# Patient Record
Sex: Male | Born: 1960 | Race: White | Hispanic: No | Marital: Married
Health system: Southern US, Academic
[De-identification: ages and names within clinical notes are randomized; demographics above are authoritative.]

## PROBLEM LIST (undated history)

## (undated) ENCOUNTER — Encounter

## (undated) ENCOUNTER — Ambulatory Visit

## (undated) ENCOUNTER — Ambulatory Visit: Attending: Pharmacist | Primary: Pharmacist

## (undated) ENCOUNTER — Ambulatory Visit: Payer: PRIVATE HEALTH INSURANCE

## (undated) DIAGNOSIS — D649 Anemia, unspecified: Secondary | ICD-10-CM

## (undated) DIAGNOSIS — I82409 Acute embolism and thrombosis of unspecified deep veins of unspecified lower extremity: Secondary | ICD-10-CM

## (undated) DIAGNOSIS — I10 Essential (primary) hypertension: Secondary | ICD-10-CM

---

## 2019-09-11 ENCOUNTER — Ambulatory Visit: Admit: 2019-09-11 | Discharge: 2019-09-12 | Payer: PRIVATE HEALTH INSURANCE

## 2019-09-11 DIAGNOSIS — Z021 Encounter for pre-employment examination: Principal | ICD-10-CM

## 2019-12-08 ENCOUNTER — Ambulatory Visit: Admit: 2019-12-08 | Discharge: 2019-12-09

## 2019-12-08 DIAGNOSIS — Z0189 Encounter for other specified special examinations: Principal | ICD-10-CM

## 2020-05-24 ENCOUNTER — Other Ambulatory Visit: Payer: Self-pay | Admitting: Family Medicine

## 2020-05-24 ENCOUNTER — Inpatient Hospital Stay (HOSPITAL_COMMUNITY): Admission: RE | Admit: 2020-05-24 | Payer: Self-pay | Source: Ambulatory Visit

## 2020-05-24 ENCOUNTER — Other Ambulatory Visit (HOSPITAL_COMMUNITY): Payer: Self-pay | Admitting: Family Medicine

## 2020-05-24 DIAGNOSIS — M79605 Pain in left leg: Secondary | ICD-10-CM

## 2020-05-24 DIAGNOSIS — M79604 Pain in right leg: Secondary | ICD-10-CM

## 2020-05-25 ENCOUNTER — Ambulatory Visit: Admit: 2020-05-25 | Discharge: 2020-05-26 | Payer: PRIVATE HEALTH INSURANCE

## 2020-05-25 DIAGNOSIS — M79661 Pain in right lower leg: Principal | ICD-10-CM

## 2020-05-25 DIAGNOSIS — M79662 Pain in left lower leg: Principal | ICD-10-CM

## 2020-05-25 DIAGNOSIS — M7989 Other specified soft tissue disorders: Principal | ICD-10-CM

## 2020-08-24 DIAGNOSIS — I824Y2 Acute embolism and thrombosis of unspecified deep veins of left proximal lower extremity: Principal | ICD-10-CM

## 2020-08-24 DIAGNOSIS — R600 Localized edema: Principal | ICD-10-CM

## 2020-08-26 ENCOUNTER — Ambulatory Visit: Admit: 2020-08-26 | Discharge: 2020-08-27 | Payer: PRIVATE HEALTH INSURANCE

## 2020-11-05 DIAGNOSIS — I2699 Other pulmonary embolism without acute cor pulmonale: Principal | ICD-10-CM

## 2020-11-05 DIAGNOSIS — R0602 Shortness of breath: Principal | ICD-10-CM

## 2020-11-05 DIAGNOSIS — R600 Localized edema: Principal | ICD-10-CM

## 2020-11-16 ENCOUNTER — Ambulatory Visit: Admit: 2020-11-16 | Discharge: 2020-11-17 | Payer: PRIVATE HEALTH INSURANCE

## 2020-11-16 DIAGNOSIS — Z9289 Personal history of other medical treatment: Principal | ICD-10-CM

## 2020-11-19 DIAGNOSIS — R601 Generalized edema: Principal | ICD-10-CM

## 2020-11-23 ENCOUNTER — Inpatient Hospital Stay (HOSPITAL_COMMUNITY)
Admission: EM | Admit: 2020-11-23 | Discharge: 2020-11-30 | DRG: 291 | Disposition: A | Discharge status: Home / Self Care | Payer: Commercial Managed Care - PPO | Attending: Internal Medicine | Admitting: Internal Medicine

## 2020-11-23 ENCOUNTER — Other Ambulatory Visit: Payer: Self-pay

## 2020-11-23 ENCOUNTER — Encounter (HOSPITAL_COMMUNITY): Payer: Self-pay

## 2020-11-23 ENCOUNTER — Emergency Department (HOSPITAL_COMMUNITY): Payer: Commercial Managed Care - PPO

## 2020-11-23 ENCOUNTER — Ambulatory Visit: Admit: 2020-11-23 | Discharge: 2020-11-24 | Payer: PRIVATE HEALTH INSURANCE

## 2020-11-23 DIAGNOSIS — E785 Hyperlipidemia, unspecified: Secondary | ICD-10-CM | POA: Diagnosis present

## 2020-11-23 DIAGNOSIS — Z7901 Long term (current) use of anticoagulants: Secondary | ICD-10-CM

## 2020-11-23 DIAGNOSIS — D509 Iron deficiency anemia, unspecified: Secondary | ICD-10-CM | POA: Diagnosis present

## 2020-11-23 DIAGNOSIS — D6859 Other primary thrombophilia: Secondary | ICD-10-CM | POA: Diagnosis present

## 2020-11-23 DIAGNOSIS — E8809 Other disorders of plasma-protein metabolism, not elsewhere classified: Secondary | ICD-10-CM | POA: Diagnosis present

## 2020-11-23 DIAGNOSIS — M329 Systemic lupus erythematosus, unspecified: Secondary | ICD-10-CM | POA: Diagnosis not present

## 2020-11-23 DIAGNOSIS — I5033 Acute on chronic diastolic (congestive) heart failure: Secondary | ICD-10-CM | POA: Diagnosis present

## 2020-11-23 DIAGNOSIS — R601 Generalized edema: Secondary | ICD-10-CM | POA: Diagnosis not present

## 2020-11-23 DIAGNOSIS — I509 Heart failure, unspecified: Principal | ICD-10-CM

## 2020-11-23 DIAGNOSIS — M3214 Glomerular disease in systemic lupus erythematosus: Secondary | ICD-10-CM | POA: Diagnosis present

## 2020-11-23 DIAGNOSIS — N179 Acute kidney failure, unspecified: Secondary | ICD-10-CM | POA: Diagnosis present

## 2020-11-23 DIAGNOSIS — Z8249 Family history of ischemic heart disease and other diseases of the circulatory system: Secondary | ICD-10-CM

## 2020-11-23 DIAGNOSIS — J90 Pleural effusion, not elsewhere classified: Secondary | ICD-10-CM | POA: Diagnosis not present

## 2020-11-23 DIAGNOSIS — Z86718 Personal history of other venous thrombosis and embolism: Secondary | ICD-10-CM

## 2020-11-23 DIAGNOSIS — Z20822 Contact with and (suspected) exposure to covid-19: Secondary | ICD-10-CM | POA: Diagnosis present

## 2020-11-23 DIAGNOSIS — D638 Anemia in other chronic diseases classified elsewhere: Secondary | ICD-10-CM | POA: Diagnosis present

## 2020-11-23 DIAGNOSIS — Z79899 Other long term (current) drug therapy: Secondary | ICD-10-CM | POA: Diagnosis not present

## 2020-11-23 DIAGNOSIS — R0602 Shortness of breath: Secondary | ICD-10-CM

## 2020-11-23 DIAGNOSIS — I10 Essential (primary) hypertension: Secondary | ICD-10-CM

## 2020-11-23 DIAGNOSIS — R3129 Other microscopic hematuria: Secondary | ICD-10-CM | POA: Diagnosis present

## 2020-11-23 DIAGNOSIS — I313 Pericardial effusion (noninflammatory): Secondary | ICD-10-CM | POA: Diagnosis present

## 2020-11-23 DIAGNOSIS — Z9889 Other specified postprocedural states: Secondary | ICD-10-CM

## 2020-11-23 DIAGNOSIS — I3139 Other pericardial effusion (noninflammatory): Secondary | ICD-10-CM

## 2020-11-23 DIAGNOSIS — I110 Hypertensive heart disease with heart failure: Secondary | ICD-10-CM | POA: Diagnosis present

## 2020-11-23 DIAGNOSIS — R0600 Dyspnea, unspecified: Secondary | ICD-10-CM

## 2020-11-23 DIAGNOSIS — J918 Pleural effusion in other conditions classified elsewhere: Secondary | ICD-10-CM | POA: Diagnosis present

## 2020-11-23 DIAGNOSIS — R600 Localized edema: Secondary | ICD-10-CM | POA: Diagnosis not present

## 2020-11-23 DIAGNOSIS — D649 Anemia, unspecified: Secondary | ICD-10-CM

## 2020-11-23 HISTORY — DX: Anemia, unspecified: D64.9

## 2020-11-23 HISTORY — DX: Essential (primary) hypertension: I10

## 2020-11-23 HISTORY — DX: Acute embolism and thrombosis of unspecified deep veins of unspecified lower extremity: I82.409

## 2020-11-23 LAB — CBC WITH DIFFERENTIAL/PLATELET
Abs Immature Granulocytes: 0.1 10*3/uL — ABNORMAL HIGH (ref 0.00–0.07)
Basophils Absolute: 0 10*3/uL (ref 0.0–0.1)
Basophils Relative: 0 %
Eosinophils Absolute: 0 10*3/uL (ref 0.0–0.5)
Eosinophils Relative: 0 %
HCT: 24.9 % — ABNORMAL LOW (ref 39.0–52.0)
Hemoglobin: 7.9 g/dL — ABNORMAL LOW (ref 13.0–17.0)
Immature Granulocytes: 1 %
Lymphocytes Relative: 4 %
Lymphs Abs: 0.5 10*3/uL — ABNORMAL LOW (ref 0.7–4.0)
MCH: 27.3 pg (ref 26.0–34.0)
MCHC: 31.7 g/dL (ref 30.0–36.0)
MCV: 86.2 fL (ref 80.0–100.0)
Monocytes Absolute: 1.2 10*3/uL — ABNORMAL HIGH (ref 0.1–1.0)
Monocytes Relative: 9 %
Neutro Abs: 11 10*3/uL — ABNORMAL HIGH (ref 1.7–7.7)
Neutrophils Relative %: 86 %
Platelets: 272 10*3/uL (ref 150–400)
RBC: 2.89 MIL/uL — ABNORMAL LOW (ref 4.22–5.81)
RDW: 14.5 % (ref 11.5–15.5)
WBC: 12.8 10*3/uL — ABNORMAL HIGH (ref 4.0–10.5)
nRBC: 0 % (ref 0.0–0.2)

## 2020-11-23 LAB — COMPREHENSIVE METABOLIC PANEL
ALT: 21 U/L (ref 0–44)
AST: 25 U/L (ref 15–41)
Albumin: 1.7 g/dL — ABNORMAL LOW (ref 3.5–5.0)
Alkaline Phosphatase: 68 U/L (ref 38–126)
Anion gap: 8 (ref 5–15)
BUN: 35 mg/dL — ABNORMAL HIGH (ref 6–20)
CO2: 22 mmol/L (ref 22–32)
Calcium: 7.6 mg/dL — ABNORMAL LOW (ref 8.9–10.3)
Chloride: 103 mmol/L (ref 98–111)
Creatinine, Ser: 1.9 mg/dL — ABNORMAL HIGH (ref 0.61–1.24)
GFR, Estimated: 41 mL/min — ABNORMAL LOW (ref >60)
Glucose, Bld: 129 mg/dL — ABNORMAL HIGH (ref 70–99)
Potassium: 4.1 mmol/L (ref 3.5–5.1)
Sodium: 133 mmol/L — ABNORMAL LOW (ref 135–145)
Total Bilirubin: 0.5 mg/dL (ref 0.3–1.2)
Total Protein: 5.1 g/dL — ABNORMAL LOW (ref 6.5–8.1)

## 2020-11-23 LAB — BRAIN NATRIURETIC PEPTIDE: B Natriuretic Peptide: 490.3 pg/mL — ABNORMAL HIGH (ref 0.0–100.0)

## 2020-11-23 LAB — TROPONIN I (HIGH SENSITIVITY)
Troponin I (High Sensitivity): 33 ng/L — ABNORMAL HIGH (ref <18)
Troponin I (High Sensitivity): 36 ng/L — ABNORMAL HIGH (ref <18)

## 2020-11-23 MED ORDER — FUROSEMIDE 10 MG/ML IJ SOLN
40.0000 mg | Freq: Once | INTRAMUSCULAR | Status: AC
  Administered 2020-11-24: 40 mg via INTRAVENOUS
  Filled 2020-11-23: qty 4

## 2020-11-23 NOTE — ED Triage Notes (Signed)
Pt sent by PCP due to fluid on his lungs that showed up on his ECHO. Pt does endorse some sob and BLE swelling. Pt having DOE.

## 2020-11-23 NOTE — ED Provider Notes (Signed)
Emergency Medicine Provider Triage Evaluation Note  Jeffrie Lofstrom , a 60 y.o. male  was evaluated in triage.  Pt complains of shortness of breath, fluid on lungs.  Pt had an echo today.  Review of Systems  Positive: Short of breath Negative: No fever   Physical Exam  BP (!) 178/103 (BP Location: Right Arm)   Pulse (!) 124   Temp 99.5 F (37.5 C) (Oral)   Resp 19   Ht 6' (1.829 m)   Wt 97.5 kg   SpO2 100%   BMI 29.16 kg/m  Gen:   Awake, no distress  Resp:  Normal effort  MSK:   Moves extremities without difficulty  Other:    Medical Decision Making  Medically screening exam initiated at 3:55 PM.  Appropriate orders placed.  Harrell Niehoff was informed that the remainder of the evaluation will be completed by another provider, this initial triage assessment does not replace that evaluation, and the importance of remaining in the ED until their evaluation is complete.     Elson Areas, [place] 11/23/20 1556    Gwyneth Sprout, MD 11/24/20 1406

## 2020-11-24 ENCOUNTER — Encounter (HOSPITAL_COMMUNITY): Payer: Self-pay | Admitting: Family Medicine

## 2020-11-24 ENCOUNTER — Inpatient Hospital Stay (HOSPITAL_COMMUNITY): Payer: Commercial Managed Care - PPO

## 2020-11-24 DIAGNOSIS — D649 Anemia, unspecified: Secondary | ICD-10-CM

## 2020-11-24 DIAGNOSIS — R600 Localized edema: Secondary | ICD-10-CM

## 2020-11-24 DIAGNOSIS — I509 Heart failure, unspecified: Secondary | ICD-10-CM

## 2020-11-24 DIAGNOSIS — I5033 Acute on chronic diastolic (congestive) heart failure: Secondary | ICD-10-CM

## 2020-11-24 DIAGNOSIS — R0602 Shortness of breath: Secondary | ICD-10-CM

## 2020-11-24 DIAGNOSIS — J90 Pleural effusion, not elsewhere classified: Secondary | ICD-10-CM

## 2020-11-24 DIAGNOSIS — I313 Pericardial effusion (noninflammatory): Secondary | ICD-10-CM

## 2020-11-24 DIAGNOSIS — R0600 Dyspnea, unspecified: Secondary | ICD-10-CM

## 2020-11-24 DIAGNOSIS — N179 Acute kidney failure, unspecified: Secondary | ICD-10-CM

## 2020-11-24 DIAGNOSIS — I3139 Other pericardial effusion (noninflammatory): Secondary | ICD-10-CM

## 2020-11-24 DIAGNOSIS — I10 Essential (primary) hypertension: Secondary | ICD-10-CM

## 2020-11-24 LAB — CBC
HCT: 24.6 % — ABNORMAL LOW (ref 39.0–52.0)
Hemoglobin: 8 g/dL — ABNORMAL LOW (ref 13.0–17.0)
MCH: 27.4 pg (ref 26.0–34.0)
MCHC: 32.5 g/dL (ref 30.0–36.0)
MCV: 84.2 fL (ref 80.0–100.0)
Platelets: 239 10*3/uL (ref 150–400)
RBC: 2.92 MIL/uL — ABNORMAL LOW (ref 4.22–5.81)
RDW: 14.5 % (ref 11.5–15.5)
WBC: 11.2 10*3/uL — ABNORMAL HIGH (ref 4.0–10.5)
nRBC: 0 % (ref 0.0–0.2)

## 2020-11-24 LAB — BASIC METABOLIC PANEL
Anion gap: 11 (ref 5–15)
BUN: 34 mg/dL — ABNORMAL HIGH (ref 6–20)
CO2: 22 mmol/L (ref 22–32)
Calcium: 7.6 mg/dL — ABNORMAL LOW (ref 8.9–10.3)
Chloride: 102 mmol/L (ref 98–111)
Creatinine, Ser: 1.74 mg/dL — ABNORMAL HIGH (ref 0.61–1.24)
GFR, Estimated: 45 mL/min — ABNORMAL LOW (ref >60)
Glucose, Bld: 99 mg/dL (ref 70–99)
Potassium: 3.8 mmol/L (ref 3.5–5.1)
Sodium: 135 mmol/L (ref 135–145)

## 2020-11-24 LAB — URINALYSIS, ROUTINE W REFLEX MICROSCOPIC
Bilirubin Urine: NEGATIVE
Glucose, UA: NEGATIVE mg/dL
Ketones, ur: NEGATIVE mg/dL
Leukocytes,Ua: NEGATIVE
Nitrite: NEGATIVE
Protein, ur: 100 mg/dL — AB
Specific Gravity, Urine: 1.016 (ref 1.005–1.030)
pH: 6 (ref 5.0–8.0)

## 2020-11-24 LAB — ECHOCARDIOGRAM COMPLETE
Area-P 1/2: 4.26 cm2
Height: 72 in
S' Lateral: 3.5 cm
Weight: 3245.17 oz

## 2020-11-24 LAB — IRON AND TIBC
Iron: 15 ug/dL — ABNORMAL LOW (ref 45–182)
Saturation Ratios: 9 % — ABNORMAL LOW (ref 17.9–39.5)
TIBC: 172 ug/dL — ABNORMAL LOW (ref 250–450)
UIBC: 157 ug/dL

## 2020-11-24 LAB — TROPONIN I (HIGH SENSITIVITY)
Troponin I (High Sensitivity): 38 ng/L — ABNORMAL HIGH (ref <18)
Troponin I (High Sensitivity): 34 ng/L — ABNORMAL HIGH (ref <18)

## 2020-11-24 LAB — RESP PANEL BY RT-PCR (FLU A&B, COVID) ARPGX2
Influenza A by PCR: NEGATIVE
Influenza B by PCR: NEGATIVE
SARS Coronavirus 2 by RT PCR: NEGATIVE

## 2020-11-24 LAB — FERRITIN: Ferritin: 1419 ng/mL — ABNORMAL HIGH (ref 24–336)

## 2020-11-24 LAB — C-REACTIVE PROTEIN: CRP: 18.8 mg/dL — ABNORMAL HIGH (ref <1.0)

## 2020-11-24 LAB — HIV ANTIBODY (ROUTINE TESTING W REFLEX): HIV Screen 4th Generation wRfx: NONREACTIVE

## 2020-11-24 LAB — SEDIMENTATION RATE: Sed Rate: 139 mm/hr — ABNORMAL HIGH (ref 0–16)

## 2020-11-24 MED ORDER — HYDROCHLOROTHIAZIDE 12.5 MG PO CAPS
12.5000 mg | ORAL_CAPSULE | Freq: Every day | ORAL | Status: DC
  Administered 2020-11-24: 12.5 mg via ORAL
  Filled 2020-11-24: qty 1

## 2020-11-24 MED ORDER — SODIUM CHLORIDE 0.9% FLUSH: 3.0000 mL | INTRAVENOUS | Status: DC | PRN

## 2020-11-24 MED ORDER — SODIUM CHLORIDE 0.9 % IV SOLN: 250.0000 mL | INTRAVENOUS | Status: DC | PRN

## 2020-11-24 MED ORDER — APIXABAN 5 MG PO TABS
5.0000 mg | ORAL_TABLET | Freq: Two times a day (BID) | ORAL | Status: DC
  Administered 2020-11-24: 5 mg via ORAL
  Filled 2020-11-24 (×2): qty 1

## 2020-11-24 MED ORDER — SODIUM CHLORIDE 0.9% FLUSH
3.0000 mL | Freq: Two times a day (BID) | INTRAVENOUS | Status: DC
  Administered 2020-11-24 – 2020-11-30 (×11): 3 mL via INTRAVENOUS

## 2020-11-24 MED ORDER — COLCHICINE 0.6 MG PO TABS
0.6000 mg | ORAL_TABLET | Freq: Once | ORAL | Status: AC
  Administered 2020-11-24: 0.6 mg via ORAL
  Filled 2020-11-24: qty 1

## 2020-11-24 MED ORDER — FUROSEMIDE 10 MG/ML IJ SOLN
40.0000 mg | Freq: Two times a day (BID) | INTRAMUSCULAR | Status: AC
Start: 2020-11-24 — End: 2020-11-25
  Administered 2020-11-24 – 2020-11-25 (×3): 40 mg via INTRAVENOUS
  Filled 2020-11-24 (×3): qty 4

## 2020-11-24 MED ORDER — ACETAMINOPHEN 325 MG PO TABS
650.0000 mg | ORAL_TABLET | Freq: Four times a day (QID) | ORAL | Status: DC | PRN
  Administered 2020-11-25: 650 mg via ORAL
  Filled 2020-11-24: qty 2

## 2020-11-24 MED ORDER — ACETAMINOPHEN 650 MG RE SUPP: 650.0000 mg | Freq: Four times a day (QID) | RECTAL | Status: DC | PRN

## 2020-11-24 MED ORDER — HEPARIN (PORCINE) 25000 UT/250ML-% IV SOLN
2450.0000 [IU]/h | INTRAVENOUS | Status: AC
  Administered 2020-11-24: 1400 [IU]/h via INTRAVENOUS
  Administered 2020-11-25: 1850 [IU]/h via INTRAVENOUS
  Administered 2020-11-26 (×2): 2100 [IU]/h via INTRAVENOUS
  Administered 2020-11-27 – 2020-11-28 (×3): 2300 [IU]/h via INTRAVENOUS
  Administered 2020-11-28: 2450 [IU]/h via INTRAVENOUS
  Filled 2020-11-24 (×8): qty 250

## 2020-11-24 MED ORDER — LOSARTAN POTASSIUM 50 MG PO TABS
50.0000 mg | ORAL_TABLET | Freq: Every day | ORAL | Status: DC
  Administered 2020-11-24 – 2020-11-25 (×2): 50 mg via ORAL
  Filled 2020-11-24 (×2): qty 1

## 2020-11-24 NOTE — Consult Note (Signed)
Cardiology Consultation:   Patient ID: Gerald Lloyd MRN: [id number]; DOB: April 06, 1961  Admit date: 11/23/2020 Date of Consult: 11/24/2020  Primary Care Provider: Practice, [place] Cardiologist: None  [facility] Electrophysiologist:  None    Patient Profile:   Gerald Lloyd is a 60 y.o. male with a hx of HTN, recent left lower extremity DVT on eliquis who is being seen today for the evaluation of shortness of breath and fatigue found to have a pericardial effusion on echocardiogram at the request of [facility]  History of Present Illness:   [name] is a 60 y/o M with a PMHx of HTN and recent diagnosis of DVT on eliquis who has been feeling short of breath and fatigued for the past 3 weeks. Patient states he was in his usual state of health until 3 weeks ago when he noticed a dry cough that he attributed to allergies. From there, he began having shortness of breath with exertion and fatigue. He has been unable to perform some of his usual tasks such as walking long distances or taking the trash can up and down his driveway due to the severity of his shortness of breath. When he lies flat the shortness of breath worsens, it improves with sitting up at the edge of his bed. He denies every having anything like this before. Does endorse periodic chest tightness but no pain. Denies fever, chills, recent illness, lightheadedness or dizziness, night sweats, abd pain, n/v/d. Does suspect some weight loss, he states he was 215 lbs and recently weighted in clinic to be around 202 lbs.   Since his admission and being given diuretics, he notes improvement in his shortness of breath. He states he was completely healthy until 4 months or so ago when he was found to have a DVT in left lower extremity as well as hypertension, he was placed on eliquis and losartan-hctz combo pill.   Past Medical History:  Diagnosis Date   Anemia    Hypertension      History reviewed. No pertinent surgical history.   Home Medications:  Prior to Admission medications   Medication Sig Start Date End Date Taking? Authorizing Provider  ELIQUIS 5 MG TABS tablet Take 5 mg by mouth 2 (two) times daily. 11/15/20  Yes [provider]  furosemide (LASIX) 40 MG tablet Take 40 mg by mouth daily. 11/18/20  Yes [provider]  losartan-hydrochlorothiazide (HYZAAR) 50-12.5 MG tablet Take 1 tablet by mouth daily. 11/05/20  Yes [provider]    Inpatient Medications: Scheduled Meds:  apixaban  5 mg Oral BID   furosemide  40 mg Intravenous Q12H   hydrochlorothiazide  12.5 mg Oral Daily   losartan  50 mg Oral Daily   sodium chloride flush  3 mL Intravenous Q12H   Continuous Infusions:  sodium chloride     PRN Meds: sodium chloride, acetaminophen **OR** acetaminophen, sodium chloride flush  Allergies:   No Known Allergies  Social History:   Social History   Socioeconomic History   Marital status: Married    Spouse name: Not on file   Number of children: Not on file   Years of education: Not on file   Highest education level: Not on file  Occupational History   Not on file  Tobacco Use   Smoking status: Never   Smokeless tobacco: Never  Substance and Sexual Activity   Alcohol use: Not Currently    Comment: Drinks one beer every few weeks   Drug  use: Never   Sexual activity: Not on file  Other Topics Concern   Not on file  Social History Narrative   Not on file   Social Determinants of Health   Financial Resource Strain: Not on file  Food Insecurity: Not on file  Transportation Needs: Not on file  Physical Activity: Not on file  Stress: Not on file  Social Connections: Not on file  Intimate Partner Violence: Not on file    Family History:   Father has polycythemia, stroke in his 37's Paternal Grandfather collapsed from heart attack in his 92's Mother Breast and colon cancer No siblings  ROS:  Please see  the history of present illness.  All other ROS reviewed and negative.     Physical Exam/Data:   Vitals:   11/24/20 0900 11/24/20 1000 11/24/20 1051 11/24/20 1103  BP: 131/87 124/82  (!) 153/91  Pulse: (!) 110 (!) 103  (!) 105  Resp: (!) 25 (!) 33  20  Temp:   98.8 F (37.1 C) 98.4 F (36.9 C)  TempSrc:   Oral Oral  SpO2: 94% 95%  93%  Weight:    92 kg  Height:    6' (1.829 m)    Intake/Output Summary (Last 24 hours) at 11/24/2020 1702 Last data filed at 11/24/2020 0619 Gross per 24 hour  Intake 0 ml  Output 900 ml  Net -900 ml   Last 3 Weights 11/24/2020 11/24/2020 11/23/2020  Weight (lbs) 202 lb 13.2 oz 220 lb 215 lb  Weight (kg) 92 kg 99.791 kg 97.523 kg     Body mass index is 27.51 kg/m.  General:  well appearing, NAD HEENT: normal Lymph: no adenopathy Neck: no JVD Endocrine:  No thryomegaly Cardiac:  normal S1, S2; RRR; no murmur gallops or rubs. 2+ lower extremity edema to his knees bilaterally Lungs:  clear to auscultation bilaterally, no wheezing, rhonchi or rales  Abd: soft, nontender, no hepatomegaly  Musculoskeletal:  No deformities Skin: warm and dry  Neuro:  CNs 2-12 intact, no focal abnormalities noted Psych:  Normal affect   EKG:  The EKG was personally reviewed and demonstrates:  sinus tachycardia, 105 BPM. Normal PR and QT intervals. Normal axis. No st wave changes.  Telemetry:  Telemetry was personally reviewed and demonstrates:  sinus tachycardia  Relevant CV Studies:  TTE 11/24/20  1. Left ventricular ejection fraction, by estimation, is 55 to 60%. The  left ventricle has normal function. The left ventricle has no regional  wall motion abnormalities. Left ventricular diastolic parameters are  consistent with Grade II diastolic  dysfunction (pseudonormalization). Elevated left ventricular end-diastolic  pressure.   2. Right ventricular systolic function is normal. The right ventricular  size is normal. There is normal pulmonary artery systolic  pressure.   3. Left atrial size was severely dilated.   4. Large pericardial effusion measuring up to 2.3 cm. Right atrial  collapse noted but no right ventricular collapse or significant  respiratory inflow variation. RA pressure 8 mmHg. Findings are consistent  with early tamponade. Recommend serial follow up   and clinical correlation. Large pericardial effusion. The pericardial  effusion is circumferential. There is no evidence of cardiac tamponade.  Moderate pleural effusion in the left lateral region.   5. The mitral valve is normal in structure. No evidence of mitral valve  regurgitation. No evidence of mitral stenosis.   6. The aortic valve is tricuspid. Aortic valve regurgitation is not  visualized. No aortic stenosis is present.   7.  Aortic dilatation noted. There is borderline dilatation of the  ascending aorta, measuring 36 mm.   8. The inferior vena cava is dilated in size with >50% respiratory  variability, suggesting right atrial pressure of 8 mmHg.   9. There is a small patent foramen ovale.   Laboratory Data:  High Sensitivity Troponin:   Recent Labs  Lab 11/23/20 1639 11/23/20 1755 11/24/20 0237 11/24/20 0403  TROPONINIHS 33* 36* 38* 34*     Chemistry Recent Labs  Lab 11/23/20 1639 11/24/20 0237  NA 133* 135  K 4.1 3.8  CL 103 102  CO2 22 22  GLUCOSE 129* 99  BUN 35* 34*  CREATININE 1.90* 1.74*  CALCIUM 7.6* 7.6*  GFRNONAA 41* 45*  ANIONGAP 8 11    Recent Labs  Lab 11/23/20 1639  PROT 5.1*  ALBUMIN 1.7*  AST 25  ALT 21  ALKPHOS 68  BILITOT 0.5   Hematology Recent Labs  Lab 11/23/20 1639 11/24/20 0237  WBC 12.8* 11.2*  RBC 2.89* 2.92*  HGB 7.9* 8.0*  HCT 24.9* 24.6*  MCV 86.2 84.2  MCH 27.3 27.4  MCHC 31.7 32.5  RDW 14.5 14.5  PLT 272 239   BNP Recent Labs  Lab 11/23/20 1639  BNP 490.3*    DDimer No results for input(s): DDIMER in the last 168 hours.   Radiology/Studies:  DG Chest 2 View  Result Date:  11/23/2020 CLINICAL DATA:  Cough for 2 months.  Shortness of breath. EXAM: CHEST - 2 VIEW COMPARISON:  Chest CTA 1 week ago 11/16/2020 at [facility]: Cardiomegaly is similar to prior exam. There are small bilateral pleural effusions that have improved from prior. Associated compressive atelectasis. Improved vascular congestion from prior. Subsegmental atelectasis in the left mid lung zone. No pneumothorax or confluent airspace disease. IMPRESSION: 1. Vascular congestion and pleural effusions have improved from CT 1 week ago. There are small residual pleural effusions with atelectasis. 2. Cardiomegaly is similar. 3. No acute airspace disease. Electronically Signed   By: Keith Rake M.D.   On: 11/23/2020 16:45   ECHOCARDIOGRAM COMPLETE  Result Date: 11/24/2020    ECHOCARDIOGRAM REPORT   Patient Name:   Gerald Lloyd Date of Exam: 11/24/2020 Medical Rec #:  [id number]        Height:       72.0 in Accession #:    [id number]       Weight:       202.8 lb Date of Birth:  03-25-1961        BSA:          2.143 m Patient Age:    76 years         BP:           131/87 mmHg Patient Gender: M                HR:           105 bpm. Exam Location:  Inpatient Procedure: 2D Echo, Cardiac Doppler and Color Doppler Indications:    I50.33 Acute on chronic diastolic (congestive) heart failure  History:        Patient has no prior history of Echocardiogram examinations.                 Risk Factors:Hypertension.  Sonographer:    Jonelle Sidle Dance RVT Referring Phys: [id number] Eben Burow  Sonographer Comments: [place] Cardiologist notified. IMPRESSIONS  1. Left ventricular ejection fraction, by estimation, is 55 to 60%. The left ventricle has  normal function. The left ventricle has no regional wall motion abnormalities. Left ventricular diastolic parameters are consistent with Grade II diastolic dysfunction (pseudonormalization). Elevated left ventricular end-diastolic pressure.  2. Right ventricular systolic  function is normal. The right ventricular size is normal. There is normal pulmonary artery systolic pressure.  3. Left atrial size was severely dilated.  4. Large pericardial effusion measuring up to 2.3 cm. Right atrial collapse noted but no right ventricular collapse or significant respiratory inflow variation. RA pressure 8 mmHg. Findings are consistent with early tamponade. Recommend serial follow up  and clinical correlation. Large pericardial effusion. The pericardial effusion is circumferential. There is no evidence of cardiac tamponade. Moderate pleural effusion in the left lateral region.  5. The mitral valve is normal in structure. No evidence of mitral valve regurgitation. No evidence of mitral stenosis.  6. The aortic valve is tricuspid. Aortic valve regurgitation is not visualized. No aortic stenosis is present.  7. Aortic dilatation noted. There is borderline dilatation of the ascending aorta, measuring 36 mm.  8. The inferior vena cava is dilated in size with >50% respiratory variability, suggesting right atrial pressure of 8 mmHg.  9. There is a small patent foramen ovale. FINDINGS  Left Ventricle: Left ventricular ejection fraction, by estimation, is 55 to 60%. The left ventricle has normal function. The left ventricle has no regional wall motion abnormalities. The left ventricular internal cavity size was normal in size. There is  no left ventricular hypertrophy. Left ventricular diastolic parameters are consistent with Grade II diastolic dysfunction (pseudonormalization). Elevated left ventricular end-diastolic pressure. Right Ventricle: The right ventricular size is normal. No increase in right ventricular wall thickness. Right ventricular systolic function is normal. There is normal pulmonary artery systolic pressure. The tricuspid regurgitant velocity is 1.72 m/s, and  with an assumed right atrial pressure of 8 mmHg, the estimated right ventricular systolic pressure is 47.8 mmHg. Left Atrium:  Left atrial size was severely dilated. Right Atrium: Right atrial size was normal in size. Pericardium: Large pericardial effusion measuring up to 2.3 cm. Right atrial collapse noted but no right ventricular collapse or significant respiratory inflow variation. RA pressure 8 mmHg. Findings are consistent with early tamponade. Recommend serial follow up and clinical correlation. A large pericardial effusion is present. The pericardial effusion is circumferential. There is diastolic collapse of the right atrial wall. There is no evidence of cardiac tamponade. Mitral Valve: The mitral valve is normal in structure. No evidence of mitral valve regurgitation. No evidence of mitral valve stenosis. Tricuspid Valve: The tricuspid valve is normal in structure. Tricuspid valve regurgitation is trivial. No evidence of tricuspid stenosis. Aortic Valve: The aortic valve is tricuspid. Aortic valve regurgitation is not visualized. No aortic stenosis is present. Pulmonic Valve: The pulmonic valve was normal in structure. Pulmonic valve regurgitation is not visualized. No evidence of pulmonic stenosis. Aorta: Aortic dilatation noted. There is borderline dilatation of the ascending aorta, measuring 36 mm. Venous: The inferior vena cava is dilated in size with greater than 50% respiratory variability, suggesting right atrial pressure of 8 mmHg. IAS/Shunts: No atrial level shunt detected by color flow Doppler. A small patent foramen ovale is detected. Additional Comments: There is a moderate pleural effusion in the left lateral region.  LEFT VENTRICLE PLAX 2D LVIDd:         5.30 cm  Diastology LVIDs:         3.50 cm  LV e' medial:    7.07 cm/s LV PW:  1.06 cm  LV E/e' medial:  17.7 LV IVS:        1.20 cm  LV e' lateral:   6.85 cm/s LVOT diam:     2.10 cm  LV E/e' lateral: 18.2 LV SV:         101 LV SV Index:   47 LVOT Area:     3.46 cm  RIGHT VENTRICLE             IVC RV Basal diam:  3.00 cm     IVC diam: 2.00 cm RV Mid diam:     1.60 cm RV S prime:     15.70 cm/s TAPSE (M-mode): 1.6 cm LEFT ATRIUM              Index       RIGHT ATRIUM           Index LA diam:        5.20 cm  2.43 cm/m  RA Area:     16.90 cm LA Vol (A2C):   133.0 ml 62.05 ml/m RA Volume:   44.80 ml  20.90 ml/m LA Vol (A4C):   82.6 ml  38.54 ml/m LA Biplane Vol: 109.0 ml 50.86 ml/m  AORTIC VALVE LVOT Vmax:   173.00 cm/s LVOT Vmean:  121.000 cm/s LVOT VTI:    0.293 m  AORTA Ao Root diam: 3.50 cm Ao Asc diam:  3.60 cm MITRAL VALVE                TRICUSPID VALVE MV Area (PHT): 4.26 cm     TR Peak grad:   11.9 mmHg MV Decel Time: 178 msec     TR Vmax:        172.39 cm/s MV E velocity: 125.00 cm/s MV A velocity: 90.80 cm/s   SHUNTS MV E/A ratio:  1.38         Systemic VTI:  0.29 m                             Systemic Diam: 2.10 cm Skeet Latch MD Electronically signed by Skeet Latch MD Signature Date/Time: 11/24/2020/2:42:36 PM    Final      Assessment and Plan:   Pericardial Effusion Patient with shortness of breath and fatigue the last 3 weeks or so, found to have moderate pericardial effusion on echocardiogram. Patient has remained hemodynamically stable and is without muffled heart sounds or significant JVD, low suspicion for acute tamponade at this time. Likely a slow accumulation of fluid. He does have significant anasarca on exam. Do not believe he needs urgent pericardiocentesis at this time. While he has been on eliquis, I have a low suspicion that this is a bloody effusion. With his recent DVT, low albumin and anemia, I suspect an inflammatory process is the underlying etiology to his effusion. Will continue lasix at this time and monitor fluid status as well as discontinue his eliquis and start heparin. If fluid sample is needed, can consider sampling pleural effusions with thoracentesis.  -continue lasix daily -daily weights and strict I/O's -dc eliquis and start heparin -if becomes hemodynamically unstable, hypotensive or with worsening  dyspnea, consider urgent pericardiocentesis   Acute Kidney Injury Uncertain of baseline Cr, ranged from 1.4-1.9 over past few days. Possibly secondary to his congestion, will see if he has improvement with diuresis.  -continue lasix -discontinue HCTZ -UA pending  LLE DVT Patient diagnosed with DVT 4 months ago and has  been on eliquis since. Uncertain as to etiology of DVT but with overall picture there is concern for inflammatory/malignant process causing his hypercoagulable state. Significant family history of malignancy, father with polycythemia and mother with breast and colon cancer.  -discontinue eliquis and start heparin per pharmacy -CRP/ESR ordered  Hypoalbuminemia  Albumin of 1.7 on admission, likely contributing to his anasarca, moderate pleural effusions, and pericardial effusion. Inflammatory workup pending  Normocytic Anemia Hgb of 8.0. MCV of 84.2. Iron panel and ferritin pending, suspect inflammatory etiology. Low suspicion for acute bleed.  -iron studies pending  Solid Subpleural Opacity Right Upper Lobe Patchy Left Upper Lobe Opacity On recent CTA 11/16/20 patient found to have opacity in left upper lobe and subpleural sub solid opacity in right upper lobe. Infectious vs inflammatory vs edematous process. Patient will need close follow up.  New York Heart Association (NYHA) Functional Class NYHA Class II   Please await attending attestation for final recommendations  For questions or updates, please contact [facility] Please consult [email or web address] for contact info under   Signed, [place]  Internal Medicine Resident PGY-2 [place]  Pager: [phone]

## 2020-11-24 NOTE — ED Notes (Signed)
Attempted to give reportx1 

## 2020-11-24 NOTE — ED Provider Notes (Signed)
[facility] EMERGENCY DEPARTMENT Provider Note   CSN: [id number] Arrival date & time: 11/23/20  1537     History Chief Complaint  Patient presents with   Shortness of Breath   Leg Swelling    Gerald Lloyd is a 60 y.o. male presenting for evaluation of shortness of breath.  Patient states he has had issues with shortness of breath for about a month.  This is being worked up by his primary care doctor, had an outpatient echo today which showed a moderate size pericardial effusion.  Patient states symptoms began when he was first diagnosed with a DVT, he started on Eliquis at that time.  Since then, he has had a CTA (11/16/2020) negative for PE but did show pleural effusions.  He does not have a cardiologist.  Prior to a month ago, he had no medical problems, and did not take any medications daily.  He reports waking up from sleep coughing.  He denies a history of heart failure, however states he was just recently started on furosemide, has been on this for a week.  He is also on losartan and HCTZ and Eliquis.  He has been tested for COVID since his symptoms began and it was negative.  He denies fevers, cough, chest pain, nausea, vomit, abdominal pain.  He reports good urine output, this has been unchanged since starting the Lasix.  He reports worsening leg swelling.  No hematuria, melena, hematochezia.  Additional history obtained from chart review.  I reviewed patient's recent echo and CTA.  HPI     Past Medical History:  Diagnosis Date   Anemia    Hypertension     Patient Active Problem List   Diagnosis Date Noted   AKI (acute kidney injury) (HCC) 11/24/2020   Pericardial effusion 11/24/2020   Anemia 11/24/2020   Dyspnea 11/24/2020   Essential hypertension 11/24/2020   Acute CHF (congestive heart failure) (HCC) 11/23/2020    History reviewed. No pertinent surgical history.     History reviewed. No pertinent family history.  Social History    Tobacco Use   Smoking status: Never   Smokeless tobacco: Never  Substance Use Topics   Alcohol use: Not Currently    Comment: Drinks one beer every few weeks   Drug use: Never    Home Medications Prior to Admission medications   Medication Sig Start Date End Date Taking? Authorizing Provider  ELIQUIS 5 MG TABS tablet Take 5 mg by mouth 2 (two) times daily. 11/15/20  Yes [provider]  furosemide (LASIX) 40 MG tablet Take 40 mg by mouth daily. 11/18/20  Yes [provider]  losartan-hydrochlorothiazide (HYZAAR) 50-12.5 MG tablet Take 1 tablet by mouth daily. 11/05/20  Yes [provider]    Allergies    Patient has no known allergies.  Review of Systems   Review of Systems  Respiratory:  Positive for shortness of breath.   Cardiovascular:  Positive for leg swelling.  Hematological:  Bruises/bleeds easily.  All other systems reviewed and are negative.  Physical Exam Updated Vital Signs BP (!) 150/117   Pulse (!) 102   Temp 99.5 F (37.5 C) (Oral)   Resp (!) 25   Ht 6' (1.829 m)   Wt 97.5 kg   SpO2 100%   BMI 29.16 kg/m   Physical Exam Vitals and nursing note reviewed.  Constitutional:      General: He is not in acute distress.    Appearance: Normal appearance.  Comments: Appears pale, not in acute distress  HENT:     Head: Normocephalic and atraumatic.  Eyes:     Conjunctiva/sclera: Conjunctivae normal.     Pupils: Pupils are equal, round, and reactive to light.  Cardiovascular:     Rate and Rhythm: Regular rhythm. Tachycardia present.     Pulses: Normal pulses.     Comments: Tachycardic around 115 Pulmonary:     Effort: Pulmonary effort is normal. No respiratory distress.     Breath sounds: Normal breath sounds. No wheezing.     Comments: Speaking in full sentences.  Clear lung sounds in all fields.  Patient becomes very tachypneic with very little exertion (such as speaking.) Abdominal:     General: There is no distension.      Palpations: Abdomen is soft.     Tenderness: There is no abdominal tenderness.  Musculoskeletal:        General: Normal range of motion.     Cervical back: Normal range of motion and neck supple.     Right lower leg: Edema present.     Left lower leg: Edema present.  Skin:    General: Skin is warm and dry.     Capillary Refill: Capillary refill takes less than 2 seconds.     Coloration: Skin is pale.  Neurological:     Mental Status: He is alert and oriented to person, place, and time.  Psychiatric:        Mood and Affect: Mood and affect normal.        Speech: Speech normal.        Behavior: Behavior normal.    ED Results / Procedures / Treatments   Labs (all labs ordered are listed, but only abnormal results are displayed) Labs Reviewed  CBC WITH DIFFERENTIAL/PLATELET - Abnormal; Notable for the following components:      Result Value   WBC 12.8 (*)    RBC 2.89 (*)    Hemoglobin 7.9 (*)    HCT 24.9 (*)    Neutro Abs 11.0 (*)    Lymphs Abs 0.5 (*)    Monocytes Absolute 1.2 (*)    Abs Immature Granulocytes 0.10 (*)    All other components within normal limits  COMPREHENSIVE METABOLIC PANEL - Abnormal; Notable for the following components:   Sodium 133 (*)    Glucose, Bld 129 (*)    BUN 35 (*)    Creatinine, Ser 1.90 (*)    Calcium 7.6 (*)    Total Protein 5.1 (*)    Albumin 1.7 (*)    GFR, Estimated 41 (*)    All other components within normal limits  BRAIN NATRIURETIC PEPTIDE - Abnormal; Notable for the following components:   B Natriuretic Peptide 490.3 (*)    All other components within normal limits  TROPONIN I (HIGH SENSITIVITY) - Abnormal; Notable for the following components:   Troponin I (High Sensitivity) 33 (*)    All other components within normal limits  TROPONIN I (HIGH SENSITIVITY) - Abnormal; Notable for the following components:   Troponin I (High Sensitivity) 36 (*)    All other components within normal limits  RESP PANEL BY RT-PCR (FLU A&B,  COVID) ARPGX2  HIV ANTIBODY (ROUTINE TESTING W REFLEX)  BASIC METABOLIC PANEL  CBC  TROPONIN I (HIGH SENSITIVITY)    EKG None  Radiology DG Chest 2 View  Result Date: 11/23/2020 CLINICAL DATA:  Cough for 2 months.  Shortness of breath. EXAM: CHEST - 2 VIEW COMPARISON:  Chest CTA 1 week ago 11/16/2020 at [facility] FINDINGS: Cardiomegaly is similar to prior exam. There are small bilateral pleural effusions that have improved from prior. Associated compressive atelectasis. Improved vascular congestion from prior. Subsegmental atelectasis in the left mid lung zone. No pneumothorax or confluent airspace disease. IMPRESSION: 1. Vascular congestion and pleural effusions have improved from CT 1 week ago. There are small residual pleural effusions with atelectasis. 2. Cardiomegaly is similar. 3. No acute airspace disease. Electronically Signed   By: Narda Rutherford M.D.   On: 11/23/2020 16:45    Procedures Procedures   Medications Ordered in ED Medications  furosemide (LASIX) injection 40 mg (has no administration in time range)  apixaban (ELIQUIS) tablet 5 mg (has no administration in time range)  losartan (COZAAR) tablet 50 mg (has no administration in time range)  hydrochlorothiazide (MICROZIDE) capsule 12.5 mg (has no administration in time range)  sodium chloride flush (NS) 0.9 % injection 3 mL (has no administration in time range)  sodium chloride flush (NS) 0.9 % injection 3 mL (has no administration in time range)  0.9 %  sodium chloride infusion (has no administration in time range)  acetaminophen (TYLENOL) tablet 650 mg (has no administration in time range)    Or  acetaminophen (TYLENOL) suppository 650 mg (has no administration in time range)  colchicine tablet 0.6 mg (has no administration in time range)  furosemide (LASIX) injection 40 mg (40 mg Intravenous Given 11/24/20 0026)    ED Course  I have reviewed the triage vital signs and the nursing notes.  Pertinent labs &  imaging results that were available during my care of the patient were reviewed by me and considered in my medical decision making (see chart for details).    MDM Rules/Calculators/A&P                           Patient presented for evaluation of shortness of breath and abnormal echo.  On exam, patient is tachycardic and tachypneic.  Appears pale.  However not in acute distress at this time.  He does have evidence of peripheral edema.  Labs obtained from triage interpreted by me, shows worsening kidney function and anemia of 7.9.  No recent labs to compare other than a creatinine of 1.4 a week ago.  Shows elevated BNP and mildly elevated troponin, likely due to demand.  Chest x-ray viewed and independently interpreted by me, shows small pleural effusions, per radiology improved from previous.  In the setting of evidence of heart failure, continued symptoms, and a moderate sized pleural effusion on echo today, patient will likely need to be admitted for further evaluation.  He will need close monitoring of his hemoglobin and kidney function.  Will consult with cardiology.  Discussed with Dr. Joyce Gross from cardiology who recommends repeating an echo and admission to medicine.  Recommend starting IV Lasix at 40 mg.  Discussed with Dr. Rachael Darby from [facility] hospitalist service, patient to be admitted  Final Clinical Impression(s) / ED Diagnoses Final diagnoses:  Congestive heart failure, unspecified HF chronicity, unspecified heart failure type [facility])  Pericardial effusion    Rx / DC Orders ED Discharge Orders     None        Alveria Apley, PA-C 11/24/20 0237    Melene Plan, DO 11/24/20 [phone]

## 2020-11-24 NOTE — H&P (Signed)
History and Physical    Gerald Lloyd LDJ:[id number] DOB: [birth year] DOA: 11/23/2020  PCP: [facility]   Patient coming from: Home  Chief Complaint: SOB, swelling, pericardial effusion  HPI: Gerald Lloyd is a 60 y.o. male with medical history significant for HTN and history of DVT 4 months ago and on Eliquis who presents for evaluation of shortness of breath and pericardial effusion.  Patient was seen by his doctor at [facility] and had a CT scan of the chest and echocardiogram which showed moderate pericardial effusion.  He was then sent to [facility] to be further evaluated by cardiology.  He reports he has been having symptoms for the last 2 to 3 weeks of shortness of breath and fatigue.  He is also had orthopnea and has not been sleeping well for the last few weeks.  He denies having any chest pain, nausea, vomiting, diarrhea, abdominal pain, urinary symptoms.  He reports he had a blood clot in his leg 4 months ago and has been on Eliquis since then.  He reports 2 weeks ago he started having swelling in both of his legs and his wife finally convinced him to go to the doctor last week.  During the work-up he had a CT scan and echocardiogram on 11/23/20 concerning for HF and pericardial effusion.  He has never had heart problems in the past he states. Lives with his wife.  Drinks 1 beer every 2 to 3 weeks.  Denies tobacco or illicit drug use.  ED Course: Gerald Lloyd has been hemodynamically stable with mild elevated blood pressure in the emergency room.  Lab work reveals a BNP of 490.3 with initial troponin of 33 and repeat troponin of 36.  WBC 12,800 hemoglobin 7.9 hematocrit 24.9 platelets 272,000.  Sodium 133 potassium 4.1 chloride 103 bicarb 22 creatinine 1.90 BUN 35 alkaline phosphatase 68 AST 25 ALT 21 albumin 1.7 calcium 7.6 with corrected calcium 9.6.  ER PA discussed with cardiology recommended repeating echocardiogram in the morning and then call cardiology  for consult.  Hospitalist service asked to admit for further management  Review of Systems:  General: Denies fever, chills, weight loss, night sweats.  Denies dizziness.  Denies change in appetite HENT: Denies head trauma, headache, denies change in hearing, tinnitus.  Denies nasal congestion.  Denies sore throat.  Denies difficulty swallowing Eyes: Denies blurry vision, pain in eye, drainage.  Denies discoloration of eyes. Neck: Denies pain.  Denies swelling.  Denies pain with movement. Cardiovascular: Denies chest pain, palpitations. Reports edema and orthopnea Respiratory: Reports shortness of breath. Denies cough.  Denies wheezing.  Denies sputum production Gastrointestinal: Denies abdominal pain, swelling.  Denies nausea, vomiting, diarrhea.  Denies melena.  Denies hematemesis. Musculoskeletal: Denies limitation of movement.  Denies deformity or swelling.  Denies pain.  Denies arthralgias or myalgias. Genitourinary: Denies pelvic pain.  Denies urinary frequency or hesitancy.  Denies dysuria.  Skin: Denies rash.  Denies petechiae, purpura, ecchymosis. Neurological: Denies syncope. Denies seizure activity. Denies paresthesia.  Denies slurred speech, drooping face.  Denies visual change. Psychiatric: Denies depression, anxiety.  Denies hallucinations.  Past Medical History:  Diagnosis Date   Anemia    Hypertension     History reviewed. No pertinent surgical history.  Social History  reports that he has never smoked. He has never used smokeless tobacco. He reports that he does not currently use alcohol. He reports that he does not use drugs.  Not on File  History reviewed. No pertinent family history.  Prior to Admission medications   Not on File    Physical Exam: Vitals:   11/23/20 2136 11/23/20 2245 11/23/20 2300 11/23/20 2315  BP: (!) 159/92 (!) 177/116 (!) 163/107 (!) 182/102  Pulse: (!) 112 (!) 112 (!) 111 (!) 112  Resp: (!) 22 (!) 23 (!) 26 (!) 27  Temp:       TempSrc:      SpO2: 99% 98% 100% 99%  Weight:      Height:        Constitutional: NAD, calm, comfortable Vitals:   11/23/20 2136 11/23/20 2245 11/23/20 2300 11/23/20 2315  BP: (!) 159/92 (!) 177/116 (!) 163/107 (!) 182/102  Pulse: (!) 112 (!) 112 (!) 111 (!) 112  Resp: (!) 22 (!) 23 (!) 26 (!) 27  Temp:      TempSrc:      SpO2: 99% 98% 100% 99%  Weight:      Height:       General: WDWN, Alert and oriented x3.  Eyes: EOMI, PERRL, conjunctivae normal.  Sclera nonicteric HENT:  /AT, external ears normal.  Nares patent without epistasis.  Mucous membranes are moist. Posterior pharynx clear  Neck: Soft, normal range of motion, supple, no masses, no thyromegaly.  Trachea midline Respiratory: clear to auscultation bilaterally, no wheezing, no crackles. Normal respiratory effort. No accessory muscle use.  Cardiovascular: Regular rate and rhythm, no murmurs / rubs / gallops. Mild lower extremity edema. 2+ pedal pulses. Abdomen: Soft, no tenderness, nondistended, no rebound or guarding.  No masses palpated. Bowel sounds normoactive Musculoskeletal: FROM. No cyanosis. No joint deformity upper and lower extremities. Normal muscle tone.  Skin: Warm, dry, intact no rashes, lesions, ulcers. No induration Neurologic: CN 2-12 grossly intact.  Normal speech.  Sensation intact to touch. Strength 5/5 in all extremities.   Psychiatric: Normal judgment and insight.  Normal mood.    Labs on Admission: I have personally reviewed following labs and imaging studies  CBC: Recent Labs  Lab 11/23/20 1639  WBC 12.8*  NEUTROABS 11.0*  HGB 7.9*  HCT 24.9*  MCV 86.2  PLT 272    Basic Metabolic Panel: Recent Labs  Lab 11/23/20 1639  NA 133*  K 4.1  CL 103  CO2 22  GLUCOSE 129*  BUN 35*  CREATININE 1.90*  CALCIUM 7.6*    GFR: Estimated Creatinine Clearance: 51.9 mL/min (A) (by C-G formula based on SCr of 1.9 mg/dL (H)).  Liver Function Tests: Recent Labs  Lab 11/23/20 1639  AST 25   ALT 21  ALKPHOS 68  BILITOT 0.5  PROT 5.1*  ALBUMIN 1.7*    Urine analysis: No results found for: COLORURINE, APPEARANCEUR, LABSPEC, PHURINE, GLUCOSEU, HGBUR, BILIRUBINUR, KETONESUR, PROTEINUR, UROBILINOGEN, NITRITE, LEUKOCYTESUR  Radiological Exams on Admission: DG Chest 2 View  Result Date: 11/23/2020 CLINICAL DATA:  Cough for 2 months.  Shortness of breath. EXAM: CHEST - 2 VIEW COMPARISON:  Chest CTA 1 week ago 11/16/2020 at [facility] FINDINGS: Cardiomegaly is similar to prior exam. There are small bilateral pleural effusions that have improved from prior. Associated compressive atelectasis. Improved vascular congestion from prior. Subsegmental atelectasis in the left mid lung zone. No pneumothorax or confluent airspace disease. IMPRESSION: 1. Vascular congestion and pleural effusions have improved from CT 1 week ago. There are small residual pleural effusions with atelectasis. 2. Cardiomegaly is similar. 3. No acute airspace disease. Electronically Signed   By: Narda Rutherford M.D.   On: 11/23/2020 16:45    EKG: Independently reviewed.  EKG shows  sinus tachycardia with nonspecific ST changes but no acute ST elevation or depression.  QTc 467  Assessment/Plan Principal Problem:   Acute CHF (congestive heart failure)  Gerald Lloyd is admitted to cardiac telemetry floor.  Obtain echocardiogram to evaluate EF, wall motion and valvular function as well as pericardial effusion on echocardiogram from an outside facility. Diurese with Lasix twice a day.  Monitor daily weights and I&Os.  Check serial troponins overnight Consult cardiology in the morning after echocardiogram Continue losartan and HCTZ therapy  Active Problems:   AKI (acute kidney injury) Has elevated Creatinine to 1.9.  Recheck renal function and electrolytes in am.     Pericardial effusion Moderate pericardial effusion on echo done in [place] on 11/23/20.  Obtain echo in am per cardiology  recommendation. Diuresis with lasix.  Given dose of colchicine tonight as has been shown to help inflammatory pericardial effusion. NSAID not used with AKI.     Dyspnea Supplemental oxygen as needed to keep O2 sat 92-96%    Essential hypertension Continue hyzaar. Monitor BP    Anemia Recheck CBC in am    DVT prophylaxis: Patient is anticoagulated on Eliquis for DVT that was diagnosed 3-4 months ago.  Continue Eliquis Code Status:   Full code Family Communication:  Diagnosis and plan discussed with patient and his wife who is at bedside.  They verbalized understanding and agree with plan.  Further recommendations to follow as clinical indicated Disposition Plan:   Patient is from:  Home  Anticipated DC to:  Home  Anticipated DC date:  Anticipate 2 midnight or more stay in the hospital to treat acute condition  Consults called:  Case discussed with Cardiology by ER PA, cardiology said to get echo in am and then call them back in am  Admission status:  Inpatient   Gerald Severance Brayant Dorr MD [facility]  How to contact the [facility] Attending or Consulting provider 7A - 7P or covering provider during after hours 7P -7A, for this patient?   Check the care team in [facility] and look for a) attending/consulting [facility] provider listed and b) the [facility] team listed Log into [email or web address] and use [facility]'s universal password to access. If you do not have the password, please contact the hospital operator. Locate the [facility] provider you are looking for under [facility] and page to a number that you can be directly reached. If you still have difficulty reaching the provider, please page the [facility] (Director on Call) for the Hospitalists listed on amion for assistance.  11/24/2020, 12:21 AM

## 2020-11-24 NOTE — Progress Notes (Addendum)
ANTICOAGULATION CONSULT NOTE - Initial Consult  Pharmacy Consult for heparin Indication: LLE popliteal DVT 05/26/20  No Known Allergies  Patient Measurements: Height: 6' (182.9 cm) Weight: 92 kg (202 lb 13.2 oz) IBW/kg (Calculated) : 77.6 Heparin Dosing Weight: 92kg  Vital Signs: Temp: 98.4 F (36.9 C) (08/17 1103) Temp Source: Oral (08/17 1103) BP: 153/91 (08/17 1103) Pulse Rate: 105 (08/17 1103)  Labs: Recent Labs    11/23/20 1639 11/23/20 1755 11/24/20 0237 11/24/20 0403  HGB 7.9*  --  8.0*  --   HCT 24.9*  --  24.6*  --   PLT 272  --  239  --   CREATININE 1.90*  --  1.74*  --   TROPONINIHS 33* 36* 38* 34*    Estimated Creatinine Clearance: 51.4 mL/min (A) (by C-G formula based on SCr of 1.74 mg/dL (H)).   Medical History: Past Medical History:  Diagnosis Date   Anemia    Hypertension      Assessment: 60 yo M with hx LLE popliteal DVT 05/26/20 on apixaban PTA. Apixaban held for possible procedure, last dose 8/17 @ 3:30am.  Pharmacy consulted for heparin.    ClCr 50 ml/min. Anemic but stable.   Goal of Therapy:  Heparin level 0.3-0.7 units/ml Monitor platelets by anticoagulation protocol: Yes   Plan:  Heparin 1400 units/hr, no bolus F/u 8hr APTT/HL F/u aPTT until correlates with heparin level  Monitor daily aPTT, HL, CBC/plt Monitor for signs/symptoms of bleeding    Alphia Moh, PharmD, BCPS, BCCP Clinical Pharmacist  Please check AMION for all [facility] phone numbers After 10:00 PM, call Main Pharmacy [phone]

## 2020-11-24 NOTE — Progress Notes (Signed)
  Echocardiogram 2D Echocardiogram has been performed. [place] cardiologist notified.  Menaal Russum G Dakarai Mcglocklin 11/24/2020, 2:09 PM

## 2020-11-24 NOTE — ED Notes (Signed)
Pt sitting in recliner chair, eating breakfast 

## 2020-11-24 NOTE — Progress Notes (Addendum)
Patient seen and examined at the bedside.  His wife was at the bedside.  Echo tech was also at the bedside.  He feels that his breathing is better today.  His leg swelling is also improving.  Reviewed 2D echo from [facility] healthcare.  Repeat 2D echocardiogram today showed EF estimated at 55 to 60%, severely dilated left atrium, grade 2 diastolic dysfunction, large pericardial effusion without evidence of cardiac tamponade.  Presence of moderate left pleural effusion.  Consulted cardiologist to assist with management. Continue IV Lasix.  Plan of care was discussed with the patient and his wife at the bedside.

## 2020-11-25 ENCOUNTER — Inpatient Hospital Stay (HOSPITAL_COMMUNITY): Payer: Commercial Managed Care - PPO

## 2020-11-25 DIAGNOSIS — R601 Generalized edema: Secondary | ICD-10-CM

## 2020-11-25 DIAGNOSIS — I10 Essential (primary) hypertension: Secondary | ICD-10-CM

## 2020-11-25 DIAGNOSIS — J90 Pleural effusion, not elsewhere classified: Secondary | ICD-10-CM | POA: Diagnosis present

## 2020-11-25 HISTORY — PX: IR THORACENTESIS ASP PLEURAL SPACE W/IMG GUIDE: IMG5380

## 2020-11-25 LAB — BODY FLUID CELL COUNT WITH DIFFERENTIAL
Lymphs, Fluid: 8 %
Monocyte-Macrophage-Serous Fluid: 6 % — ABNORMAL LOW (ref 50–90)
Neutrophil Count, Fluid: 86 % — ABNORMAL HIGH (ref 0–25)
Total Nucleated Cell Count, Fluid: 1555 cu mm — ABNORMAL HIGH (ref 0–1000)

## 2020-11-25 LAB — PROTEIN / CREATININE RATIO, URINE
Creatinine, Urine: 41.41 mg/dL
Protein Creatinine Ratio: 1.69 mg/mg{Cre} — ABNORMAL HIGH (ref 0.00–0.15)
Total Protein, Urine: 70 mg/dL

## 2020-11-25 LAB — LACTATE DEHYDROGENASE, PLEURAL OR PERITONEAL FLUID: LD, Fluid: 223 U/L — ABNORMAL HIGH (ref 3–23)

## 2020-11-25 LAB — APTT
aPTT: 39 seconds — ABNORMAL HIGH (ref 24–36)
aPTT: 45 seconds — ABNORMAL HIGH (ref 24–36)
aPTT: 51 seconds — ABNORMAL HIGH (ref 24–36)

## 2020-11-25 LAB — HEPATITIS PANEL, ACUTE
HCV Ab: NONREACTIVE
Hep A IgM: NONREACTIVE
Hep B C IgM: NONREACTIVE
Hepatitis B Surface Ag: NONREACTIVE

## 2020-11-25 LAB — PROTEIN, PLEURAL OR PERITONEAL FLUID: Total protein, fluid: <3 g/dL

## 2020-11-25 LAB — GLUCOSE, PLEURAL OR PERITONEAL FLUID: Glucose, Fluid: 88 mg/dL

## 2020-11-25 LAB — LIPID PANEL
Cholesterol: 201 mg/dL — ABNORMAL HIGH (ref 0–200)
HDL: 22 mg/dL — ABNORMAL LOW (ref >40)
LDL Cholesterol: 151 mg/dL — ABNORMAL HIGH (ref 0–99)
Total CHOL/HDL Ratio: 9.1 RATIO
Triglycerides: 140 mg/dL (ref <150)
VLDL: 28 mg/dL (ref 0–40)

## 2020-11-25 LAB — HIV ANTIBODY (ROUTINE TESTING W REFLEX): HIV Screen 4th Generation wRfx: NONREACTIVE

## 2020-11-25 LAB — BASIC METABOLIC PANEL
Anion gap: 9 (ref 5–15)
BUN: 35 mg/dL — ABNORMAL HIGH (ref 6–20)
CO2: 23 mmol/L (ref 22–32)
Calcium: 7.6 mg/dL — ABNORMAL LOW (ref 8.9–10.3)
Chloride: 104 mmol/L (ref 98–111)
Creatinine, Ser: 1.54 mg/dL — ABNORMAL HIGH (ref 0.61–1.24)
GFR, Estimated: 52 mL/min — ABNORMAL LOW (ref >60)
Glucose, Bld: 108 mg/dL — ABNORMAL HIGH (ref 70–99)
Potassium: 3.6 mmol/L (ref 3.5–5.1)
Sodium: 136 mmol/L (ref 135–145)

## 2020-11-25 LAB — MAGNESIUM: Magnesium: 1.6 mg/dL — ABNORMAL LOW (ref 1.7–2.4)

## 2020-11-25 LAB — PROTEIN, URINE, RANDOM: Total Protein, Urine: 70 mg/dL

## 2020-11-25 LAB — LACTATE DEHYDROGENASE: LDH: 226 U/L — ABNORMAL HIGH (ref 98–192)

## 2020-11-25 LAB — HEPARIN LEVEL (UNFRACTIONATED): Heparin Unfractionated: 0.78 IU/mL — ABNORMAL HIGH (ref 0.30–0.70)

## 2020-11-25 MED ORDER — LIDOCAINE HCL 1 % IJ SOLN
INTRAMUSCULAR | Status: AC
  Filled 2020-11-25: qty 20

## 2020-11-25 MED ORDER — MAGNESIUM SULFATE 2 GM/50ML IV SOLN
2.0000 g | Freq: Once | INTRAVENOUS | Status: AC
  Administered 2020-11-25: 2 g via INTRAVENOUS
  Filled 2020-11-25: qty 50

## 2020-11-25 MED ORDER — LIDOCAINE HCL (PF) 1 % IJ SOLN
INTRAMUSCULAR | Status: AC | PRN
  Administered 2020-11-25 – 2020-11-26 (×2): 10 mL

## 2020-11-25 NOTE — Progress Notes (Addendum)
Progress Note    Gerald Lloyd  JGG:[id number] DOB: 1960-07-28  DOA: 11/23/2020 PCP: [facility]      Brief Narrative:    Medical records reviewed and are as summarized below:  Gerald Lloyd is a 60 y.o. 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  He complained of progressive shortness of breath and bilateral leg swelling.  He went to [facility] where CT chest and 2D echo reviewed moderate pericardial effusion.  He was referred to [facility] for further management.  He was admitted to the hospital for acute on chronic diastolic CHF.  He was also found to have large pericardial effusion without evidence of cardiac tamponade on 2D echo. EF was 55 to 60% but there was evidence of grade 2 diastolic dysfunction.  He was treated with IV Lasix.  Cardiologist was consulted to assist with management.  He also has kidney disease (unclear whether acute or chronic) hypoalbuminemia, iron deficiency anemia, elevated inflammatory markers (ferritin, ESR and CRP).  He underwent thoracentesis for right pleural effusion and 900 cc of fluid was removed.    Assessment/Plan:   Principal Problem:   Acute on chronic diastolic CHF (congestive heart failure) (HCC) Active Problems:   AKI (acute kidney injury) (HCC)   Pericardial effusion   Anemia   Dyspnea   Essential hypertension   Bilateral pleural effusion    Body mass index is 27.03 kg/m.   Acute on chronic diastolic CHF: IV Lasix has been held.  Monitor BMP, daily weight and urine output.  Large pericardial effusion: No evidence of cardiac tamponade.  Monitor closely.  Follow-up with cardiologist for further recommendations.  AKI, hematuria, proteinuria, hypoalbuminemia: Creatinine is improving.  Baseline creatinine unknown.  Urine protein  creatinine ratio was 1.69.  Acute hepatitis panel and HIV test were negative.  Hold losartan for now.  Consulted Dr. Posey Pronto, nephrologist.  He plans to see the patient as an outpatient.  He has ordered additional blood tests for further evaluation.  History of left lower extremity DVT: Eliquis has been switched to IV heparin in case patient requires pericardiocentesis on this admission.  Bilateral pleural effusion: S/p right-sided thoracentesis on 11/25/2020 with removal of 900 mls of amber-colored fluid.  Plan for left-sided thoracentesis tomorrow.  Hypomagnesemia: Magnesium level is 1.6.  Potassium 3.6.  Replete magnesium and potassium  Iron deficiency anemia: Serum iron 15, iron saturation 9 and ferritin 1,419.  Mildly elevated troponins: Likely from demand ischemia.  Hypertension: Hold losartan.   Diet Order             Diet Heart Room service appropriate? Yes; Fluid consistency: Thin  Diet effective now                      Consultants: Cardiologist  Procedures: Right-sided thoracentesis on 11/25/2020    Medications:    lidocaine       sodium chloride flush  3 mL Intravenous Q12H   Continuous Infusions:  sodium chloride     heparin 1,850 Units/hr (11/25/20 1231)     Anti-infectives (From admission, onward)    None              Family Communication/Anticipated D/C date and plan/Code Status   DVT prophylaxis:      Code Status: Full Code  Family Communication: His wife at the bedside  Disposition Plan:    Status is: Inpatient  Remains inpatient appropriate because:Inpatient level of care appropriate due to severity of illness  Dispo: The patient is from: Home              Anticipated d/c is to: Home              Patient currently is not medically stable to d/c.   Difficult to place patient No           Subjective:   C/o shortness of breath. Leg swelling is slightly better.  No chest pain  Objective:    Vitals:    11/25/20 0010 11/25/20 0100 11/25/20 0448 11/25/20 1003  BP: (!) 141/96  (!) 155/88 135/88  Pulse: (!) 110  (!) 101 (!) 104  Resp: 20  20   Temp: 98.5 F (36.9 C)  97.8 F (36.6 C) 97.8 F (36.6 C)  TempSrc: Oral  Oral Oral  SpO2: 95%  96% 97%  Weight:  90.4 kg    Height:       No data found.   Intake/Output Summary (Last 24 hours) at 11/25/2020 1618 Last data filed at 11/25/2020 1500 Gross per 24 hour  Intake 1101.55 ml  Output 2275 ml  Net -1173.45 ml   Filed Weights   11/24/20 0504 11/24/20 1103 11/25/20 0100  Weight: 99.8 kg 92 kg 90.4 kg    Exam:  GEN: NAD SKIN: No rash EYES: EOMI ENT: MMM CV: RRR PULM: Bibasilar rales.  No wheezing. ABD: soft, ND, NT, +BS CNS: AAO x 3, non focal EXT: B/l leg edema.  No erythema or tenderness        Data Reviewed:   I have personally reviewed following labs and imaging studies:  Labs: Labs show the following:   Basic Metabolic Panel: Recent Labs  Lab 11/23/20 1639 11/24/20 0237 11/25/20 0909  NA 133* 135 136  K 4.1 3.8 3.6  CL 103 102 104  CO2 '22 22 23  ' GLUCOSE 129* 99 108*  BUN 35* 34* 35*  CREATININE 1.90* 1.74* 1.54*  CALCIUM 7.6* 7.6* 7.6*  MG  --   --  1.6*   GFR Estimated Creatinine Clearance: 58.1 mL/min (A) (by C-G formula based on SCr of 1.54 mg/dL (H)). Liver Function Tests: Recent Labs  Lab 11/23/20 1639  AST 25  ALT 21  ALKPHOS 68  BILITOT 0.5  PROT 5.1*  ALBUMIN 1.7*   No results for input(s): LIPASE, AMYLASE in the last 168 hours. No results for input(s): AMMONIA in the last 168 hours. Coagulation profile No results for input(s): INR, PROTIME in the last 168 hours.  CBC: Recent Labs  Lab 11/23/20 1639 11/24/20 0237  WBC 12.8* 11.2*  NEUTROABS 11.0*  --   HGB 7.9* 8.0*  HCT 24.9* 24.6*  MCV 86.2 84.2  PLT 272 239   Cardiac Enzymes: No results for input(s): CKTOTAL, CKMB, CKMBINDEX, TROPONINI in the last 168 hours. BNP (last 3 results) No results for input(s): PROBNP  in the last 8760 hours. CBG: No results for input(s): GLUCAP in the last 168 hours. D-Dimer: No results for input(s): DDIMER in the last 72 hours. Hgb A1c: No results for input(s): HGBA1C in the last 72 hours. Lipid Profile: Recent Labs    11/25/20 0909  CHOL 201*  HDL 22*  LDLCALC 151*  TRIG 140  CHOLHDL 9.1   Thyroid function studies: No results for input(s): TSH, T4TOTAL, T3FREE, THYROIDAB in the last 72 hours.  Invalid input(s): FREET3  Anemia work up: Recent Labs    11/24/20 1806  FERRITIN 1,419*  TIBC 172*  IRON 15*   Sepsis Labs: Recent Labs  Lab 11/23/20 1639 11/24/20 0237  WBC 12.8* 11.2*    Microbiology Recent Results (from the past 240 hour(s))  Resp Panel by RT-PCR (Flu A&B, Covid) Nasopharyngeal Swab     Status: None   Collection Time: 11/23/20 11:01 PM   Specimen: Nasopharyngeal Swab; Nasopharyngeal(NP) swabs in vial transport medium  Result Value Ref Range Status   SARS Coronavirus 2 by RT PCR NEGATIVE NEGATIVE Final    Comment: (NOTE) SARS-CoV-2 target nucleic acids are NOT DETECTED.  The SARS-CoV-2 RNA is generally detectable in upper respiratory specimens during the acute phase of infection. The lowest concentration of SARS-CoV-2 viral copies this assay can detect is 138 copies/mL. A negative result does not preclude SARS-Cov-2 infection and should not be used as the sole basis for treatment or other patient management decisions. A negative result may occur with  improper specimen collection/handling, submission of specimen other than nasopharyngeal swab, presence of viral mutation(s) within the areas targeted by this assay, and inadequate number of viral copies(<138 copies/mL). A negative result must be combined with clinical observations, patient history, and epidemiological information. The expected result is Negative.  Fact Sheet for Patients:  EntrepreneurPulse.com.au  Fact Sheet for Healthcare Providers:   IncredibleEmployment.be  This test is no t yet approved or cleared by the [place] FDA and  has been authorized for detection and/or diagnosis of SARS-CoV-2 by FDA under an Emergency Use Authorization (EUA). This EUA will remain  in effect (meaning this test can be used) for the duration of the COVID-19 declaration under Section 564(b)(1) of the Act, 21 U.S.C.section 360bbb-3(b)(1), unless the authorization is terminated  or revoked sooner.       Influenza A by PCR NEGATIVE NEGATIVE Final   Influenza B by PCR NEGATIVE NEGATIVE Final    Comment: (NOTE) The Xpert Xpress SARS-CoV-2/FLU/RSV plus assay is intended as an aid in the diagnosis of influenza from Nasopharyngeal swab specimens and should not be used as a sole basis for treatment. Nasal washings and aspirates are unacceptable for Xpert Xpress SARS-CoV-2/FLU/RSV testing.  Fact Sheet for Patients: EntrepreneurPulse.com.au  Fact Sheet for Healthcare Providers: IncredibleEmployment.be  This test is not yet approved or cleared by the [place] FDA and has been authorized for detection and/or diagnosis of SARS-CoV-2 by FDA under an Emergency Use Authorization (EUA). This EUA will remain in effect (meaning this test can be used) for the duration of the COVID-19 declaration under Section 564(b)(1) of the Act, 21 U.S.C. section 360bbb-3(b)(1), unless the authorization is terminated or revoked.  Performed at [facility], [place] [address], [place], [place] [address]     Procedures and diagnostic studies:  DG Chest 1 View  Result Date: 11/25/2020 CLINICAL DATA:  Thoracentesis EXAM: CHEST  1 VIEW COMPARISON:  Multiple chest radiographs, most recently 11/25/2020. IR ultrasound, 11/25/2020. FINDINGS: Marked cardiomegaly. Hypoinflation. Resolution of previously-demonstrated small volume RIGHT pleural effusion. Small volume LEFT pleural effusion, now appears to be  layering. LEFT basilar opacity silhouetting the cardiac apex and LEFT hemidiaphragm no enlarging pneumothorax. No interval osseous abnormality. IMPRESSION: Resolution of previously-demonstrated small volume RIGHT pleural effusion. No pneumothorax. Residual small volume LEFT pleural effusion. Electronically Signed   By: Michaelle Birks M.D.   On: 11/25/2020 13:30   DG Chest 2 View  Result Date: 11/25/2020 CLINICAL DATA:  Cough and shortness of breath.  Pleural effusion EXAM:  CHEST - 2 VIEW COMPARISON:  11/23/2020 FINDINGS: Unchanged cardiomegaly and small pleural effusions. Mild scarring or atelectasis in the peripheral left lung. No Kerley lines or air bronchogram. No pneumothorax. IMPRESSION: Unchanged cardiomegaly and small pleural effusions. Electronically Signed   By: Monte Fantasia M.D.   On: 11/25/2020 11:57   DG Chest 2 View  Result Date: 11/23/2020 CLINICAL DATA:  Cough for 2 months.  Shortness of breath. EXAM: CHEST - 2 VIEW COMPARISON:  Chest CTA 1 week ago 11/16/2020 at [place]: Cardiomegaly is similar to prior exam. There are small bilateral pleural effusions that have improved from prior. Associated compressive atelectasis. Improved vascular congestion from prior. Subsegmental atelectasis in the left mid lung zone. No pneumothorax or confluent airspace disease. IMPRESSION: 1. Vascular congestion and pleural effusions have improved from CT 1 week ago. There are small residual pleural effusions with atelectasis. 2. Cardiomegaly is similar. 3. No acute airspace disease. Electronically Signed   By: Keith Rake M.D.   On: 11/23/2020 16:45   US RENAL  Result Date: 11/25/2020 CLINICAL DATA:  AKI EXAM: RENAL / URINARY TRACT ULTRASOUND COMPLETE COMPARISON:  None. FINDINGS: Right Kidney: Renal measurements: 13.2 x 5.2 x 5.8 cm = volume: 206 mL. Echogenicity within normal limits. No mass or hydronephrosis visualized. Left Kidney: Renal measurements: 11.8 x 6.8 x 5.5 cm = volume: 232  mL. Echogenicity within normal limits. No mass or hydronephrosis visualized. Bladder: Appears normal for degree of bladder distention. Other: None. IMPRESSION: Normal renal ultrasound. Electronically Signed   By: Merilyn Baba M.D.   On: 11/25/2020 15:02   ECHOCARDIOGRAM COMPLETE  Result Date: 11/24/2020    ECHOCARDIOGRAM REPORT   Patient Name:   Gerald Lloyd Date of Exam: 11/24/2020 Medical Rec #:  [id number]        Height:       72.0 in Accession #:    [id number]       Weight:       202.8 lb Date of Birth:  1960-11-11        BSA:          2.143 m Patient Age:    43 years         BP:           131/87 mmHg Patient Gender: M                HR:           105 bpm. Exam Location:  Inpatient Procedure: 2D Echo, Cardiac Doppler and Color Doppler Indications:    I50.33 Acute on chronic diastolic (congestive) heart failure  History:        Patient has no prior history of Echocardiogram examinations.                 Risk Factors:Hypertension.  Sonographer:    Jonelle Sidle Dance RVT Referring Phys: [id number] Eben Burow  Sonographer Comments: Reading Cardiologist notified. IMPRESSIONS  1. Left ventricular ejection fraction, by estimation, is 55 to 60%. The left ventricle has normal function. The left ventricle has no regional wall motion abnormalities. Left ventricular diastolic parameters are consistent with Grade II diastolic dysfunction (pseudonormalization). Elevated left ventricular end-diastolic pressure.  2. Right ventricular systolic function is normal. The right ventricular size is normal. There is normal pulmonary artery systolic pressure.  3. Left atrial size was severely dilated.  4. Large pericardial effusion measuring up to 2.3 cm. Right atrial collapse noted but no right ventricular collapse or significant respiratory inflow variation. RA  pressure 8 mmHg. Findings are consistent with early tamponade. Recommend serial follow up  and clinical correlation. Large pericardial effusion. The pericardial effusion  is circumferential. There is no evidence of cardiac tamponade. Moderate pleural effusion in the left lateral region.  5. The mitral valve is normal in structure. No evidence of mitral valve regurgitation. No evidence of mitral stenosis.  6. The aortic valve is tricuspid. Aortic valve regurgitation is not visualized. No aortic stenosis is present.  7. Aortic dilatation noted. There is borderline dilatation of the ascending aorta, measuring 36 mm.  8. The inferior vena cava is dilated in size with >50% respiratory variability, suggesting right atrial pressure of 8 mmHg.  9. There is a small patent foramen ovale. FINDINGS  Left Ventricle: Left ventricular ejection fraction, by estimation, is 55 to 60%. The left ventricle has normal function. The left ventricle has no regional wall motion abnormalities. The left ventricular internal cavity size was normal in size. There is  no left ventricular hypertrophy. Left ventricular diastolic parameters are consistent with Grade II diastolic dysfunction (pseudonormalization). Elevated left ventricular end-diastolic pressure. Right Ventricle: The right ventricular size is normal. No increase in right ventricular wall thickness. Right ventricular systolic function is normal. There is normal pulmonary artery systolic pressure. The tricuspid regurgitant velocity is 1.72 m/s, and  with an assumed right atrial pressure of 8 mmHg, the estimated right ventricular systolic pressure is 03.5 mmHg. Left Atrium: Left atrial size was severely dilated. Right Atrium: Right atrial size was normal in size. Pericardium: Large pericardial effusion measuring up to 2.3 cm. Right atrial collapse noted but no right ventricular collapse or significant respiratory inflow variation. RA pressure 8 mmHg. Findings are consistent with early tamponade. Recommend serial follow up and clinical correlation. A large pericardial effusion is present. The pericardial effusion is circumferential. There is diastolic  collapse of the right atrial wall. There is no evidence of cardiac tamponade. Mitral Valve: The mitral valve is normal in structure. No evidence of mitral valve regurgitation. No evidence of mitral valve stenosis. Tricuspid Valve: The tricuspid valve is normal in structure. Tricuspid valve regurgitation is trivial. No evidence of tricuspid stenosis. Aortic Valve: The aortic valve is tricuspid. Aortic valve regurgitation is not visualized. No aortic stenosis is present. Pulmonic Valve: The pulmonic valve was normal in structure. Pulmonic valve regurgitation is not visualized. No evidence of pulmonic stenosis. Aorta: Aortic dilatation noted. There is borderline dilatation of the ascending aorta, measuring 36 mm. Venous: The inferior vena cava is dilated in size with greater than 50% respiratory variability, suggesting right atrial pressure of 8 mmHg. IAS/Shunts: No atrial level shunt detected by color flow Doppler. A small patent foramen ovale is detected. Additional Comments: There is a moderate pleural effusion in the left lateral region.  LEFT VENTRICLE PLAX 2D LVIDd:         5.30 cm  Diastology LVIDs:         3.50 cm  LV e' medial:    7.07 cm/s LV PW:         1.06 cm  LV E/e' medial:  17.7 LV IVS:        1.20 cm  LV e' lateral:   6.85 cm/s LVOT diam:     2.10 cm  LV E/e' lateral: 18.2 LV SV:         101 LV SV Index:   47 LVOT Area:     3.46 cm  RIGHT VENTRICLE  IVC RV Basal diam:  3.00 cm     IVC diam: 2.00 cm RV Mid diam:    1.60 cm RV S prime:     15.70 cm/s TAPSE (M-mode): 1.6 cm LEFT ATRIUM              Index       RIGHT ATRIUM           Index LA diam:        5.20 cm  2.43 cm/m  RA Area:     16.90 cm LA Vol (A2C):   133.0 ml 62.05 ml/m RA Volume:   44.80 ml  20.90 ml/m LA Vol (A4C):   82.6 ml  38.54 ml/m LA Biplane Vol: 109.0 ml 50.86 ml/m  AORTIC VALVE LVOT Vmax:   173.00 cm/s LVOT Vmean:  121.000 cm/s LVOT VTI:    0.293 m  AORTA Ao Root diam: 3.50 cm Ao Asc diam:  3.60 cm MITRAL VALVE                 TRICUSPID VALVE MV Area (PHT): 4.26 cm     TR Peak grad:   11.9 mmHg MV Decel Time: 178 msec     TR Vmax:        172.39 cm/s MV E velocity: 125.00 cm/s MV A velocity: 90.80 cm/s   SHUNTS MV E/A ratio:  1.38         Systemic VTI:  0.29 m                             Systemic Diam: 2.10 cm Skeet Latch MD Electronically signed by Skeet Latch MD Signature Date/Time: 11/24/2020/2:42:36 PM    Final                LOS: 2 days   Bronco Mcgrory  [facility]   Pager on [email or web address]. If 7PM-7AM, please contact night-coverage at [email or web address]     11/25/2020, 4:18 PM

## 2020-11-25 NOTE — Progress Notes (Signed)
Heart Failure Navigator Progress Note  Assessed for Heart & Vascular TOC clinic readiness.  At this time, the patient does not meet criteria due to admission related to pericardial effusion. ECHO EF 55-60%.    Navigator available for reassessment of patient.   Ozella Rocks, MSN, RN Heart Failure Nurse Navigator [phone]

## 2020-11-25 NOTE — Progress Notes (Signed)
ANTICOAGULATION CONSULT NOTE - Follow Up Consult  Pharmacy Consult for Heparin Indication:  h/o recent VTE, holding Eliquis  No Known Allergies  Patient Measurements: Height: 6' (182.9 cm) Weight: 90.4 kg (199 lb 4.8 oz) IBW/kg (Calculated) : 77.6 Heparin Dosing Weight: 90.4 kg  Vital Signs: Temp: 97.8 F (36.6 C) (08/18 0448) Temp Source: Oral (08/18 0448) BP: 155/88 (08/18 0448) Pulse Rate: 101 (08/18 0448)  Labs: Recent Labs    11/23/20 1639 11/23/20 1755 11/24/20 0237 11/24/20 0403 11/25/20 0107 11/25/20 0909  HGB 7.9*  --  8.0*  --   --   --   HCT 24.9*  --  24.6*  --   --   --   PLT 272  --  239  --   --   --   APTT  --   --   --   --  39* 45*  HEPARINUNFRC  --   --   --   --  0.78*  --   CREATININE 1.90*  --  1.74*  --   --   --   TROPONINIHS 33* 36* 38* 34*  --   --     Estimated Creatinine Clearance: 51.4 mL/min (A) (by C-G formula based on SCr of 1.74 mg/dL (H)).  Assessment: Anticoag: apixaban PTA for h/o DVT. aPTT 39>45. No CBC done today. Continue patient on heparin drip in the event he progresses to tamponade and needs pleural or pericardial fluid drainage.UA with evidence of hematuria/proteinuria.  Goal of Therapy:  aPTT 66-102 seconds Monitor platelets by anticoagulation protocol: Yes   Plan:  Increase IV heparin to 1850 units/hr Recheck aptt in 6 hrs. Daily aPTT, HL, and CBC   Maecyn Panning S. Merilynn Finland, PharmD, BCPS Clinical Staff Pharmacist [email or web address]  Merilynn Finland, Leib Elahi Stillinger 11/25/2020,10:02 AM

## 2020-11-25 NOTE — Procedures (Signed)
PROCEDURE SUMMARY:  Successful US guided right thoracentesis. Yielded 900 ml of amber-colored fluid. Pt tolerated procedure well. No immediate complications.  Specimen sent for labs. CXR ordered; results pending  EBL < 2 mL  Mickie Kay, NP 11/25/2020 1:05 PM

## 2020-11-25 NOTE — Progress Notes (Addendum)
Progress Note  Patient Name: Gerald Lloyd Date of Encounter: 11/25/2020  Primary Cardiologist: None   Subjective   Gerald Lloyd is resting in bed comfortably with his wife at bedside.  Patient states he feels well, denies worsening of his shortness of breath or swelling.  Wife endorses significant improvement in his shortness of breath.  States he continues to have good urinary output.  He denies dark tarry or bright bloody stools in the past, denies ever having a colonoscopy.  Denies noticing frequent frothy or bubbly urine.  Denies changes in urine color.  Does continue to have an underlying dry cough, patient states this is been happening for years and that he is suspected that it is due to seasonal allergies.  Cough is exacerbated more during the fall and spring season  Inpatient Medications    Scheduled Meds:  losartan  50 mg Oral Daily   sodium chloride flush  3 mL Intravenous Q12H   Continuous Infusions:  sodium chloride     heparin 1,600 Units/hr (11/25/20 0245)   PRN Meds: sodium chloride, acetaminophen **OR** acetaminophen, sodium chloride flush   Vital Signs    Vitals:   11/24/20 2142 11/25/20 0010 11/25/20 0100 11/25/20 0448  BP: (!) 150/84 (!) 141/96  (!) 155/88  Pulse:  (!) 110  (!) 101  Resp:  20  20  Temp:  98.5 F (36.9 C)  97.8 F (36.6 C)  TempSrc:  Oral  Oral  SpO2:  95%  96%  Weight:   90.4 kg   Height:        Intake/Output Summary (Last 24 hours) at 11/25/2020 0727 Last data filed at 11/25/2020 8127 Gross per 24 hour  Intake 921.18 ml  Output 1825 ml  Net -903.82 ml   Filed Weights   11/24/20 0504 11/24/20 1103 11/25/20 0100  Weight: 99.8 kg 92 kg 90.4 kg    Telemetry    Sinus tach- Personally Reviewed  ECG    The EKG was personally reviewed and demonstrates:  sinus tachycardia, 105 BPM. Normal PR and QT intervals. Normal axis. No st wave changes.   Physical Exam   GEN: No acute distress.   Neck: No JVD Cardiac: RRR, no  murmurs, rubs, or gallops.  Respiratory: Breathing comfortably on room air.  Crackles in lower lung fields GI: Soft, nontender, non-distended  MS: 2+ lower extremity edema to his knees bilaterally Neuro:  Nonfocal  Psych: Normal affect   Labs    Chemistry Recent Labs  Lab 11/23/20 1639 11/24/20 0237  NA 133* 135  K 4.1 3.8  CL 103 102  CO2 22 22  GLUCOSE 129* 99  BUN 35* 34*  CREATININE 1.90* 1.74*  CALCIUM 7.6* 7.6*  PROT 5.1*  --   ALBUMIN 1.7*  --   AST 25  --   ALT 21  --   ALKPHOS 68  --   BILITOT 0.5  --   GFRNONAA 41* 45*  ANIONGAP 8 11     Hematology Recent Labs  Lab 11/23/20 1639 11/24/20 0237  WBC 12.8* 11.2*  RBC 2.89* 2.92*  HGB 7.9* 8.0*  HCT 24.9* 24.6*  MCV 86.2 84.2  MCH 27.3 27.4  MCHC 31.7 32.5  RDW 14.5 14.5  PLT 272 239   Cardiac EnzymesNo results for input(s): TROPONINI in the last 168 hours. No results for input(s): TROPIPOC in the last 168 hours.   BNP Recent Labs  Lab 11/23/20 1639  BNP 490.3*     DDimer No results  for input(s): DDIMER in the last 168 hours.   Radiology    DG Chest 2 View  Result Date: 11/23/2020 CLINICAL DATA:  Cough for 2 months.  Shortness of breath. EXAM: CHEST - 2 VIEW COMPARISON:  Chest CTA 1 week ago 11/16/2020 at [place]: Cardiomegaly is similar to prior exam. There are small bilateral pleural effusions that have improved from prior. Associated compressive atelectasis. Improved vascular congestion from prior. Subsegmental atelectasis in the left mid lung zone. No pneumothorax or confluent airspace disease. IMPRESSION: 1. Vascular congestion and pleural effusions have improved from CT 1 week ago. There are small residual pleural effusions with atelectasis. 2. Cardiomegaly is similar. 3. No acute airspace disease. Electronically Signed   By: Keith Rake M.D.   On: 11/23/2020 16:45   ECHOCARDIOGRAM COMPLETE  Result Date: 11/24/2020    ECHOCARDIOGRAM REPORT   Patient Name:   Gerald Lloyd Date of Exam: 11/24/2020 Medical Rec #:  [id number]        Height:       72.0 in Accession #:    [id number]       Weight:       202.8 lb Date of Birth:  [birth year]        BSA:          2.143 m Patient Age:    60 years         BP:           131/87 mmHg Patient Gender: M                HR:           105 bpm. Exam Location:  Inpatient Procedure: 2D Echo, Cardiac Doppler and Color Doppler Indications:    I50.33 Acute on chronic diastolic (congestive) heart failure  History:        Patient has no prior history of Echocardiogram examinations.                 Risk Factors:Hypertension.  Sonographer:    Jonelle Sidle Dance RVT Referring Phys: [id number] Eben Burow  Sonographer Comments: Reading Cardiologist notified. IMPRESSIONS  1. Left ventricular ejection fraction, by estimation, is 55 to 60%. The left ventricle has normal function. The left ventricle has no regional wall motion abnormalities. Left ventricular diastolic parameters are consistent with Grade II diastolic dysfunction (pseudonormalization). Elevated left ventricular end-diastolic pressure.  2. Right ventricular systolic function is normal. The right ventricular size is normal. There is normal pulmonary artery systolic pressure.  3. Left atrial size was severely dilated.  4. Large pericardial effusion measuring up to 2.3 cm. Right atrial collapse noted but no right ventricular collapse or significant respiratory inflow variation. RA pressure 8 mmHg. Findings are consistent with early tamponade. Recommend serial follow up  and clinical correlation. Large pericardial effusion. The pericardial effusion is circumferential. There is no evidence of cardiac tamponade. Moderate pleural effusion in the left lateral region.  5. The mitral valve is normal in structure. No evidence of mitral valve regurgitation. No evidence of mitral stenosis.  6. The aortic valve is tricuspid. Aortic valve regurgitation is not visualized. No aortic stenosis is present.  7. Aortic  dilatation noted. There is borderline dilatation of the ascending aorta, measuring 36 mm.  8. The inferior vena cava is dilated in size with >50% respiratory variability, suggesting right atrial pressure of 8 mmHg.  9. There is a small patent foramen ovale. FINDINGS  Left Ventricle: Left ventricular ejection fraction, by estimation, is 55  to 60%. The left ventricle has normal function. The left ventricle has no regional wall motion abnormalities. The left ventricular internal cavity size was normal in size. There is  no left ventricular hypertrophy. Left ventricular diastolic parameters are consistent with Grade II diastolic dysfunction (pseudonormalization). Elevated left ventricular end-diastolic pressure. Right Ventricle: The right ventricular size is normal. No increase in right ventricular wall thickness. Right ventricular systolic function is normal. There is normal pulmonary artery systolic pressure. The tricuspid regurgitant velocity is 1.72 m/s, and  with an assumed right atrial pressure of 8 mmHg, the estimated right ventricular systolic pressure is 12.4 mmHg. Left Atrium: Left atrial size was severely dilated. Right Atrium: Right atrial size was normal in size. Pericardium: Large pericardial effusion measuring up to 2.3 cm. Right atrial collapse noted but no right ventricular collapse or significant respiratory inflow variation. RA pressure 8 mmHg. Findings are consistent with early tamponade. Recommend serial follow up and clinical correlation. A large pericardial effusion is present. The pericardial effusion is circumferential. There is diastolic collapse of the right atrial wall. There is no evidence of cardiac tamponade. Mitral Valve: The mitral valve is normal in structure. No evidence of mitral valve regurgitation. No evidence of mitral valve stenosis. Tricuspid Valve: The tricuspid valve is normal in structure. Tricuspid valve regurgitation is trivial. No evidence of tricuspid stenosis. Aortic  Valve: The aortic valve is tricuspid. Aortic valve regurgitation is not visualized. No aortic stenosis is present. Pulmonic Valve: The pulmonic valve was normal in structure. Pulmonic valve regurgitation is not visualized. No evidence of pulmonic stenosis. Aorta: Aortic dilatation noted. There is borderline dilatation of the ascending aorta, measuring 36 mm. Venous: The inferior vena cava is dilated in size with greater than 50% respiratory variability, suggesting right atrial pressure of 8 mmHg. IAS/Shunts: No atrial level shunt detected by color flow Doppler. A small patent foramen ovale is detected. Additional Comments: There is a moderate pleural effusion in the left lateral region.  LEFT VENTRICLE PLAX 2D LVIDd:         5.30 cm  Diastology LVIDs:         3.50 cm  LV e' medial:    7.07 cm/s LV PW:         1.06 cm  LV E/e' medial:  17.7 LV IVS:        1.20 cm  LV e' lateral:   6.85 cm/s LVOT diam:     2.10 cm  LV E/e' lateral: 18.2 LV SV:         101 LV SV Index:   47 LVOT Area:     3.46 cm  RIGHT VENTRICLE             IVC RV Basal diam:  3.00 cm     IVC diam: 2.00 cm RV Mid diam:    1.60 cm RV S prime:     15.70 cm/s TAPSE (M-mode): 1.6 cm LEFT ATRIUM              Index       RIGHT ATRIUM           Index LA diam:        5.20 cm  2.43 cm/m  RA Area:     16.90 cm LA Vol (A2C):   133.0 ml 62.05 ml/m RA Volume:   44.80 ml  20.90 ml/m LA Vol (A4C):   82.6 ml  38.54 ml/m LA Biplane Vol: 109.0 ml 50.86 ml/m  AORTIC VALVE LVOT Vmax:  173.00 cm/s LVOT Vmean:  121.000 cm/s LVOT VTI:    0.293 m  AORTA Ao Root diam: 3.50 cm Ao Asc diam:  3.60 cm MITRAL VALVE                TRICUSPID VALVE MV Area (PHT): 4.26 cm     TR Peak grad:   11.9 mmHg MV Decel Time: 178 msec     TR Vmax:        172.39 cm/s MV E velocity: 125.00 cm/s MV A velocity: 90.80 cm/s   SHUNTS MV E/A ratio:  1.38         Systemic VTI:  0.29 m                             Systemic Diam: 2.10 cm Skeet Latch MD Electronically signed by Skeet Latch MD Signature Date/Time: 11/24/2020/2:42:36 PM    Final     Cardiac Studies   TTE 11/24/20  1. Left ventricular ejection fraction, by estimation, is 55 to 60%. The  left ventricle has normal function. The left ventricle has no regional  wall motion abnormalities. Left ventricular diastolic parameters are  consistent with Grade II diastolic  dysfunction (pseudonormalization). Elevated left ventricular end-diastolic  pressure.   2. Right ventricular systolic function is normal. The right ventricular  size is normal. There is normal pulmonary artery systolic pressure.   3. Left atrial size was severely dilated.   4. Large pericardial effusion measuring up to 2.3 cm. Right atrial  collapse noted but no right ventricular collapse or significant  respiratory inflow variation. RA pressure 8 mmHg. Findings are consistent  with early tamponade. Recommend serial follow up   and clinical correlation. Large pericardial effusion. The pericardial  effusion is circumferential. There is no evidence of cardiac tamponade.  Moderate pleural effusion in the left lateral region.   5. The mitral valve is normal in structure. No evidence of mitral valve  regurgitation. No evidence of mitral stenosis.   6. The aortic valve is tricuspid. Aortic valve regurgitation is not  visualized. No aortic stenosis is present.   7. Aortic dilatation noted. There is borderline dilatation of the  ascending aorta, measuring 36 mm.   8. The inferior vena cava is dilated in size with >50% respiratory  variability, suggesting right atrial pressure of 8 mmHg.   9. There is a small patent foramen ovale.   Patient Profile     60 y.o. male with a PMHx of DVT on anticoagulation, HTN presented to the ED with fatigue and shortness of breath found to have a moderate pericardial effusion.  Assessment & Plan   Pericardial Effusion Remains hemodynamically stable and with improvement in his shortness of breath.  No clinical  signs of tamponade.  Continue patient on heparin drip in the event he progresses to tamponade and needs pleural or pericardial fluid drainage.  ESR, CRP, ferritin significantly elevated, which is suspicious for an inflammatory state being the underlying etiology of his symptoms.   -Continue to monitor closely for signs of progression to tamponade -Continue IV heparin -Continue inflammatory work up per primary team  AKI Hematuria Proteinuria  Hypoalbuminemia Limited data for baseline Cr, recent studies Cr ranged from 1.4-1.9. UA with evidence of hematuria/proteinuria.  Continued work-up of his AKI by primary team.  Given his overall picture and significantly elevated inflammatory markers there is some concern for nephrotic syndrome in setting of hypercoagulable state and hypoalbuminemia. Can also  consider nephritic workup with his HTN, AKI, hematuria, and elevated inflammatory markers. -Prot/Cr ratio pending -Lipid panel pending -Continue gentle diuresis with Lasix  LLE DVT Transitioned to heparin, in the event a thoracentesis/pericardiocentesis is needed. Appreciate pharmacy's assistance.   Normocytic Anemia ESR and CRP elevated, iron panel and ferritin consistent with anemia of chronic disease.  -continue workup above for inflammatory states.   Chronic Dry Cough Suspect recent exacerbation is secondary to his pleural effusions.  We will continue to monitor and will need close follow-up on outpatient basis.  Please await attending attestation for final recommendations  For questions or updates, please contact [place] Please consult [email or web address] for contact info under Cardiology/STEMI.    Signed, Sanjuana Letters, MD  Internal Medicine Resident PGY-2 11/25/2020, 7:27 AM

## 2020-11-25 NOTE — Progress Notes (Addendum)
ANTICOAGULATION CONSULT NOTE - Initial Consult  Pharmacy Consult for heparin Indication: LLE popliteal DVT 05/26/20  No Known Allergies  Patient Measurements: Height: 6' (182.9 cm) Weight: 90.4 kg (199 lb 4.8 oz) IBW/kg (Calculated) : 77.6 Heparin Dosing Weight: 92kg  Vital Signs: Temp: 97.8 F (36.6 C) (08/18 1003) Temp Source: Oral (08/18 1003) BP: 135/88 (08/18 1003) Pulse Rate: 104 (08/18 1003)  Labs: Recent Labs    11/23/20 1639 11/23/20 1755 11/24/20 0237 11/24/20 0403 11/25/20 0107 11/25/20 0909 11/25/20 1628  HGB 7.9*  --  8.0*  --   --   --   --   HCT 24.9*  --  24.6*  --   --   --   --   PLT 272  --  239  --   --   --   --   APTT  --   --   --   --  39* 45* 51*  HEPARINUNFRC  --   --   --   --  0.78*  --   --   CREATININE 1.90*  --  1.74*  --   --  1.54*  --   TROPONINIHS 33* 36* 38* 34*  --   --   --      Estimated Creatinine Clearance: 58.1 mL/min (A) (by C-G formula based on SCr of 1.54 mg/dL (H)).   Medical History: Past Medical History:  Diagnosis Date   Anemia    Hypertension       Assessment: 60 yo M with hx LLE popliteal DVT 05/26/20 on apixaban PTA. Apixaban held for procedure, last dose 8/17 @ 3:30am.  Pharmacy consulted for heparin.    APTT 51 is subtherapeutic and up trending slowly. No CBC 8/18. S/p thoracentesis 8/18. No reported bleeding.   Goal of Therapy:  Heparin level 0.3-0.7 units/ml Monitor platelets by anticoagulation protocol: Yes   Plan:  Increase Heparin to 2100 units/hr F/u 8hr APTT/HL F/u aPTT until correlates with heparin level  Monitor daily aPTT, HL, CBC/plt Monitor for signs/symptoms of bleeding  F/u restart apixaban   Alphia Moh, PharmD, BCPS, [facility] Clinical Pharmacist  Please check AMION for all [facility] Pharmacy phone numbers After 10:00 PM, call Main Pharmacy [phone]

## 2020-11-25 NOTE — Progress Notes (Signed)
ANTICOAGULATION CONSULT NOTE  Pharmacy Consult for heparin Indication: LLE popliteal DVT 05/26/20  No Known Allergies  Patient Measurements: Height: 6' (182.9 cm) Weight: 92 kg (202 lb 13.2 oz) IBW/kg (Calculated) : 77.6 Heparin Dosing Weight: 92kg  Vital Signs: Temp: 98.5 F (36.9 C) (08/18 0010) Temp Source: Oral (08/18 0010) BP: 141/96 (08/18 0010) Pulse Rate: 110 (08/18 0010)  Labs: Recent Labs    11/23/20 1639 11/23/20 1755 11/24/20 0237 11/24/20 0403 11/25/20 0107  HGB 7.9*  --  8.0*  --   --   HCT 24.9*  --  24.6*  --   --   PLT 272  --  239  --   --   APTT  --   --   --   --  39*  HEPARINUNFRC  --   --   --   --  0.78*  CREATININE 1.90*  --  1.74*  --   --   TROPONINIHS 33* 36* 38* 34*  --      Estimated Creatinine Clearance: 51.4 mL/min (A) (by C-G formula based on SCr of 1.74 mg/dL (H)).   Medical History: Past Medical History:  Diagnosis Date   Anemia    Hypertension      Assessment: 60 yo M with hx LLE popliteal DVT 05/26/20 on apixaban PTA. Apixaban held for possible procedure, last dose 8/17 @ 3:30am.  Pharmacy consulted for heparin.    Heparin level 0.78 (affected by apixaban), aPTT 39 sec (subtherapeutic) on gtt at 1400 units/hr. No issues with line or bleeding reported per RN.  Goal of Therapy:  Heparin level 0.3-0.7 units/ml, aPTT 66-102 sec Monitor platelets by anticoagulation protocol: Yes   Plan:  Increase heparin to 1600 units/hr F/u 6hr aPTT  Christoper Fabian, PharmD, BCPS Please see amion for complete clinical pharmacist phone list 11/25/2020 2:42 AM

## 2020-11-26 ENCOUNTER — Inpatient Hospital Stay (HOSPITAL_COMMUNITY): Payer: Commercial Managed Care - PPO

## 2020-11-26 ENCOUNTER — Encounter: Payer: Self-pay | Admitting: Radiology

## 2020-11-26 HISTORY — PX: IR THORACENTESIS ASP PLEURAL SPACE W/IMG GUIDE: IMG5380

## 2020-11-26 LAB — COMPREHENSIVE METABOLIC PANEL
ALT: 19 U/L (ref 0–44)
AST: 24 U/L (ref 15–41)
Albumin: 1.5 g/dL — ABNORMAL LOW (ref 3.5–5.0)
Alkaline Phosphatase: 61 U/L (ref 38–126)
Anion gap: 8 (ref 5–15)
BUN: 33 mg/dL — ABNORMAL HIGH (ref 6–20)
CO2: 24 mmol/L (ref 22–32)
Calcium: 7.5 mg/dL — ABNORMAL LOW (ref 8.9–10.3)
Chloride: 103 mmol/L (ref 98–111)
Creatinine, Ser: 1.33 mg/dL — ABNORMAL HIGH (ref 0.61–1.24)
GFR, Estimated: >60 mL/min (ref >60)
Glucose, Bld: 93 mg/dL (ref 70–99)
Potassium: 3.7 mmol/L (ref 3.5–5.1)
Sodium: 135 mmol/L (ref 135–145)
Total Bilirubin: 0.4 mg/dL (ref 0.3–1.2)
Total Protein: 4.6 g/dL — ABNORMAL LOW (ref 6.5–8.1)

## 2020-11-26 LAB — ENA+DNA/DS+ANTICH+CENTRO+JO...
Anti JO-1: <0.2 AI (ref 0.0–0.9)
Centromere Ab Screen: <0.2 AI (ref 0.0–0.9)
Chromatin Ab SerPl-aCnc: >8 AI — ABNORMAL HIGH (ref 0.0–0.9)
ENA SM Ab Ser-aCnc: 1.7 AI — ABNORMAL HIGH (ref 0.0–0.9)
Ribonucleic Protein: 0.3 AI (ref 0.0–0.9)
SSA (Ro) (ENA) Antibody, IgG: <0.2 AI (ref 0.0–0.9)
SSB (La) (ENA) Antibody, IgG: <0.2 AI (ref 0.0–0.9)
Scleroderma (Scl-70) (ENA) Antibody, IgG: <0.2 AI (ref 0.0–0.9)
ds DNA Ab: >300 IU/mL — ABNORMAL HIGH (ref 0–9)

## 2020-11-26 LAB — BASIC METABOLIC PANEL
Anion gap: 8 (ref 5–15)
BUN: 36 mg/dL — ABNORMAL HIGH (ref 6–20)
CO2: 23 mmol/L (ref 22–32)
Calcium: 7.4 mg/dL — ABNORMAL LOW (ref 8.9–10.3)
Chloride: 102 mmol/L (ref 98–111)
Creatinine, Ser: 1.49 mg/dL — ABNORMAL HIGH (ref 0.61–1.24)
GFR, Estimated: 54 mL/min — ABNORMAL LOW (ref >60)
Glucose, Bld: 127 mg/dL — ABNORMAL HIGH (ref 70–99)
Potassium: 3.6 mmol/L (ref 3.5–5.1)
Sodium: 133 mmol/L — ABNORMAL LOW (ref 135–145)

## 2020-11-26 LAB — KAPPA/LAMBDA LIGHT CHAINS
Kappa free light chain: 94.5 mg/L — ABNORMAL HIGH (ref 3.3–19.4)
Kappa, lambda light chain ratio: 0.93 (ref 0.26–1.65)
Lambda free light chains: 102.1 mg/L — ABNORMAL HIGH (ref 5.7–26.3)

## 2020-11-26 LAB — CBC
HCT: 22.8 % — ABNORMAL LOW (ref 39.0–52.0)
Hemoglobin: 7.4 g/dL — ABNORMAL LOW (ref 13.0–17.0)
MCH: 27.2 pg (ref 26.0–34.0)
MCHC: 32.5 g/dL (ref 30.0–36.0)
MCV: 83.8 fL (ref 80.0–100.0)
Platelets: 301 10*3/uL (ref 150–400)
RBC: 2.72 MIL/uL — ABNORMAL LOW (ref 4.22–5.81)
RDW: 14.4 % (ref 11.5–15.5)
WBC: 7.4 10*3/uL (ref 4.0–10.5)
nRBC: 0 % (ref 0.0–0.2)

## 2020-11-26 LAB — ANA W/REFLEX IF POSITIVE: Anti Nuclear Antibody (ANA): POSITIVE — AB

## 2020-11-26 LAB — HEMOGLOBIN AND HEMATOCRIT, BLOOD
HCT: 31.9 % — ABNORMAL LOW (ref 39.0–52.0)
Hemoglobin: 10.7 g/dL — ABNORMAL LOW (ref 13.0–17.0)

## 2020-11-26 LAB — C4 COMPLEMENT: Complement C4, Body Fluid: 7 mg/dL — ABNORMAL LOW (ref 12–38)

## 2020-11-26 LAB — PHOSPHORUS: Phosphorus: 3.1 mg/dL (ref 2.5–4.6)

## 2020-11-26 LAB — HEPARIN LEVEL (UNFRACTIONATED)
Heparin Unfractionated: 0.37 IU/mL (ref 0.30–0.70)
Heparin Unfractionated: 0.21 IU/mL — ABNORMAL LOW (ref 0.30–0.70)
Heparin Unfractionated: 0.29 IU/mL — ABNORMAL LOW (ref 0.30–0.70)

## 2020-11-26 LAB — GRAM STAIN

## 2020-11-26 LAB — MAGNESIUM: Magnesium: 1.8 mg/dL (ref 1.7–2.4)

## 2020-11-26 LAB — CYTOLOGY - NON PAP

## 2020-11-26 LAB — APTT
aPTT: 49 seconds — ABNORMAL HIGH (ref 24–36)
aPTT: 52 seconds — ABNORMAL HIGH (ref 24–36)

## 2020-11-26 LAB — ABO/RH: ABO/RH(D): A POS

## 2020-11-26 LAB — C3 COMPLEMENT: C3 Complement: 61 mg/dL — ABNORMAL LOW (ref 82–167)

## 2020-11-26 LAB — PREPARE RBC (CROSSMATCH)

## 2020-11-26 LAB — ANTI-DNA ANTIBODY, DOUBLE-STRANDED: ds DNA Ab: >300 IU/mL — ABNORMAL HIGH (ref 0–9)

## 2020-11-26 MED ORDER — LIDOCAINE HCL 1 % IJ SOLN
INTRAMUSCULAR | Status: AC
  Filled 2020-11-26: qty 20

## 2020-11-26 MED ORDER — SODIUM CHLORIDE 0.9% IV SOLUTION: Freq: Once | INTRAVENOUS | Status: AC

## 2020-11-26 MED ORDER — FUROSEMIDE 40 MG PO TABS
40.0000 mg | ORAL_TABLET | Freq: Every day | ORAL | Status: DC
  Administered 2020-11-26 – 2020-11-30 (×5): 40 mg via ORAL
  Filled 2020-11-26 (×5): qty 1

## 2020-11-26 MED ORDER — AMLODIPINE BESYLATE 5 MG PO TABS
5.0000 mg | ORAL_TABLET | Freq: Every day | ORAL | Status: DC
  Administered 2020-11-26 – 2020-11-29 (×4): 5 mg via ORAL
  Filled 2020-11-26 (×4): qty 1

## 2020-11-26 NOTE — Progress Notes (Addendum)
Progress Note  Patient Name: Gerald Lloyd Date of Encounter: 11/26/2020  Primary Cardiologist: Buford Dresser, MD   Subjective   Gerald Lloyd states he feels well this morning. Does endorse increased cough since having thoracentesis done yesterday. Denies shortness of breath or chest pain, feels as though his breathing continues to improve. Has been ambulating around his room without difficulty. Denies difficulty urinating, had a BM yesterday. Planning for left sided thoracentesis today.   Inpatient Medications    Scheduled Meds:  sodium chloride flush  3 mL Intravenous Q12H   Continuous Infusions:  sodium chloride     heparin 2,100 Units/hr (11/26/20 0113)   PRN Meds: sodium chloride, acetaminophen **OR** acetaminophen, lidocaine (PF), sodium chloride flush   Vital Signs    Vitals:   11/25/20 1003 11/25/20 1935 11/26/20 0551 11/26/20 0912  BP: 135/88 (!) 133/92 131/83 (!) 150/86  Pulse: (!) 104 91 (!) 101   Resp:  '20 18 16  ' Temp: 97.8 F (36.6 C) 98.6 F (37 C) 98.4 F (36.9 C) 98.2 F (36.8 C)  TempSrc: Oral Oral Oral Oral  SpO2: 97% 100% 97% 97%  Weight:   89.9 kg   Height:        Intake/Output Summary (Last 24 hours) at 11/26/2020 0939 Last data filed at 11/26/2020 0926 Gross per 24 hour  Intake 601.49 ml  Output 1550 ml  Net -948.51 ml   Filed Weights   11/24/20 1103 11/25/20 0100 11/26/20 0551  Weight: 92 kg 90.4 kg 89.9 kg    Telemetry    Sinus tach- Personally Reviewed  ECG    The EKG was personally reviewed and demonstrates:  sinus tachycardia, 105 BPM. Normal PR and QT intervals. Normal axis. No st wave changes.   Physical Exam   GEN: No acute distress.   Neck: No JVD Cardiac: RRR, no murmurs, rubs, or gallops.  Respiratory: Breathing comfortably on room air. Decreased breath sounds left lower lobe.  GI: Soft, nontender, non-distended  MS: 1-2+ lower extremity edema to his knees bilaterally Neuro:  Nonfocal  Psych: Normal  affect   Labs    Chemistry Recent Labs  Lab 11/23/20 1639 11/24/20 0237 11/25/20 0909 11/26/20 0252  NA 133* 135 136 133*  K 4.1 3.8 3.6 3.6  CL 103 102 104 102  CO2 '22 22 23 23  ' GLUCOSE 129* 99 108* 127*  BUN 35* 34* 35* 36*  CREATININE 1.90* 1.74* 1.54* 1.49*  CALCIUM 7.6* 7.6* 7.6* 7.4*  PROT 5.1*  --   --   --   ALBUMIN 1.7*  --   --   --   AST 25  --   --   --   ALT 21  --   --   --   ALKPHOS 68  --   --   --   BILITOT 0.5  --   --   --   GFRNONAA 41* 45* 52* 54*  ANIONGAP '8 11 9 8     ' Hematology Recent Labs  Lab 11/23/20 1639 11/24/20 0237 11/26/20 0252  WBC 12.8* 11.2* 7.4  RBC 2.89* 2.92* 2.72*  HGB 7.9* 8.0* 7.4*  HCT 24.9* 24.6* 22.8*  MCV 86.2 84.2 83.8  MCH 27.3 27.4 27.2  MCHC 31.7 32.5 32.5  RDW 14.5 14.5 14.4  PLT 272 239 301   Cardiac EnzymesNo results for input(s): TROPONINI in the last 168 hours. No results for input(s): TROPIPOC in the last 168 hours.   BNP Recent Labs  Lab 11/23/20 1639  BNP 490.3*     DDimer No results for input(s): DDIMER in the last 168 hours.   Radiology    DG Chest 1 View  Result Date: 11/25/2020 CLINICAL DATA:  Thoracentesis EXAM: CHEST  1 VIEW COMPARISON:  Multiple chest radiographs, most recently 11/25/2020. IR ultrasound, 11/25/2020. FINDINGS: Marked cardiomegaly. Hypoinflation. Resolution of previously-demonstrated small volume RIGHT pleural effusion. Small volume LEFT pleural effusion, now appears to be layering. LEFT basilar opacity silhouetting the cardiac apex and LEFT hemidiaphragm no enlarging pneumothorax. No interval osseous abnormality. IMPRESSION: Resolution of previously-demonstrated small volume RIGHT pleural effusion. No pneumothorax. Residual small volume LEFT pleural effusion. Electronically Signed   By: Michaelle Birks M.D.   On: 11/25/2020 13:30   DG Chest 2 View  Result Date: 11/25/2020 CLINICAL DATA:  Cough and shortness of breath.  Pleural effusion EXAM: CHEST - 2 VIEW COMPARISON:   11/23/2020 FINDINGS: Unchanged cardiomegaly and small pleural effusions. Mild scarring or atelectasis in the peripheral left lung. No Kerley lines or air bronchogram. No pneumothorax. IMPRESSION: Unchanged cardiomegaly and small pleural effusions. Electronically Signed   By: Monte Fantasia M.D.   On: 11/25/2020 11:57   US RENAL  Result Date: 11/25/2020 CLINICAL DATA:  AKI EXAM: RENAL / URINARY TRACT ULTRASOUND COMPLETE COMPARISON:  None. FINDINGS: Right Kidney: Renal measurements: 13.2 x 5.2 x 5.8 cm = volume: 206 mL. Echogenicity within normal limits. No mass or hydronephrosis visualized. Left Kidney: Renal measurements: 11.8 x 6.8 x 5.5 cm = volume: 232 mL. Echogenicity within normal limits. No mass or hydronephrosis visualized. Bladder: Appears normal for degree of bladder distention. Other: None. IMPRESSION: Normal renal ultrasound. Electronically Signed   By: Merilyn Baba M.D.   On: 11/25/2020 15:02   ECHOCARDIOGRAM COMPLETE  Result Date: 11/24/2020    ECHOCARDIOGRAM REPORT   Patient Name:   Gerald Lloyd Date of Exam: 11/24/2020 Medical Rec #:  [id number]        Height:       72.0 in Accession #:    [id number]       Weight:       202.8 lb Date of Birth:  1960-12-05        BSA:          2.143 m Patient Age:    20 years         BP:           131/87 mmHg Patient Gender: M                HR:           105 bpm. Exam Location:  Inpatient Procedure: 2D Echo, Cardiac Doppler and Color Doppler Indications:    I50.33 Acute on chronic diastolic (congestive) heart failure  History:        Patient has no prior history of Echocardiogram examinations.                 Risk Factors:Hypertension.  Sonographer:    Jonelle Sidle Dance RVT Referring Phys: [id number] Eben Burow  Sonographer Comments: Reading Cardiologist notified. IMPRESSIONS  1. Left ventricular ejection fraction, by estimation, is 55 to 60%. The left ventricle has normal function. The left ventricle has no regional wall motion abnormalities. Left  ventricular diastolic parameters are consistent with Grade II diastolic dysfunction (pseudonormalization). Elevated left ventricular end-diastolic pressure.  2. Right ventricular systolic function is normal. The right ventricular size is normal. There is normal pulmonary artery systolic pressure.  3. Left atrial size was severely dilated.  4. Large  pericardial effusion measuring up to 2.3 cm. Right atrial collapse noted but no right ventricular collapse or significant respiratory inflow variation. RA pressure 8 mmHg. Findings are consistent with early tamponade. Recommend serial follow up  and clinical correlation. Large pericardial effusion. The pericardial effusion is circumferential. There is no evidence of cardiac tamponade. Moderate pleural effusion in the left lateral region.  5. The mitral valve is normal in structure. No evidence of mitral valve regurgitation. No evidence of mitral stenosis.  6. The aortic valve is tricuspid. Aortic valve regurgitation is not visualized. No aortic stenosis is present.  7. Aortic dilatation noted. There is borderline dilatation of the ascending aorta, measuring 36 mm.  8. The inferior vena cava is dilated in size with >50% respiratory variability, suggesting right atrial pressure of 8 mmHg.  9. There is a small patent foramen ovale. FINDINGS  Left Ventricle: Left ventricular ejection fraction, by estimation, is 55 to 60%. The left ventricle has normal function. The left ventricle has no regional wall motion abnormalities. The left ventricular internal cavity size was normal in size. There is  no left ventricular hypertrophy. Left ventricular diastolic parameters are consistent with Grade II diastolic dysfunction (pseudonormalization). Elevated left ventricular end-diastolic pressure. Right Ventricle: The right ventricular size is normal. No increase in right ventricular wall thickness. Right ventricular systolic function is normal. There is normal pulmonary artery systolic  pressure. The tricuspid regurgitant velocity is 1.72 m/s, and  with an assumed right atrial pressure of 8 mmHg, the estimated right ventricular systolic pressure is 18.8 mmHg. Left Atrium: Left atrial size was severely dilated. Right Atrium: Right atrial size was normal in size. Pericardium: Large pericardial effusion measuring up to 2.3 cm. Right atrial collapse noted but no right ventricular collapse or significant respiratory inflow variation. RA pressure 8 mmHg. Findings are consistent with early tamponade. Recommend serial follow up and clinical correlation. A large pericardial effusion is present. The pericardial effusion is circumferential. There is diastolic collapse of the right atrial wall. There is no evidence of cardiac tamponade. Mitral Valve: The mitral valve is normal in structure. No evidence of mitral valve regurgitation. No evidence of mitral valve stenosis. Tricuspid Valve: The tricuspid valve is normal in structure. Tricuspid valve regurgitation is trivial. No evidence of tricuspid stenosis. Aortic Valve: The aortic valve is tricuspid. Aortic valve regurgitation is not visualized. No aortic stenosis is present. Pulmonic Valve: The pulmonic valve was normal in structure. Pulmonic valve regurgitation is not visualized. No evidence of pulmonic stenosis. Aorta: Aortic dilatation noted. There is borderline dilatation of the ascending aorta, measuring 36 mm. Venous: The inferior vena cava is dilated in size with greater than 50% respiratory variability, suggesting right atrial pressure of 8 mmHg. IAS/Shunts: No atrial level shunt detected by color flow Doppler. A small patent foramen ovale is detected. Additional Comments: There is a moderate pleural effusion in the left lateral region.  LEFT VENTRICLE PLAX 2D LVIDd:         5.30 cm  Diastology LVIDs:         3.50 cm  LV e' medial:    7.07 cm/s LV PW:         1.06 cm  LV E/e' medial:  17.7 LV IVS:        1.20 cm  LV e' lateral:   6.85 cm/s LVOT diam:      2.10 cm  LV E/e' lateral: 18.2 LV SV:         101 LV SV Index:   47  LVOT Area:     3.46 cm  RIGHT VENTRICLE             IVC RV Basal diam:  3.00 cm     IVC diam: 2.00 cm RV Mid diam:    1.60 cm RV S prime:     15.70 cm/s TAPSE (M-mode): 1.6 cm LEFT ATRIUM              Index       RIGHT ATRIUM           Index LA diam:        5.20 cm  2.43 cm/m  RA Area:     16.90 cm LA Vol (A2C):   133.0 ml 62.05 ml/m RA Volume:   44.80 ml  20.90 ml/m LA Vol (A4C):   82.6 ml  38.54 ml/m LA Biplane Vol: 109.0 ml 50.86 ml/m  AORTIC VALVE LVOT Vmax:   173.00 cm/s LVOT Vmean:  121.000 cm/s LVOT VTI:    0.293 m  AORTA Ao Root diam: 3.50 cm Ao Asc diam:  3.60 cm MITRAL VALVE                TRICUSPID VALVE MV Area (PHT): 4.26 cm     TR Peak grad:   11.9 mmHg MV Decel Time: 178 msec     TR Vmax:        172.39 cm/s MV E velocity: 125.00 cm/s MV A velocity: 90.80 cm/s   SHUNTS MV E/A ratio:  1.38         Systemic VTI:  0.29 m                             Systemic Diam: 2.10 cm Skeet Latch MD Electronically signed by Skeet Latch MD Signature Date/Time: 11/24/2020/2:42:36 PM    Final    IR THORACENTESIS ASP PLEURAL SPACE W/IMG GUIDE  Result Date: 11/25/2020 INDICATION: Patient recently admitted with a pericardial effusion and found to have bilateral pleural effusions. Interventional radiology asked to perform a therapeutic and diagnostic thoracentesis. EXAM: ULTRASOUND GUIDED THORACENTESIS MEDICATIONS: 1% lidocaine 10 mL COMPLICATIONS: None immediate. PROCEDURE: An ultrasound guided thoracentesis was thoroughly discussed with the patient and questions answered. The benefits, risks, alternatives and complications were also discussed. The patient understands and wishes to proceed with the procedure. Written consent was obtained. Ultrasound was performed to localize and mark an adequate pocket of fluid in the right chest. The area was then prepped and draped in the normal sterile fashion. 1% Lidocaine was used for local  anesthesia. Under ultrasound guidance a 6 Fr Safe-T-Centesis catheter was introduced. Thoracentesis was performed. The catheter was removed and a dressing applied. FINDINGS: A total of approximately 900 mL of amber-colored fluid was removed. Samples were sent to the laboratory as requested by the clinical team. IMPRESSION: Successful ultrasound guided right thoracentesis yielding 900 mL of pleural fluid. Read by: Soyla Dryer, NP Electronically Signed   By: Corrie Mckusick D.O.   On: 11/25/2020 13:46    Cardiac Studies   TTE 11/24/20  1. Left ventricular ejection fraction, by estimation, is 55 to 60%. The  left ventricle has normal function. The left ventricle has no regional  wall motion abnormalities. Left ventricular diastolic parameters are  consistent with Grade II diastolic  dysfunction (pseudonormalization). Elevated left ventricular end-diastolic  pressure.   2. Right ventricular systolic function is normal. The right ventricular  size is normal. There is normal pulmonary artery  systolic pressure.   3. Left atrial size was severely dilated.   4. Large pericardial effusion measuring up to 2.3 cm. Right atrial  collapse noted but no right ventricular collapse or significant  respiratory inflow variation. RA pressure 8 mmHg. Findings are consistent  with early tamponade. Recommend serial follow up   and clinical correlation. Large pericardial effusion. The pericardial  effusion is circumferential. There is no evidence of cardiac tamponade.  Moderate pleural effusion in the left lateral region.   5. The mitral valve is normal in structure. No evidence of mitral valve  regurgitation. No evidence of mitral stenosis.   6. The aortic valve is tricuspid. Aortic valve regurgitation is not  visualized. No aortic stenosis is present.   7. Aortic dilatation noted. There is borderline dilatation of the  ascending aorta, measuring 36 mm.   8. The inferior vena cava is dilated in size with >50%  respiratory  variability, suggesting right atrial pressure of 8 mmHg.   9. There is a small patent foramen ovale.   Patient Profile     60 y.o. male with a PMHx of DVT on anticoagulation, HTN presented to the ED with fatigue and shortness of breath found to have a moderate pericardial effusion.  Assessment & Plan   Pericardial Effusion Continue to be hemodynamically stable without clinical signs of tamponade or hypoperfusion. Low suspicion that this will progress to tamponade, but  continue with heparin for now. Inflammatory workup in process. Continues to have lower extremity edema, likely from his hypoalbuminemia. Will defer to attending, but believe cardiology can sign off at this point and have patient follow up on an outpatient basis for repeat echocardiogram.  -Continue to monitor closely for signs of progression to tamponade -Continue IV heparin -Continue inflammatory work up per primary team  Bilateral Pleural Effusions Right Sided Exudative Pleural Effusion Right sided thoracentesis performed yesterday, drew off 900 cc's amber colored fluid. Patient tolerated the procedure well. Light's criteria positive for exudative effusion with neutrophil predominance. Cytology and gram stain pending. Autoimmune workup pending, with low C3/C4, suspect inflammatory process/hypoalbumanemia is underlying etiology. Repeat chest x-ray post thoracentesis with resolution of right sided effusion. Planning for thoracentesis of left pleural effusion today -Pending left sided thoracetnesis  -Pending cytology, gram stain, and pH  AKI Hematuria Proteinuria  Hypoalbuminemia Limited data for baseline Cr, recent studies Cr ranged from 1.4-1.9. Cr continues to improve. UA with evidence of hematuria/proteinuria.  Nephrology consulted yesterday who have initiated nephritic/nephrotic syndrome workup. Protein/Creatinine ratio of 1.6, not having nephrotic range proteinuria. Patient with low c3/c4, concerning for  autoimmune process; lupus vs MPGN vs cryoglobulinemia. HIV and Hepatitis panel negative.  Continue workup by primary team/nephrology -Pending ANA, Anti-dsDNA,  ANCA titers, protein electrophoresis, and kappa/lamba light chains -Continue bmp, electrolytes stable -Improvement of mg with supplementation  LLE DVT Continue heparin, planning for left sided thoracentesis today. Patient's inflammatory state likely etiology of his hypercoagulable state. -Continue management per primary team  Normocytic Anemia ESR and CRP elevated, iron panel and ferritin consistent with anemia of chronic disease.  -continue workup above for inflammatory state. -transfuse if hgb under 7.0   Hyperlipidemia Total cholesterol of 201 and LDL of 151. ASCVD risk score of 19.4%, 10 year risk of CV event. No hx of CV disease. Likely due to his inflammatory state. Patient will need repeat lipid panel after acute inflammatory process.   Please await attending attestation for final recommendations  For questions or updates, please contact [place] Please consult [email or web address] for  contact info under Cardiology/STEMI.    Signed, Sanjuana Letters, MD  Internal Medicine Resident PGY-2 11/26/2020, 9:39 AM

## 2020-11-26 NOTE — Progress Notes (Addendum)
ANTICOAGULATION CONSULT NOTE  Pharmacy Consult for heparin Indication: LLE popliteal DVT 05/26/20  No Known Allergies  Patient Measurements: Height: 6' (182.9 cm) Weight: 89.9 kg (198 lb 1.6 oz) IBW/kg (Calculated) : 77.6 Heparin Dosing Weight: 92kg  Vital Signs: Temp: 98.4 F (36.9 C) (08/19 1947) Temp Source: Oral (08/19 1947) BP: 161/86 (08/19 1947) Pulse Rate: 106 (08/19 1947)  Labs: Recent Labs    11/24/20 0237 11/24/20 0403 11/25/20 0107 11/25/20 0909 11/25/20 1628 11/26/20 0252 11/26/20 1221 11/26/20 1843 11/26/20 1928 11/26/20 1958  HGB 8.0*  --   --   --   --  7.4*  --   --  10.7*  --   HCT 24.6*  --   --   --   --  22.8*  --   --  31.9*  --   PLT 239  --   --   --   --  301  --   --   --   --   APTT  --   --    < > 45* 51* 49*  --   --   --  52*  HEPARINUNFRC  --   --    < >  --   --  0.37  --  0.21*  --  0.29*  CREATININE 1.74*  --   --  1.54*  --  1.49* 1.33*  --   --   --   TROPONINIHS 38* 34*  --   --   --   --   --   --   --   --    < > = values in this interval not displayed.     Estimated Creatinine Clearance: 67.3 mL/min (A) (by C-G formula based on SCr of 1.33 mg/dL (H)).   Assessment: 60 yo M with hx LLE popliteal DVT 05/26/20 on apixaban PTA. Apixaban held for procedure, last dose 8/17 @ 3:30am.  Pharmacy consulted for heparin.    aPTT 52 is subtherapeutic, heparin level is also subtherapeutic at 0.29 / 0.21 on 2100 units/hr. No bleeding noted, Hgb up to 10.7 post transfusion, platelets are normal. S/p thoracentesis 8/18.  Goal of Therapy:  Heparin level 0.3-0.7 units/ml aPTT 66-102 seconds Monitor platelets by anticoagulation protocol: Yes   Plan:  Increase heparin drip to 2300 units/hr HL and aPTT in am F/u aPTT until correlates with heparin level  Monitor daily aPTT, HL, CBC/plt Monitor for signs/symptoms of bleeding  F/u restart apixaban   Thank you for involving pharmacy in this patient's care.  Loura Back, PharmD,  BCPS Clinical Pharmacist Clinical phone for 11/26/2020 until 10p is [phone] 11/26/2020 8:50 PM  **Pharmacist phone directory can be found on [email or web address] listed under [facility]**

## 2020-11-26 NOTE — Progress Notes (Signed)
ANTICOAGULATION CONSULT NOTE   Pharmacy Consult for heparin Indication: LLE popliteal DVT 05/26/20  Labs: Recent Labs    11/23/20 1639 11/23/20 1755 11/24/20 0237 11/24/20 0403 11/25/20 0107 11/25/20 0909 11/25/20 1628 11/26/20 0252  HGB 7.9*  --  8.0*  --   --   --   --  7.4*  HCT 24.9*  --  24.6*  --   --   --   --  22.8*  PLT 272  --  239  --   --   --   --  301  APTT  --   --   --   --  39* 45* 51*  --   HEPARINUNFRC  --   --   --   --  0.78*  --   --  0.37  CREATININE 1.90*  --  1.74*  --   --  1.54*  --   --   TROPONINIHS 33* 36* 38* 34*  --   --   --   --    Assessment: 60 yo M with hx LLE popliteal DVT 05/26/20 on apixaban PTA. Apixaban held for procedure, last dose 8/17 @ 3:30am.  Pharmacy consulted for heparin.    APTT 51 is subtherapeutic and up trending slowly. No CBC 8/18. S/p thoracentesis 8/18. No reported bleeding.   Goal of Therapy:  Heparin level 0.3-0.7 units/ml Monitor platelets by anticoagulation protocol: Yes   Plan:  Continue Heparin to 2100 units/hr Monitor daily aPTT, HL, CBC/plt Monitor for signs/symptoms of bleeding  F/u restart apixaban  Thanks for allowing pharmacy to be a part of this patient's care.  Talbert Cage, PharmD Clinical Pharmacist

## 2020-11-26 NOTE — Progress Notes (Addendum)
Progress Note    Gerald Lloyd  ZOX:[id number] DOB: 08/07/1960  DOA: 11/23/2020 PCP: [facility]      Brief Narrative:    Medical records reviewed and are as summarized below:  Gerald Lloyd is a 60 y.o. 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  He complained of progressive shortness of breath and bilateral leg swelling.  He went to [facility] where CT chest and 2D echo reviewed moderate pericardial effusion.  He was referred to [facility] for further management.  He was admitted to the hospital for acute on chronic diastolic CHF.  He was also found to have large pericardial effusion without evidence of cardiac tamponade on 2D echo. EF was 55 to 60% but there was evidence of grade 2 diastolic dysfunction.  He was treated with IV Lasix.  Cardiologist was consulted to assist with management.  He also has kidney disease (unclear whether acute or chronic) hypoalbuminemia, iron deficiency anemia, elevated inflammatory markers (ferritin, ESR and CRP).  He underwent thoracentesis for right pleural effusion and 900 cc of fluid was removed.    Assessment/Plan:   Principal Problem:   Acute on chronic diastolic CHF (congestive heart failure) (HCC) Active Problems:   AKI (acute kidney injury) (HCC)   Pericardial effusion   Anemia   Dyspnea   Essential hypertension   Bilateral pleural effusion    Body mass index is 26.87 kg/m.   Acute on chronic diastolic CHF: Start oral Lasix.  Monitor BMP, daily weight and urine output.  Large pericardial effusion: No evidence of cardiac tamponade.  Monitor closely.  Follow-up with cardiologist for further recommendations.  AKI, hematuria, proteinuria, hypoalbuminemia: Creatinine is improving.  Baseline creatinine is unknown but suspect CKD  stage IIIa.  Urine protein creatinine ratio was 1.69.  Acute hepatitis panel and HIV test were negative.  No acute abnormality on renal sonogram.  Serology tests are pending.  Hold losartan for now. Outpatient follow-up with Dr. Posey Pronto, nephrologist.  History of left lower extremity DVT: He remains on IV heparin infusion.  Monitor heparin level per protocol.  Bilateral pleural effusion: S/p right-sided thoracentesis on 11/25/2020 with removal of 900 mls of amber-colored exudative fluid.  Plan for left-sided thoracentesis today   Hypomagnesemia: Improved  Iron deficiency anemia: Hemoglobin dropped to 7.4.  Transfused 1 unit of packed red blood cells for symptomatic anemia.  Risks, benefits and alternatives of blood transfusion were discussed with the patient and his family at the bedside.  He is agreeable to blood transfusion.  Serum iron 15, iron saturation 9 and ferritin 1,419.  Mildly elevated troponins: Likely from demand ischemia.  Hypertension: Losartan has been helped.  Start amlodipine.   Diet Order             Diet Heart Room service appropriate? Yes; Fluid consistency: Thin  Diet effective now                      Consultants: Cardiologist  Procedures: Right-sided thoracentesis on 11/25/2020    Medications:    sodium chloride flush  3 mL Intravenous Q12H   Continuous Infusions:  sodium chloride     heparin 2,100 Units/hr (11/26/20 0113)     Anti-infectives (From admission, onward)    None              Family Communication/Anticipated  D/C date and plan/Code Status   DVT prophylaxis:      Code Status: Full Code  Family Communication: His wife and son at the bedside Disposition Plan:    Status is: Inpatient  Remains inpatient appropriate because:Inpatient level of care appropriate due to severity of illness  Dispo: The patient is from: Home              Anticipated d/c is to: Home              Patient currently is not medically  stable to d/c.   Difficult to place patient No           Subjective:   C/o fatigue.  Breathing is better.  No cough or chest pain.  Objective:    Vitals:   11/25/20 1003 11/25/20 1935 11/26/20 0551 11/26/20 0912  BP: 135/88 (!) 133/92 131/83 (!) 150/86  Pulse: (!) 104 91 (!) 101   Resp:  '20 18 16  ' Temp: 97.8 F (36.6 C) 98.6 F (37 C) 98.4 F (36.9 C) 98.2 F (36.8 C)  TempSrc: Oral Oral Oral Oral  SpO2: 97% 100% 97% 97%  Weight:   89.9 kg   Height:       No data found.   Intake/Output Summary (Last 24 hours) at 11/26/2020 0959 Last data filed at 11/26/2020 0926 Gross per 24 hour  Intake 601.49 ml  Output 1550 ml  Net -948.51 ml   Filed Weights   11/24/20 1103 11/25/20 0100 11/26/20 0551  Weight: 92 kg 90.4 kg 89.9 kg    Exam:  GEN: NAD SKIN: No rash EYES: EOMI ENT: MMM CV: RRR PULM: CTA B ABD: soft, ND, NT, +BS CNS: AAO x 3, non focal EXT: Bilateral leg edema (2+), no erythema or tenderness          Data Reviewed:   I have personally reviewed following labs and imaging studies:  Labs: Labs show the following:   Basic Metabolic Panel: Recent Labs  Lab 11/23/20 1639 11/24/20 0237 11/25/20 0909 11/26/20 0252  NA 133* 135 136 133*  K 4.1 3.8 3.6 3.6  CL 103 102 104 102  CO2 '22 22 23 23  ' GLUCOSE 129* 99 108* 127*  BUN 35* 34* 35* 36*  CREATININE 1.90* 1.74* 1.54* 1.49*  CALCIUM 7.6* 7.6* 7.6* 7.4*  MG  --   --  1.6* 1.8  PHOS  --   --   --  3.1   GFR Estimated Creatinine Clearance: 60 mL/min (A) (by C-G formula based on SCr of 1.49 mg/dL (H)). Liver Function Tests: Recent Labs  Lab 11/23/20 1639  AST 25  ALT 21  ALKPHOS 68  BILITOT 0.5  PROT 5.1*  ALBUMIN 1.7*   No results for input(s): LIPASE, AMYLASE in the last 168 hours. No results for input(s): AMMONIA in the last 168 hours. Coagulation profile No results for input(s): INR, PROTIME in the last 168 hours.  CBC: Recent Labs  Lab 11/23/20 1639 11/24/20 0237  11/26/20 0252  WBC 12.8* 11.2* 7.4  NEUTROABS 11.0*  --   --   HGB 7.9* 8.0* 7.4*  HCT 24.9* 24.6* 22.8*  MCV 86.2 84.2 83.8  PLT 272 239 301   Cardiac Enzymes: No results for input(s): CKTOTAL, CKMB, CKMBINDEX, TROPONINI in the last 168 hours. BNP (last 3 results) No results for input(s): PROBNP in the last 8760 hours. CBG: No results for input(s): GLUCAP in the last 168 hours. D-Dimer: No results for input(s): DDIMER in the  last 72 hours. Hgb A1c: No results for input(s): HGBA1C in the last 72 hours. Lipid Profile: Recent Labs    11/25/20 0909  CHOL 201*  HDL 22*  LDLCALC 151*  TRIG 140  CHOLHDL 9.1   Thyroid function studies: No results for input(s): TSH, T4TOTAL, T3FREE, THYROIDAB in the last 72 hours.  Invalid input(s): FREET3 Anemia work up: Recent Labs    11/24/20 1806  FERRITIN 1,419*  TIBC 172*  IRON 15*   Sepsis Labs: Recent Labs  Lab 11/23/20 1639 11/24/20 0237 11/26/20 0252  WBC 12.8* 11.2* 7.4    Microbiology Recent Results (from the past 240 hour(s))  Resp Panel by RT-PCR (Flu A&B, Covid) Nasopharyngeal Swab     Status: None   Collection Time: 11/23/20 11:01 PM   Specimen: Nasopharyngeal Swab; Nasopharyngeal(NP) swabs in vial transport medium  Result Value Ref Range Status   SARS Coronavirus 2 by RT PCR NEGATIVE NEGATIVE Final    Comment: (NOTE) SARS-CoV-2 target nucleic acids are NOT DETECTED.  The SARS-CoV-2 RNA is generally detectable in upper respiratory specimens during the acute phase of infection. The lowest concentration of SARS-CoV-2 viral copies this assay can detect is 138 copies/mL. A negative result does not preclude SARS-Cov-2 infection and should not be used as the sole basis for treatment or other patient management decisions. A negative result may occur with  improper specimen collection/handling, submission of specimen other than nasopharyngeal swab, presence of viral mutation(s) within the areas targeted by this  assay, and inadequate number of viral copies(<138 copies/mL). A negative result must be combined with clinical observations, patient history, and epidemiological information. The expected result is Negative.  Fact Sheet for Patients:  EntrepreneurPulse.com.au  Fact Sheet for Healthcare Providers:  IncredibleEmployment.be  This test is no t yet approved or cleared by the [place] FDA and  has been authorized for detection and/or diagnosis of SARS-CoV-2 by FDA under an Emergency Use Authorization (EUA). This EUA will remain  in effect (meaning this test can be used) for the duration of the COVID-19 declaration under Section 564(b)(1) of the Act, 21 U.S.C.section 360bbb-3(b)(1), unless the authorization is terminated  or revoked sooner.       Influenza A by PCR NEGATIVE NEGATIVE Final   Influenza B by PCR NEGATIVE NEGATIVE Final    Comment: (NOTE) The Xpert Xpress SARS-CoV-2/FLU/RSV plus assay is intended as an aid in the diagnosis of influenza from Nasopharyngeal swab specimens and should not be used as a sole basis for treatment. Nasal washings and aspirates are unacceptable for Xpert Xpress SARS-CoV-2/FLU/RSV testing.  Fact Sheet for Patients: EntrepreneurPulse.com.au  Fact Sheet for Healthcare Providers: IncredibleEmployment.be  This test is not yet approved or cleared by the [place] FDA and has been authorized for detection and/or diagnosis of SARS-CoV-2 by FDA under an Emergency Use Authorization (EUA). This EUA will remain in effect (meaning this test can be used) for the duration of the COVID-19 declaration under Section 564(b)(1) of the Act, 21 U.S.C. section 360bbb-3(b)(1), unless the authorization is terminated or revoked.  Performed at [facility], [place] [address], [place], [place] [address]   Culture, body fluid w Gram Stain-bottle     Status: None (Preliminary result)    Collection Time: 11/25/20 12:59 PM   Specimen: Fluid  Result Value Ref Range Status   Specimen Description FLUID RIGHT PLEURAL  Final   Special Requests BOTTLES DRAWN AEROBIC AND ANAEROBIC  Final   Culture   Final    NO GROWTH <  12 HOURS Performed at [facility], [place] [address] [place], [place] [address]    Report Status PENDING  Incomplete  Gram stain     Status: None (Preliminary result)   Collection Time: 11/25/20 12:59 PM   Specimen: Fluid  Result Value Ref Range Status   Specimen Description FLUID RIGHT PLEURAL  Final   Special Requests NONE  Final   Gram Stain   Final    MODERATE WBC PRESENT,BOTH PMN AND MONONUCLEAR NO ORGANISMS SEEN Performed at [facility], [address], [place], [place] [address]    Report Status PENDING  Incomplete    Procedures and diagnostic studies:  DG Chest 1 View  Result Date: 11/25/2020 CLINICAL DATA:  Thoracentesis EXAM: CHEST  1 VIEW COMPARISON:  Multiple chest radiographs, most recently 11/25/2020. IR ultrasound, 11/25/2020. FINDINGS: Marked cardiomegaly. Hypoinflation. Resolution of previously-demonstrated small volume RIGHT pleural effusion. Small volume LEFT pleural effusion, now appears to be layering. LEFT basilar opacity silhouetting the cardiac apex and LEFT hemidiaphragm no enlarging pneumothorax. No interval osseous abnormality. IMPRESSION: Resolution of previously-demonstrated small volume RIGHT pleural effusion. No pneumothorax. Residual small volume LEFT pleural effusion. Electronically Signed   By: Michaelle Birks M.D.   On: 11/25/2020 13:30   DG Chest 2 View  Result Date: 11/25/2020 CLINICAL DATA:  Cough and shortness of breath.  Pleural effusion EXAM: CHEST - 2 VIEW COMPARISON:  11/23/2020 FINDINGS: Unchanged cardiomegaly and small pleural effusions. Mild scarring or atelectasis in the peripheral left lung. No Kerley lines or air bronchogram. No pneumothorax. IMPRESSION: Unchanged cardiomegaly and small pleural  effusions. Electronically Signed   By: Monte Fantasia M.D.   On: 11/25/2020 11:57   US RENAL  Result Date: 11/25/2020 CLINICAL DATA:  AKI EXAM: RENAL / URINARY TRACT ULTRASOUND COMPLETE COMPARISON:  None. FINDINGS: Right Kidney: Renal measurements: 13.2 x 5.2 x 5.8 cm = volume: 206 mL. Echogenicity within normal limits. No mass or hydronephrosis visualized. Left Kidney: Renal measurements: 11.8 x 6.8 x 5.5 cm = volume: 232 mL. Echogenicity within normal limits. No mass or hydronephrosis visualized. Bladder: Appears normal for degree of bladder distention. Other: None. IMPRESSION: Normal renal ultrasound. Electronically Signed   By: Merilyn Baba M.D.   On: 11/25/2020 15:02   ECHOCARDIOGRAM COMPLETE  Result Date: 11/24/2020    ECHOCARDIOGRAM REPORT   Patient Name:   JACOREY DONAWAY Date of Exam: 11/24/2020 Medical Rec #:  [id number]        Height:       72.0 in Accession #:    [id number]       Weight:       202.8 lb Date of Birth:  05-15-1960        BSA:          2.143 m Patient Age:    46 years         BP:           131/87 mmHg Patient Gender: M                HR:           105 bpm. Exam Location:  Inpatient Procedure: 2D Echo, Cardiac Doppler and Color Doppler Indications:    I50.33 Acute on chronic diastolic (congestive) heart failure  History:        Patient has no prior history of Echocardiogram examinations.                 Risk Factors:Hypertension.  Sonographer:    Jonelle Sidle  Dance RVT Referring Phys: [id number] Eben Burow  Sonographer Comments: Reading Cardiologist notified. IMPRESSIONS  1. Left ventricular ejection fraction, by estimation, is 55 to 60%. The left ventricle has normal function. The left ventricle has no regional wall motion abnormalities. Left ventricular diastolic parameters are consistent with Grade II diastolic dysfunction (pseudonormalization). Elevated left ventricular end-diastolic pressure.  2. Right ventricular systolic function is normal. The right ventricular size is  normal. There is normal pulmonary artery systolic pressure.  3. Left atrial size was severely dilated.  4. Large pericardial effusion measuring up to 2.3 cm. Right atrial collapse noted but no right ventricular collapse or significant respiratory inflow variation. RA pressure 8 mmHg. Findings are consistent with early tamponade. Recommend serial follow up  and clinical correlation. Large pericardial effusion. The pericardial effusion is circumferential. There is no evidence of cardiac tamponade. Moderate pleural effusion in the left lateral region.  5. The mitral valve is normal in structure. No evidence of mitral valve regurgitation. No evidence of mitral stenosis.  6. The aortic valve is tricuspid. Aortic valve regurgitation is not visualized. No aortic stenosis is present.  7. Aortic dilatation noted. There is borderline dilatation of the ascending aorta, measuring 36 mm.  8. The inferior vena cava is dilated in size with >50% respiratory variability, suggesting right atrial pressure of 8 mmHg.  9. There is a small patent foramen ovale. FINDINGS  Left Ventricle: Left ventricular ejection fraction, by estimation, is 55 to 60%. The left ventricle has normal function. The left ventricle has no regional wall motion abnormalities. The left ventricular internal cavity size was normal in size. There is  no left ventricular hypertrophy. Left ventricular diastolic parameters are consistent with Grade II diastolic dysfunction (pseudonormalization). Elevated left ventricular end-diastolic pressure. Right Ventricle: The right ventricular size is normal. No increase in right ventricular wall thickness. Right ventricular systolic function is normal. There is normal pulmonary artery systolic pressure. The tricuspid regurgitant velocity is 1.72 m/s, and  with an assumed right atrial pressure of 8 mmHg, the estimated right ventricular systolic pressure is 76.1 mmHg. Left Atrium: Left atrial size was severely dilated. Right  Atrium: Right atrial size was normal in size. Pericardium: Large pericardial effusion measuring up to 2.3 cm. Right atrial collapse noted but no right ventricular collapse or significant respiratory inflow variation. RA pressure 8 mmHg. Findings are consistent with early tamponade. Recommend serial follow up and clinical correlation. A large pericardial effusion is present. The pericardial effusion is circumferential. There is diastolic collapse of the right atrial wall. There is no evidence of cardiac tamponade. Mitral Valve: The mitral valve is normal in structure. No evidence of mitral valve regurgitation. No evidence of mitral valve stenosis. Tricuspid Valve: The tricuspid valve is normal in structure. Tricuspid valve regurgitation is trivial. No evidence of tricuspid stenosis. Aortic Valve: The aortic valve is tricuspid. Aortic valve regurgitation is not visualized. No aortic stenosis is present. Pulmonic Valve: The pulmonic valve was normal in structure. Pulmonic valve regurgitation is not visualized. No evidence of pulmonic stenosis. Aorta: Aortic dilatation noted. There is borderline dilatation of the ascending aorta, measuring 36 mm. Venous: The inferior vena cava is dilated in size with greater than 50% respiratory variability, suggesting right atrial pressure of 8 mmHg. IAS/Shunts: No atrial level shunt detected by color flow Doppler. A small patent foramen ovale is detected. Additional Comments: There is a moderate pleural effusion in the left lateral region.  LEFT VENTRICLE PLAX 2D LVIDd:  5.30 cm  Diastology LVIDs:         3.50 cm  LV e' medial:    7.07 cm/s LV PW:         1.06 cm  LV E/e' medial:  17.7 LV IVS:        1.20 cm  LV e' lateral:   6.85 cm/s LVOT diam:     2.10 cm  LV E/e' lateral: 18.2 LV SV:         101 LV SV Index:   47 LVOT Area:     3.46 cm  RIGHT VENTRICLE             IVC RV Basal diam:  3.00 cm     IVC diam: 2.00 cm RV Mid diam:    1.60 cm RV S prime:     15.70 cm/s TAPSE  (M-mode): 1.6 cm LEFT ATRIUM              Index       RIGHT ATRIUM           Index LA diam:        5.20 cm  2.43 cm/m  RA Area:     16.90 cm LA Vol (A2C):   133.0 ml 62.05 ml/m RA Volume:   44.80 ml  20.90 ml/m LA Vol (A4C):   82.6 ml  38.54 ml/m LA Biplane Vol: 109.0 ml 50.86 ml/m  AORTIC VALVE LVOT Vmax:   173.00 cm/s LVOT Vmean:  121.000 cm/s LVOT VTI:    0.293 m  AORTA Ao Root diam: 3.50 cm Ao Asc diam:  3.60 cm MITRAL VALVE                TRICUSPID VALVE MV Area (PHT): 4.26 cm     TR Peak grad:   11.9 mmHg MV Decel Time: 178 msec     TR Vmax:        172.39 cm/s MV E velocity: 125.00 cm/s MV A velocity: 90.80 cm/s   SHUNTS MV E/A ratio:  1.38         Systemic VTI:  0.29 m                             Systemic Diam: 2.10 cm Skeet Latch MD Electronically signed by Skeet Latch MD Signature Date/Time: 11/24/2020/2:42:36 PM    Final    IR THORACENTESIS ASP PLEURAL SPACE W/IMG GUIDE  Result Date: 11/25/2020 INDICATION: Patient recently admitted with a pericardial effusion and found to have bilateral pleural effusions. Interventional radiology asked to perform a therapeutic and diagnostic thoracentesis. EXAM: ULTRASOUND GUIDED THORACENTESIS MEDICATIONS: 1% lidocaine 10 mL COMPLICATIONS: None immediate. PROCEDURE: An ultrasound guided thoracentesis was thoroughly discussed with the patient and questions answered. The benefits, risks, alternatives and complications were also discussed. The patient understands and wishes to proceed with the procedure. Written consent was obtained. Ultrasound was performed to localize and mark an adequate pocket of fluid in the right chest. The area was then prepped and draped in the normal sterile fashion. 1% Lidocaine was used for local anesthesia. Under ultrasound guidance a 6 Fr Safe-T-Centesis catheter was introduced. Thoracentesis was performed. The catheter was removed and a dressing applied. FINDINGS: A total of approximately 900 mL of amber-colored fluid was  removed. Samples were sent to the laboratory as requested by the clinical team. IMPRESSION: Successful ultrasound guided right thoracentesis yielding 900 mL of pleural fluid. Read by: Soyla Dryer, NP Electronically  Signed   By: Corrie Mckusick D.O.   On: 11/25/2020 13:46               LOS: 3 days   Aidan Caloca  [facility]   Pager on [email or web address]. If 7PM-7AM, please contact night-coverage at [email or web address]     11/26/2020, 9:59 AM

## 2020-11-26 NOTE — Procedures (Signed)
Ultrasound-guided therapeutic left sided thoracentesis performed yielding 900 mililiters of straw colored fluid. No immediate complications.  Follow-up chest x-ray pending. EBL is < 2 ml.

## 2020-11-27 ENCOUNTER — Encounter (HOSPITAL_COMMUNITY): Payer: Self-pay | Admitting: Family Medicine

## 2020-11-27 DIAGNOSIS — M329 Systemic lupus erythematosus, unspecified: Secondary | ICD-10-CM

## 2020-11-27 DIAGNOSIS — M3214 Glomerular disease in systemic lupus erythematosus: Secondary | ICD-10-CM

## 2020-11-27 LAB — BASIC METABOLIC PANEL
Anion gap: 7 (ref 5–15)
BUN: 30 mg/dL — ABNORMAL HIGH (ref 6–20)
CO2: 22 mmol/L (ref 22–32)
Calcium: 7.7 mg/dL — ABNORMAL LOW (ref 8.9–10.3)
Chloride: 107 mmol/L (ref 98–111)
Creatinine, Ser: 1.34 mg/dL — ABNORMAL HIGH (ref 0.61–1.24)
GFR, Estimated: >60 mL/min (ref >60)
Glucose, Bld: 89 mg/dL (ref 70–99)
Potassium: 4 mmol/L (ref 3.5–5.1)
Sodium: 136 mmol/L (ref 135–145)

## 2020-11-27 LAB — CBC WITH DIFFERENTIAL/PLATELET
Abs Immature Granulocytes: 0.28 10*3/uL — ABNORMAL HIGH (ref 0.00–0.07)
Basophils Absolute: 0 10*3/uL (ref 0.0–0.1)
Basophils Relative: 0 %
Eosinophils Absolute: 0.1 10*3/uL (ref 0.0–0.5)
Eosinophils Relative: 2 %
HCT: 30.1 % — ABNORMAL LOW (ref 39.0–52.0)
Hemoglobin: 10 g/dL — ABNORMAL LOW (ref 13.0–17.0)
Immature Granulocytes: 3 %
Lymphocytes Relative: 9 %
Lymphs Abs: 0.7 10*3/uL (ref 0.7–4.0)
MCH: 27.9 pg (ref 26.0–34.0)
MCHC: 33.2 g/dL (ref 30.0–36.0)
MCV: 84.1 fL (ref 80.0–100.0)
Monocytes Absolute: 0.8 10*3/uL (ref 0.1–1.0)
Monocytes Relative: 10 %
Neutro Abs: 6.4 10*3/uL (ref 1.7–7.7)
Neutrophils Relative %: 76 %
Platelets: 360 10*3/uL (ref 150–400)
RBC: 3.58 MIL/uL — ABNORMAL LOW (ref 4.22–5.81)
RDW: 14.2 % (ref 11.5–15.5)
WBC: 8.4 10*3/uL (ref 4.0–10.5)
nRBC: 0 % (ref 0.0–0.2)

## 2020-11-27 LAB — CBC
HCT: 28.5 % — ABNORMAL LOW (ref 39.0–52.0)
Hemoglobin: 9.5 g/dL — ABNORMAL LOW (ref 13.0–17.0)
MCH: 27.7 pg (ref 26.0–34.0)
MCHC: 33.3 g/dL (ref 30.0–36.0)
MCV: 83.1 fL (ref 80.0–100.0)
Platelets: 366 10*3/uL (ref 150–400)
RBC: 3.43 MIL/uL — ABNORMAL LOW (ref 4.22–5.81)
RDW: 14.4 % (ref 11.5–15.5)
WBC: 7.9 10*3/uL (ref 4.0–10.5)
nRBC: 0 % (ref 0.0–0.2)

## 2020-11-27 LAB — TYPE AND SCREEN
ABO/RH(D): A POS
Antibody Screen: NEGATIVE
Unit division: 0

## 2020-11-27 LAB — BPAM RBC
Blood Product Expiration Date: 202209082359
ISSUE DATE / TIME: 202208191414
Unit Type and Rh: 6200

## 2020-11-27 LAB — APTT: aPTT: 68 seconds — ABNORMAL HIGH (ref 24–36)

## 2020-11-27 LAB — HEPARIN LEVEL (UNFRACTIONATED): Heparin Unfractionated: 0.36 IU/mL (ref 0.30–0.70)

## 2020-11-27 LAB — MAGNESIUM: Magnesium: 1.8 mg/dL (ref 1.7–2.4)

## 2020-11-27 MED ORDER — HYDROXYCHLOROQUINE SULFATE 200 MG PO TABS
200.0000 mg | ORAL_TABLET | Freq: Every day | ORAL | Status: DC
  Administered 2020-11-27 – 2020-11-30 (×4): 200 mg via ORAL
  Filled 2020-11-27 (×4): qty 1

## 2020-11-27 NOTE — Progress Notes (Addendum)
Progress Note    Gerald Lloyd  TGP:[id number] DOB: November 21, 1960  DOA: 11/23/2020 PCP: [facility]      Brief Narrative:    Medical records reviewed and are as summarized below:  Gerald Lloyd is a 60 y.o. 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  He complained of progressive shortness of breath and bilateral leg swelling.  He went to [facility] where CT chest and 2D echo reviewed moderate pericardial effusion.  He was referred to [facility] for further management.  He was admitted to the hospital for acute on chronic diastolic CHF.  He was also found to have large pericardial effusion without evidence of cardiac tamponade on 2D echo. EF was 55 to 60% but there was evidence of grade 2 diastolic dysfunction.  He was treated with IV Lasix.  Cardiologist was consulted to assist with management.  Eliquis was held and he was switched to IV heparin infusion while inpatient.  He had bilateral pleural effusion and he underwent bilateral thoracentesis with removal of 900 mls of exudative fluid on the right and 900 mls of fluid on the left.  Additional work-up revealed findings consistent with systemic lupus erythematosus and lupus nephritis.  He was started on hydroxychloroquine.  Nephrologist was consulted to assist with management.    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   Acute on chronic diastolic CHF: Continue Lasix.  Monitor BMP and urine  output.  Large pericardial effusion: No evidence of cardiac tamponade.  Monitor closely.  Cardiologist has signed off.  Outpatient follow-up with cardiologist recommended.  Systemic lupus erythematosus: Positive ANA, positive dsDNA antibody, positive anti-Smith antibody, low C3 and C4 complement levels.  These positive serologic markers together with constellation of clinical features are suggestive of SLE.  Diagnosis and management and potential complications were discussed with the patient, his wife and son at the bedside.  He will be started on hydroxychloroquine.  The importance of regular follow-up with ophthalmologist to screen for retinopathy/eye disease while on hydroxychloroquine was recommended.  Outpatient follow-up with rheumatologist was strongly recommended for close follow-up.  Suspected lupus nephritis, AKI, hematuria, proteinuria, hypoalbuminemia: Creatinine is trending down.  B Baseline creatinine is unknown.  Consulted nephrologist, Dr. Ronalee Belts.  He recommended kidney biopsy for further evaluation.  History of left lower extremity DVT: Continue IV heparin infusion and switch to Eliquis after renal biopsy.  Bilateral pleural effusion: S/p right-sided thoracentesis on 11/25/2020 with removal of 900 mls of amber-colored exudative fluid.  S/p left-sided thoracentesis with removal of 900 mLs of fluid on 11/26/2020.    Hypomagnesemia: Improved  Iron deficiency anemia, anemia of chronic disease: H&H improved s/p transfusion requirements of PRBCs on 11/26/2020.  Serum iron 15, iron saturation 9 and ferritin 1,419.  Mildly elevated troponins: Likely from demand ischemia.  Hypertension: Losartan has been held.  Continue amlodipine.   Diet Order             Diet Heart Room service appropriate? Yes; Fluid consistency: Thin  Diet  effective now                      Consultants: Cardiologist Nephrologist  Procedures: Right-sided thoracentesis on 11/25/2020 Left-sided  thoracentesis on 11/26/2020    Medications:    amLODipine  5 mg Oral Daily   furosemide  40 mg Oral Daily   hydroxychloroquine  200 mg Oral Daily   sodium chloride flush  3 mL Intravenous Q12H   Continuous Infusions:  sodium chloride     heparin 2,300 Units/hr (11/27/20 1321)     Anti-infectives (From admission, onward)    Start     Dose/Rate Route Frequency Ordered Stop   11/27/20 1500  hydroxychloroquine (PLAQUENIL) tablet 200 mg        200 mg Oral Daily 11/27/20 1031                Family Communication/Anticipated D/C date and plan/Code Status   DVT prophylaxis:      Code Status: Full Code  Family Communication: His wife and son at the bedside Disposition Plan:    Status is: Inpatient  Remains inpatient appropriate because:Inpatient level of care appropriate due to severity of illness  Dispo: The patient is from: Home              Anticipated d/c is to: Home              Patient currently is not medically stable to d/c.   Difficult to place patient No           Subjective:   Interval events noted.  No shortness of breath or chest pain.  Swelling in the legs is improving.  His wife and son were at the bedside.  Objective:    Vitals:   11/26/20 1800 11/26/20 1947 11/27/20 0527 11/27/20 1204  BP: (!) 151/96 (!) 161/86 (!) 161/89 (!) 145/77  Pulse:  (!) 106 93 98  Resp: 16 18 20 16   Temp: 99.1 F (37.3 C) 98.4 F (36.9 C) 97.7 F (36.5 C) 98 F (36.7 C)  TempSrc: Oral Oral Oral Oral  SpO2: 98% 97% 98% 99%  Weight:   89.7 kg   Height:       No data found.   Intake/Output Summary (Last 24 hours) at 11/27/2020 1539 Last data filed at 11/27/2020 1300 Gross per 24 hour  Intake 565 ml  Output 1275 ml  Net -710 ml   Filed Weights   11/25/20 0100 11/26/20 0551 11/27/20 0527  Weight: 90.4 kg 89.9 kg 89.7 kg    Exam:  GEN: NAD SKIN: Warm and dry EYES: No pallor or icterus ENT: MMM CV: RRR PULM: CTA B ABD: soft, ND, NT,  +BS CNS: AAO x 3, non focal EXT: Bilateral leg edema (improving). No erythema or tenderness           Data Reviewed:   I have personally reviewed following labs and imaging studies:  Labs: Labs show the following:   Basic Metabolic Panel: Recent Labs  Lab 11/24/20 0237 11/25/20 0909 11/26/20 0252 11/26/20 1221 11/27/20 0232  NA 135 136 133* 135 136  K 3.8 3.6 3.6 3.7 4.0  CL 102 104 102 103 107  CO2 22 23 23 24 22   GLUCOSE 99 108* 127* 93 89  BUN 34* 35* 36* 33* 30*  CREATININE 1.74* 1.54* 1.49* 1.33* 1.34*  CALCIUM 7.6* 7.6* 7.4* 7.5* 7.7*  MG  --  1.6* 1.8  --  1.8  PHOS  --   --  3.1  --   --    GFR Estimated Creatinine Clearance: 66.8 mL/min (A) (by C-G formula based on SCr of 1.34 mg/dL (H)). Liver Function Tests: Recent Labs  Lab 11/23/20 1639 11/26/20 1221  AST 25 24  ALT 21 19  ALKPHOS 68 61  BILITOT 0.5 0.4  PROT 5.1* 4.6*  ALBUMIN 1.7* 1.5*   No results for input(s): LIPASE, AMYLASE in the last 168 hours. No results for input(s): AMMONIA in the last 168 hours. Coagulation profile No results for input(s): INR, PROTIME in the last 168 hours.  CBC: Recent Labs  Lab 11/23/20 1639 11/24/20 0237 11/26/20 0252 11/26/20 1928 11/27/20 0232 11/27/20 1450  WBC 12.8* 11.2* 7.4  --  7.9 8.4  NEUTROABS 11.0*  --   --   --   --  6.4  HGB 7.9* 8.0* 7.4* 10.7* 9.5* 10.0*  HCT 24.9* 24.6* 22.8* 31.9* 28.5* 30.1*  MCV 86.2 84.2 83.8  --  83.1 84.1  PLT 272 239 301  --  366 360   Cardiac Enzymes: No results for input(s): CKTOTAL, CKMB, CKMBINDEX, TROPONINI in the last 168 hours. BNP (last 3 results) No results for input(s): PROBNP in the last 8760 hours. CBG: No results for input(s): GLUCAP in the last 168 hours. D-Dimer: No results for input(s): DDIMER in the last 72 hours. Hgb A1c: No results for input(s): HGBA1C in the last 72 hours. Lipid Profile: Recent Labs    11/25/20 0909  CHOL 201*  HDL 22*  LDLCALC 151*  TRIG 140  CHOLHDL 9.1    Thyroid function studies: No results for input(s): TSH, T4TOTAL, T3FREE, THYROIDAB in the last 72 hours.  Invalid input(s): FREET3 Anemia work up: Recent Labs    11/24/20 1806  FERRITIN 1,419*  TIBC 172*  IRON 15*   Sepsis Labs: Recent Labs  Lab 11/24/20 0237 11/26/20 0252 11/27/20 0232 11/27/20 1450  WBC 11.2* 7.4 7.9 8.4    Microbiology Recent Results (from the past 240 hour(s))  Resp Panel by RT-PCR (Flu A&B, Covid) Nasopharyngeal Swab     Status: None   Collection Time: 11/23/20 11:01 PM   Specimen: Nasopharyngeal Swab; Nasopharyngeal(NP) swabs in vial transport medium  Result Value Ref Range Status   SARS Coronavirus 2 by RT PCR NEGATIVE NEGATIVE Final    Comment: (NOTE) SARS-CoV-2 target nucleic acids are NOT DETECTED.  The SARS-CoV-2 RNA is generally detectable in upper respiratory specimens during the acute phase of infection. The lowest concentration of SARS-CoV-2 viral copies this assay can detect is 138 copies/mL. A negative result does not preclude SARS-Cov-2 infection and should not be used as the sole basis for treatment or other patient management decisions. A negative result may occur with  improper specimen collection/handling, submission of specimen other than nasopharyngeal swab, presence of viral mutation(s) within the areas targeted by this assay, and inadequate number of viral copies(<138 copies/mL). A negative result must be combined with clinical observations, patient history, and epidemiological information. The expected result is Negative.  Fact Sheet for Patients:  BloggerCourse.comhttps://www.fda.gov/media/152166/download  Fact Sheet for Healthcare Providers:  SeriousBroker.ithttps://www.fda.gov/media/152162/download  This test is no t yet approved or cleared by the [place] FDA and  has been authorized for detection and/or diagnosis of SARS-CoV-2 by FDA under an Emergency Use Authorization (EUA). This EUA will remain  in effect (meaning this test can be  used) for the duration of the COVID-19 declaration under Section 564(b)(1) of the Act, 21 U.S.C.section 360bbb-3(b)(1), unless the authorization is terminated  or revoked  sooner.       Influenza A by PCR NEGATIVE NEGATIVE Final   Influenza B by PCR NEGATIVE NEGATIVE Final    Comment: (NOTE) The Xpert Xpress SARS-CoV-2/FLU/RSV plus assay is intended as an aid in the diagnosis of influenza from Nasopharyngeal swab specimens and should not be used as a sole basis for treatment. Nasal washings and aspirates are unacceptable for Xpert Xpress SARS-CoV-2/FLU/RSV testing.  Fact Sheet for Patients: BloggerCourse.com  Fact Sheet for Healthcare Providers: SeriousBroker.it  This test is not yet approved or cleared by the [place] FDA and has been authorized for detection and/or diagnosis of SARS-CoV-2 by FDA under an Emergency Use Authorization (EUA). This EUA will remain in effect (meaning this test can be used) for the duration of the COVID-19 declaration under Section 564(b)(1) of the Act, 21 U.S.C. section 360bbb-3(b)(1), unless the authorization is terminated or revoked.  Performed at [facility], [address] [place], [place] [address]   Culture, body fluid w Gram Stain-bottle     Status: None (Preliminary result)   Collection Time: 11/25/20 12:59 PM   Specimen: Fluid  Result Value Ref Range Status   Specimen Description FLUID RIGHT PLEURAL  Final   Special Requests BOTTLES DRAWN AEROBIC AND ANAEROBIC  Final   Culture   Final    NO GROWTH 2 DAYS Performed at [facility], [address] [place], [place] [address]    Report Status PENDING  Incomplete  Gram stain     Status: None   Collection Time: 11/25/20 12:59 PM   Specimen: Fluid  Result Value Ref Range Status   Specimen Description FLUID RIGHT PLEURAL  Final   Special Requests NONE  Final   Gram Stain   Final    MODERATE WBC PRESENT,BOTH PMN AND  MONONUCLEAR NO ORGANISMS SEEN Performed at [facility], [address], [place], [place] [address]    Report Status 11/26/2020 FINAL  Final    Procedures and diagnostic studies:  DG Chest 1 View  Result Date: 11/26/2020 CLINICAL DATA:  Status post left thoracentesis. EXAM: CHEST  1 VIEW COMPARISON:  Chest x-ray from yesterday. FINDINGS: Stable cardiomegaly. Residual trace bilateral pleural effusions with mild bibasilar atelectasis. No pneumothorax. No acute osseous abnormality. IMPRESSION: 1. Residual trace bilateral pleural effusions and bibasilar atelectasis. No pneumothorax. Electronically Signed   By: Obie Dredge M.D.   On: 11/26/2020 12:37   IR THORACENTESIS ASP PLEURAL SPACE W/IMG GUIDE  Result Date: 11/26/2020 Alene Mires, NP     11/26/2020  1:22 PM Ultrasound-guided therapeutic left sided thoracentesis performed yielding 900 mililiters of straw colored fluid. No immediate complications.  Follow-up chest x-ray pending. EBL is < 2 ml.                  LOS: 4 days   Carmeline Kowal  [facility]   Pager on [email or web address]. If 7PM-7AM, please contact night-coverage at [email or web address]     11/27/2020, 3:39 PM

## 2020-11-27 NOTE — Consult Note (Addendum)
[facility] Nephrology Consultation Note  Requesting MD: Dr Lurene ShadowAyiku, [name] for consult: AKI, proteinuria  HPI:  Gerald Lloyd is a 60 y.o. male with history of hypertension, left lower extremity DVT on Eliquis, who was admitted with shortness of breath and pericardial effusion, seen as a consultation for the evaluation of AKI and proteinuria.  Patient reported that he had worsening shortness of breath for last few months and went to [facility].  There he had CT chest and echo with moderate pericardial effusion without tamponade.  He was sent to [facility].  He was treated for acute on chronic CHF with IV diuretics and was found to have large pericardial effusion.  EF was 55 to 60% with grade 2 diastolic dysfunction.  Seen by cardiologist.  He underwent thoracocentesis for right pleural effusion.  The serum creatinine level was 1.90 on admission which is improving to 1.34 today.  The urinalysis showed 1.69 of proteinuria with red blood cells.  The serologies were sent which was positive for ANA, double-stranded DNA with low C3 and C4.  Kappa lambda ratio unremarkable.  ANCA serology pending.  Hepatitis and HIV panel negative. Eliquis is on hold and currently on IV heparin because of ongoing requirement of the procedure.  Today, patient reported feeling much better especially after thoracocentesis.  He denies nausea, vomiting, chest pain, shortness of breath, cough, headache or dizziness.  Denies any urinary complaint. He denies any small joint pain, skin rash.  No family history of lupus or autoimmune disease. The nurse was present at the bedside.  Creatinine, Ser  Date/Time Value Ref Range Status  11/27/2020 02:32 AM 1.34 (H) 0.61 - 1.24 mg/dL Final  16/10/960408/19/2022 54:0912:21 PM 1.33 (H) 0.61 - 1.24 mg/dL Final  81/19/147808/19/2022 29:5602:52 AM 1.49 (H) 0.61 - 1.24 mg/dL Final  21/30/865708/18/2022 84:6909:09 AM 1.54 (H) 0.61 - 1.24 mg/dL Final  62/95/284108/17/2022 32:4402:37 AM  1.74 (H) 0.61 - 1.24 mg/dL Final  01/02/725308/16/2022 66:4404:39 PM 1.90 (H) 0.61 - 1.24 mg/dL Final     PMHx:   Past Medical History:  Diagnosis Date   Anemia    Hypertension     Past Surgical History:  Procedure Laterality Date   IR THORACENTESIS ASP PLEURAL SPACE W/IMG GUIDE  11/25/2020   IR THORACENTESIS ASP PLEURAL SPACE W/IMG GUIDE  11/26/2020    Family Hx: History reviewed. No pertinent family history.  Social History:  reports that he has never smoked. He has never used smokeless tobacco. He reports that he does not currently use alcohol. He reports that he does not use drugs.  Allergies: No Known Allergies  Medications: Prior to Admission medications   Medication Sig Start Date End Date Taking? Authorizing Provider  ELIQUIS 5 MG TABS tablet Take 5 mg by mouth 2 (two) times daily. 11/15/20  Yes [provider]  furosemide (LASIX) 40 MG tablet Take 40 mg by mouth daily. 11/18/20  Yes [provider]  losartan-hydrochlorothiazide (HYZAAR) 50-12.5 MG tablet Take 1 tablet by mouth daily. 11/05/20  Yes [provider]    I have reviewed the patient's current medications.  Labs:  Results for orders placed or performed during the hospital encounter of 11/23/20 (from the past 48 hour(s))  APTT     Status: Abnormal   Collection Time: 11/25/20  4:28 PM  Result Value Ref Range   aPTT 51 (H) 24 - 36 seconds    Comment:        IF BASELINE aPTT IS  ELEVATED, SUGGEST PATIENT RISK ASSESSMENT BE USED TO DETERMINE APPROPRIATE ANTICOAGULANT THERAPY. Performed at [facility], [address], [place], [place] [address]   Lactate dehydrogenase     Status: Abnormal   Collection Time: 11/25/20  4:28 PM  Result Value Ref Range   LDH 226 (H) 98 - 192 U/L    Comment: Performed at [facility], [address] [place], [place] [address]  ANA w/Reflex if Positive     Status: Abnormal   Collection Time: 11/25/20  4:28 PM  Result Value Ref Range   Anti Nuclear  Antibody (ANA) Positive (A) Negative    Comment: (NOTE) Performed At: [facility] [address] [place], [place] [id number] Jolene Schimke MD WG:[id number]   Anti-DNA antibody, double-stranded     Status: Abnormal   Collection Time: 11/25/20  4:28 PM  Result Value Ref Range   ds DNA Ab >300 (H) 0 - 9 IU/mL    Comment: (NOTE)                                   Negative      <5                                   Equivocal  5 - 9                                   Positive      >9 Performed At: [facility] [address] [place], [place] [id number] Jolene Schimke MD [id number]   C3 complement     Status: Abnormal   Collection Time: 11/25/20  4:28 PM  Result Value Ref Range   C3 Complement 61 (L) 82 - 167 mg/dL    Comment: (NOTE) Performed At: [facility] [address] [place], [place] [id number] Jolene Schimke MD [id number]   C4 complement     Status: Abnormal   Collection Time: 11/25/20  4:28 PM  Result Value Ref Range   Complement C4, Body Fluid 7 (L) 12 - 38 mg/dL    Comment: (NOTE) Performed At: [facility] [address] [place], [place] [id number] Jolene Schimke MD OA:[id number]   Kappa/lambda light chains     Status: Abnormal   Collection Time: 11/25/20  4:28 PM  Result Value Ref Range   Kappa free light chain 94.5 (H) 3.3 - 19.4 mg/L   Lambda free light chains 102.1 (H) 5.7 - 26.3 mg/L   Kappa, lambda light chain ratio 0.93 0.26 - 1.65    Comment: (NOTE) Performed At: [facility] [address] [place], [place] [id number] Jolene Schimke MD DD:[id number]   ENA+DNA/DS+ANTICH+CENTRO+JO...     Status: Abnormal   Collection Time: 11/25/20  4:28 PM  Result Value Ref Range   ds DNA Ab >300 (H) 0 - 9 IU/mL    Comment: (NOTE)                                   Negative      <5  Equivocal  5 - 9                                   Positive      >9    Ribonucleic Protein 0.3 0.0 - 0.9 AI    ENA SM Ab Ser-aCnc 1.7 (H) 0.0 - 0.9 AI   Scleroderma (Scl-70) (ENA) Antibody, IgG <0.2 0.0 - 0.9 AI   SSA (Ro) (ENA) Antibody, IgG <0.2 0.0 - 0.9 AI   SSB (La) (ENA) Antibody, IgG <0.2 0.0 - 0.9 AI   Chromatin Ab SerPl-aCnc >8.0 (H) 0.0 - 0.9 AI   Anti JO-1 <0.2 0.0 - 0.9 AI   Centromere Ab Screen <0.2 0.0 - 0.9 AI   See below: Comment     Comment: (NOTE) Autoantibody                       Disease Association ------------------------------------------------------------                        Condition                  Frequency ---------------------   ------------------------   --------- Antinuclear Antibody,    SLE, mixed connective Direct (ANA-D)           tissue diseases ---------------------   ------------------------   --------- dsDNA                    SLE                        40 - 60% ---------------------   ------------------------   --------- Chromatin                Drug induced SLE                90%                         SLE                        48 - 97% ---------------------   ------------------------   --------- SSA (Ro)                 SLE                        25 - 35%                         Sjogren's Syndrome         40 - 70%                         Neonatal Lupus                 100% ---------------------   ------------------------   --------- SSB (La)                 SLE                              10%  Sjogren's Syndrome              30% ---------------------   -----------------------    --------- Sm (anti-Smith)          SLE                        15 - 30% ---------------------   -----------------------    --------- RNP                      Mixed Connective Tissue                         Disease                         95% (U1 nRNP,                SLE                        30 - 50% anti-ribonucleoprotein)  Polymyositis and/or                         Dermatomyositis                 20% ---------------------    ------------------------   --------- Scl-70 (antiDNA          Scleroderma (diffuse)      20 - 35% topoisomerase)           Crest                           13% ---------------------   ------------------------   --------- Jo-1                     Polymyositis and/or                         Dermatomyositis            20 - 40% ---------------------   ------------------------   --------- Centromere B             Scleroderma -  Crest                         variant                         80% Performed At: [facility] [address] [place], [place] [id number] Jolene Schimke MD [id number]   CBC     Status: Abnormal   Collection Time: 11/26/20  2:52 AM  Result Value Ref Range   WBC 7.4 4.0 - 10.5 K/uL   RBC 2.72 (L) 4.22 - 5.81 MIL/uL   Hemoglobin 7.4 (L) 13.0 - 17.0 g/dL   HCT 57.8 (L) 46.9 - 62.9 %   MCV 83.8 80.0 - 100.0 fL   MCH 27.2 26.0 - 34.0 pg   MCHC 32.5 30.0 - 36.0 g/dL   RDW 52.8 41.3 - 24.4 %   Platelets 301 150 - 400 K/uL   nRBC 0.0 0.0 - 0.2 %    Comment: Performed at [facility], [address] [place], [place] [address]  Basic metabolic panel     Status: Abnormal   Collection Time: 11/26/20  2:52 AM  Result Value Ref Range   Sodium 133 (L) 135 - 145 mmol/L   Potassium 3.6 3.5 - 5.1 mmol/L   Chloride 102 98 - 111 mmol/L   CO2 23 22 - 32 mmol/L   Glucose, Bld 127 (H) 70 - 99 mg/dL    Comment: Glucose reference range applies only to samples taken after fasting for at least 8 hours.   BUN 36 (H) 6 - 20 mg/dL   Creatinine, Ser 1.61 (H) 0.61 - 1.24 mg/dL   Calcium 7.4 (L) 8.9 - 10.3 mg/dL   GFR, Estimated 54 (L) >60 mL/min    Comment: (NOTE) Calculated using the CKD-EPI Creatinine Equation (2021)    Anion gap 8 5 - 15    Comment: Performed at [facility], [address] [place], [place] [address]  Magnesium     Status: None   Collection Time: 11/26/20  2:52 AM  Result Value Ref Range   Magnesium 1.8 1.7 - 2.4 mg/dL    Comment: Performed  at [facility], [address], [place], [place] [address]  Phosphorus     Status: None   Collection Time: 11/26/20  2:52 AM  Result Value Ref Range   Phosphorus 3.1 2.5 - 4.6 mg/dL    Comment: Performed at [facility], [address], [place], [place] [address]  APTT     Status: Abnormal   Collection Time: 11/26/20  2:52 AM  Result Value Ref Range   aPTT 49 (H) 24 - 36 seconds    Comment:        IF BASELINE aPTT IS ELEVATED, SUGGEST PATIENT RISK ASSESSMENT BE USED TO DETERMINE APPROPRIATE ANTICOAGULANT THERAPY. Performed at [facility], [address] [place], [place] [address]   Heparin level (unfractionated)     Status: None   Collection Time: 11/26/20  2:52 AM  Result Value Ref Range   Heparin Unfractionated 0.37 0.30 - 0.70 IU/mL    Comment: (NOTE) The clinical reportable range upper limit is being lowered to >1.10 to align with the FDA approved guidance for the current laboratory assay.  If heparin results are below expected values, and patient dosage has  been confirmed, suggest follow up testing of antithrombin III levels. Performed at [facility], [address], [place], [place] [address]   ABO/Rh     Status: None   Collection Time: 11/26/20  2:52 AM  Result Value Ref Range   ABO/RH(D)      A POS Performed at [facility], [address] [place], [place] [address]   Type and screen [facility]     Status: None   Collection Time: 11/26/20 10:25 AM  Result Value Ref Range   ABO/RH(D) A POS    Antibody Screen NEG    Sample Expiration 11/29/2020,2359    Unit Number [id number]    Blood Component Type RED CELLS,LR    Unit division 00    Status of Unit ISSUED,FINAL    Transfusion Status OK TO TRANSFUSE    Crossmatch Result      Compatible Performed at [facility], [address] [place], [place] [address]   Prepare RBC (crossmatch)     Status: None   Collection Time: 11/26/20 10:28 AM  Result  Value Ref Range   Order Confirmation      ORDER PROCESSED BY BLOOD BANK Performed at [facility], [address], [place],  [place] [address]   Comprehensive metabolic panel     Status: Abnormal   Collection Time: 11/26/20 12:21 PM  Result Value Ref Range   Sodium 135 135 - 145 mmol/L   Potassium 3.7 3.5 - 5.1 mmol/L   Chloride 103 98 - 111 mmol/L   CO2 24 22 - 32 mmol/L   Glucose, Bld 93 70 - 99 mg/dL    Comment: Glucose reference range applies only to samples taken after fasting for at least 8 hours.   BUN 33 (H) 6 - 20 mg/dL   Creatinine, Ser 6.04 (H) 0.61 - 1.24 mg/dL   Calcium 7.5 (L) 8.9 - 10.3 mg/dL   Total Protein 4.6 (L) 6.5 - 8.1 g/dL   Albumin 1.5 (L) 3.5 - 5.0 g/dL   AST 24 15 - 41 U/L   ALT 19 0 - 44 U/L   Alkaline Phosphatase 61 38 - 126 U/L   Total Bilirubin 0.4 0.3 - 1.2 mg/dL   GFR, Estimated >54 >09 mL/min    Comment: (NOTE) Calculated using the CKD-EPI Creatinine Equation (2021)    Anion gap 8 5 - 15    Comment: Performed at [facility], [address], [place], [place] [address]  Heparin level (unfractionated)     Status: Abnormal   Collection Time: 11/26/20  6:43 PM  Result Value Ref Range   Heparin Unfractionated 0.21 (L) 0.30 - 0.70 IU/mL    Comment: (NOTE) The clinical reportable range upper limit is being lowered to >1.10 to align with the FDA approved guidance for the current laboratory assay.  If heparin results are below expected values, and patient dosage has  been confirmed, suggest follow up testing of antithrombin III levels. Performed at [facility], [address], [place], [place] [address]   Hemoglobin and hematocrit, blood     Status: Abnormal   Collection Time: 11/26/20  7:28 PM  Result Value Ref Range   Hemoglobin 10.7 (L) 13.0 - 17.0 g/dL    Comment: REPEATED TO VERIFY POST TRANSFUSION SPECIMEN DELTA CHECK NOTED    HCT 31.9 (L) 39.0 - 52.0 %    Comment: Performed at [facility], [address] [place], [place] [address]  Heparin level (unfractionated)     Status: Abnormal   Collection Time: 11/26/20  7:58 PM  Result Value Ref Range   Heparin Unfractionated 0.29 (L) 0.30 - 0.70 IU/mL    Comment: (NOTE) The clinical reportable range upper limit is being lowered to >1.10 to align with the FDA approved guidance for the current laboratory assay.  If heparin results are below expected values, and patient dosage has  been confirmed, suggest follow up testing of antithrombin III levels. Performed at [facility], [address] [place], [place] [address]   APTT     Status: Abnormal   Collection Time: 11/26/20  7:58 PM  Result Value Ref Range   aPTT 52 (H) 24 - 36 seconds    Comment:        IF BASELINE aPTT IS ELEVATED, SUGGEST PATIENT RISK ASSESSMENT BE USED TO DETERMINE APPROPRIATE ANTICOAGULANT THERAPY. Performed at [facility], [address], [place], [place] [address]   APTT     Status: Abnormal   Collection Time: 11/27/20  2:32 AM  Result Value Ref Range   aPTT 68 (H) 24 - 36 seconds    Comment:        IF BASELINE aPTT IS ELEVATED, SUGGEST PATIENT  RISK ASSESSMENT BE USED TO DETERMINE APPROPRIATE ANTICOAGULANT THERAPY. Performed at [facility], [address] [place], [place] [address]   Heparin level (unfractionated)     Status: None   Collection Time: 11/27/20  2:32 AM  Result Value Ref Range   Heparin Unfractionated 0.36 0.30 - 0.70 IU/mL    Comment: (NOTE) The clinical reportable range upper limit is being lowered to >1.10 to align with the FDA approved guidance for the current laboratory assay.  If heparin results are below expected values, and patient dosage has  been confirmed, suggest follow up testing of antithrombin III levels. Performed at [facility], [address] [place], [place] [address]   CBC     Status: Abnormal   Collection Time: 11/27/20  2:32 AM  Result Value Ref Range   WBC 7.9 4.0 - 10.5 K/uL   RBC  3.43 (L) 4.22 - 5.81 MIL/uL   Hemoglobin 9.5 (L) 13.0 - 17.0 g/dL   HCT 09.8 (L) 11.9 - 14.7 %   MCV 83.1 80.0 - 100.0 fL   MCH 27.7 26.0 - 34.0 pg   MCHC 33.3 30.0 - 36.0 g/dL   RDW 82.9 56.2 - 13.0 %   Platelets 366 150 - 400 K/uL   nRBC 0.0 0.0 - 0.2 %    Comment: Performed at [facility], [address] [place], [place] [address]  Magnesium     Status: None   Collection Time: 11/27/20  2:32 AM  Result Value Ref Range   Magnesium 1.8 1.7 - 2.4 mg/dL    Comment: Performed at [facility], [address], [place], [place] [address]  Basic metabolic panel     Status: Abnormal   Collection Time: 11/27/20  2:32 AM  Result Value Ref Range   Sodium 136 135 - 145 mmol/L   Potassium 4.0 3.5 - 5.1 mmol/L   Chloride 107 98 - 111 mmol/L   CO2 22 22 - 32 mmol/L   Glucose, Bld 89 70 - 99 mg/dL    Comment: Glucose reference range applies only to samples taken after fasting for at least 8 hours.   BUN 30 (H) 6 - 20 mg/dL   Creatinine, Ser 9.52 (H) 0.61 - 1.24 mg/dL   Calcium 7.7 (L) 8.9 - 10.3 mg/dL   GFR, Estimated >84 >13 mL/min    Comment: (NOTE) Calculated using the CKD-EPI Creatinine Equation (2021)    Anion gap 7 5 - 15    Comment: Performed at [facility], [address] [place], [place] [address]     ROS:  Pertinent items noted in HPI and remainder of comprehensive ROS otherwise negative.  Physical Exam: Vitals:   11/27/20 0527 11/27/20 1204  BP: (!) 161/89 (!) 145/77  Pulse: 93 98  Resp: 20 16  Temp: 97.7 F (36.5 C) 98 F (36.7 C)  SpO2: 98% 99%     General exam: Appears calm and comfortable  Respiratory system: Clear to auscultation. Respiratory effort normal. No wheezing or crackle Cardiovascular system: S1 & S2 heard, RRR. Trace pedal edema. Gastrointestinal system: Abdomen is nondistended, soft and nontender. Normal bowel sounds heard. Central nervous system: Alert and oriented. No focal neurological deficits. Extremities: Symmetric 5 x 5  power. Skin: No rashes, lesions or ulcers Psychiatry: Judgement and insight appear normal. Mood & affect appropriate.   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  Patient has positive ANA,  double-stranded DNA with low complements.  The finding of pericardial effusion and pleural effusion probably part of autoimmune disease.  Fortunately the creatinine level is trending down to 1.34 today. I have discussed with the patient about the benefit of kidney biopsy and the risks including bleeding, infection, loss of kidney function etc.  Patient agreed therefore IR was consulted for native kidney biopsy. Follow-up ANCA. He is currently on IV heparin which can be held before the biopsy per IR protocol. Strict ins and out, daily lab and avoid nephrotoxins.  #Pericardial effusion/pleural effusion: Status post thoracocentesis and clinically feeling better.  I think this is part of autoimmune disease/lupus.  Seen by cardiologist and on diuretics.  He will need to follow-up with rheumatologist as outpatient.  #Acute on chronic diastolic dysfunction: Initially treated with IV diuretics currently on p.o. Lasix and close to euvolemic.  #Hypertension: Losartan on hold because of AKI.  Blood pressure seems to be in acceptable range with amlodipine and Lasix.  Patient's nurse was present at bedside. Thank you for the consult.  We will follow with you.  Eamon Tantillo Jaynie Collins 11/27/2020, 1:56 PM  [employer].

## 2020-11-27 NOTE — Progress Notes (Signed)
ANTICOAGULATION CONSULT NOTE  Pharmacy Consult for heparin Indication: LLE popliteal DVT 05/26/20  No Known Allergies  Patient Measurements: Height: 6' (182.9 cm) Weight: 89.9 kg (198 lb 1.6 oz) IBW/kg (Calculated) : 77.6 Heparin Dosing Weight: 92kg  Vital Signs: Temp: 98.4 F (36.9 C) (08/19 1947) Temp Source: Oral (08/19 1947) BP: 161/86 (08/19 1947) Pulse Rate: 106 (08/19 1947)  Labs: Recent Labs    11/24/20 0403 11/25/20 0107 11/25/20 0909 11/25/20 1628 11/26/20 0252 11/26/20 1221 11/26/20 1843 11/26/20 1928 11/26/20 1958 11/27/20 0232  HGB  --   --   --    < > 7.4*  --   --  10.7*  --  9.5*  HCT  --   --   --   --  22.8*  --   --  31.9*  --  28.5*  PLT  --   --   --   --  301  --   --   --   --  366  APTT  --    < > 45*   < > 49*  --   --   --  52* 68*  HEPARINUNFRC  --    < >  --   --  0.37  --  0.21*  --  0.29* 0.36  CREATININE  --   --  1.54*  --  1.49* 1.33*  --   --   --   --   TROPONINIHS 34*  --   --   --   --   --   --   --   --   --    < > = values in this interval not displayed.     Estimated Creatinine Clearance: 67.3 mL/min (A) (by C-G formula based on SCr of 1.33 mg/dL (H)).   Assessment: 60 yo M with hx LLE popliteal DVT 05/26/20 on apixaban PTA. Apixaban held for procedure, last dose 8/17 @ 3:30am.  Pharmacy consulted for heparin.    aPTT 68 sec, heparin level 0.36 (both therapeutic but hard to tell if correlating yet). No bleeding noted.   Goal of Therapy:  Heparin level 0.3-0.7 units/ml aPTT 66-102 seconds Monitor platelets by anticoagulation protocol: Yes   Plan:  Continue heparin drip at 2300 units/hr Daily heparin level and aPTT F/u restart apixaban   Christoper Fabian, PharmD, BCPS Please see amion for complete clinical pharmacist phone list 11/27/2020 3:46 AM

## 2020-11-28 ENCOUNTER — Encounter (HOSPITAL_COMMUNITY): Payer: Self-pay | Admitting: Family Medicine

## 2020-11-28 LAB — CBC
HCT: 26.3 % — ABNORMAL LOW (ref 39.0–52.0)
Hemoglobin: 8.7 g/dL — ABNORMAL LOW (ref 13.0–17.0)
MCH: 27.9 pg (ref 26.0–34.0)
MCHC: 33.1 g/dL (ref 30.0–36.0)
MCV: 84.3 fL (ref 80.0–100.0)
Platelets: 344 10*3/uL (ref 150–400)
RBC: 3.12 MIL/uL — ABNORMAL LOW (ref 4.22–5.81)
RDW: 14.4 % (ref 11.5–15.5)
WBC: 7.1 10*3/uL (ref 4.0–10.5)
nRBC: 0 % (ref 0.0–0.2)

## 2020-11-28 LAB — RENAL FUNCTION PANEL
Albumin: 1.4 g/dL — ABNORMAL LOW (ref 3.5–5.0)
Anion gap: 6 (ref 5–15)
BUN: 23 mg/dL — ABNORMAL HIGH (ref 6–20)
CO2: 22 mmol/L (ref 22–32)
Calcium: 7.7 mg/dL — ABNORMAL LOW (ref 8.9–10.3)
Chloride: 107 mmol/L (ref 98–111)
Creatinine, Ser: 1.19 mg/dL (ref 0.61–1.24)
GFR, Estimated: >60 mL/min (ref >60)
Glucose, Bld: 95 mg/dL (ref 70–99)
Phosphorus: 2.9 mg/dL (ref 2.5–4.6)
Potassium: 3.7 mmol/L (ref 3.5–5.1)
Sodium: 135 mmol/L (ref 135–145)

## 2020-11-28 LAB — HEPARIN LEVEL (UNFRACTIONATED)
Heparin Unfractionated: 0.28 IU/mL — ABNORMAL LOW (ref 0.30–0.70)
Heparin Unfractionated: 0.47 IU/mL (ref 0.30–0.70)

## 2020-11-28 NOTE — Plan of Care (Signed)
  Problem: Education: Goal: Knowledge of General Education information will improve Description: Including pain rating scale, medication(s)/side effects and non-pharmacologic comfort measures Outcome: Progressing   Problem: Education: Goal: Ability to demonstrate management of disease process will improve Outcome: Progressing   Problem: Activity: Goal: Capacity to carry out activities will improve Outcome: Progressing   

## 2020-11-28 NOTE — Progress Notes (Signed)
ANTICOAGULATION CONSULT NOTE  Pharmacy Consult for heparin Indication: LLE popliteal DVT 05/26/20  No Known Allergies  Patient Measurements: Height: 6' (182.9 cm) Weight: 90.4 kg (199 lb 4.8 oz) IBW/kg (Calculated) : 77.6 Heparin Dosing Weight: 92kg  Vital Signs: Temp: 97.9 F (36.6 C) (08/21 0300) Temp Source: Oral (08/21 0300) BP: 156/95 (08/21 0300) Pulse Rate: 96 (08/21 0300)  Labs: Recent Labs    11/26/20 0252 11/26/20 1221 11/26/20 1843 11/26/20 1958 11/27/20 0232 11/27/20 1450 11/28/20 0424  HGB 7.4*  --    < >  --  9.5* 10.0* 8.7*  HCT 22.8*  --    < >  --  28.5* 30.1* 26.3*  PLT 301  --   --   --  366 360 344  APTT 49*  --   --  52* 68*  --   --   HEPARINUNFRC 0.37  --    < > 0.29* 0.36  --  0.28*  CREATININE 1.49* 1.33*  --   --  1.34*  --   --    < > = values in this interval not displayed.     Estimated Creatinine Clearance: 66.8 mL/min (A) (by C-G formula based on SCr of 1.34 mg/dL (H)).   Assessment: 60 yo M with hx LLE popliteal DVT 05/26/20 on apixaban PTA. Apixaban held for procedure, last dose 8/17 @ 3:30am.  Pharmacy consulted for heparin.    Heparin level down to 0.28 (subtherapeutic) on gtt at 2300 units/hr.  Goal of Therapy:  Heparin level 0.3-0.7 units/ml Monitor platelets by anticoagulation protocol: Yes   Plan:  Increase heparin drip to 2450 units/hr F/u 6 hr heparin level  Christoper Fabian, PharmD, BCPS Please see amion for complete clinical pharmacist phone list 11/28/2020 5:40 AM

## 2020-11-28 NOTE — Consult Note (Signed)
Chief Complaint: Patient was seen in consultation today for acute kidney injury  Referring Physician(s): Dr. Crista Elliot  Supervising Physician: Mir, Mauri Reading  Patient Status: [facility] - In-pt  History of Present Illness: Gerald Lloyd is a 60 y.o. 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  IR consulted for random renal biopsy at the request of Dr. Ronalee Belts.   Patient assessed at bedside.  He is interested in pursuing biopsy.  He understands the risks of the procedure.  His preference is for inpatient biopsy due to need for anticoagulation at home/discharge.    Past Medical History:  Diagnosis Date   Anemia    DVT (deep venous thrombosis) (HCC)    Hypertension     Past Surgical History:  Procedure Laterality Date   IR THORACENTESIS ASP PLEURAL SPACE W/IMG GUIDE  11/25/2020   IR THORACENTESIS ASP PLEURAL SPACE W/IMG GUIDE  11/26/2020    Allergies: Patient has no known allergies.  Medications: Prior to Admission medications   Medication Sig Start Date End Date Taking? Authorizing Provider  ELIQUIS 5 MG TABS tablet Take 5 mg by mouth 2 (two) times daily. 11/15/20  Yes [provider]  furosemide (LASIX) 40 MG tablet Take 40 mg by mouth daily. 11/18/20  Yes [provider]  losartan-hydrochlorothiazide (HYZAAR) 50-12.5 MG tablet Take 1 tablet by mouth daily. 11/05/20  Yes [provider]     History reviewed. No pertinent family history.  Social History   Socioeconomic History   Marital status: Married    Spouse name: Not on file   Number of children: Not on file   Years of education: Not on file   Highest education level: Not on file  Occupational History   Not on file  Tobacco Use   Smoking status: Never    Smokeless tobacco: Never  Substance and Sexual Activity   Alcohol use: Not Currently    Comment: Drinks one beer every few weeks   Drug use: Never   Sexual activity: Not on file  Other Topics Concern   Not on file  Social History Narrative   Not on file   Social Determinants of Health   Financial Resource Strain: Not on file  Food Insecurity: Not on file  Transportation Needs: Not on file  Physical Activity: Not on file  Stress: Not on file  Social Connections: Not on file     Review of Systems: A 12 point ROS discussed and pertinent positives are indicated in the HPI above.  All other systems are negative.  Review of Systems  Constitutional:  Negative for fatigue and fever.  Respiratory:  Positive for shortness of breath. Negative for cough.   Cardiovascular:  Positive for chest pain.  Gastrointestinal:  Negative for abdominal pain, diarrhea, nausea and vomiting.  Musculoskeletal:  Negative for back pain.  Psychiatric/Behavioral:  Negative for behavioral problems and confusion.    Vital Signs: BP (!) 156/98 (BP Location: Right Arm)   Pulse 93   Temp 97.9 F (36.6 C) (Oral)   Resp 18   Ht 6' (1.829 m)   Wt 199 lb 4.8 oz (90.4 kg)   SpO2 99%   BMI 27.03 kg/m   Physical Exam Vitals and nursing note reviewed.  Constitutional:      Appearance: He is well-developed.  HENT:     Mouth/Throat:     Mouth: Mucous membranes are moist.     Pharynx: Oropharynx is clear.  Cardiovascular:     Rate and Rhythm: Normal rate and regular rhythm.  Pulmonary:     Effort: Pulmonary effort is normal.     Breath sounds: Normal breath sounds.  Skin:    General: Skin is warm and dry.  Neurological:     General: No focal deficit present.     Mental Status: He is alert.  Psychiatric:        Mood and Affect: Mood normal.        Behavior: Behavior normal.     MD Evaluation Airway: WNL Heart: WNL Abdomen: WNL Chest/ Lungs: WNL ASA  Classification: 3 Mallampati/Airway  Score: Two   Imaging: DG Chest 1 View  Result Date: 11/26/2020 CLINICAL DATA:  Status post left thoracentesis. EXAM: CHEST  1 VIEW COMPARISON:  Chest x-ray from yesterday. FINDINGS: Stable cardiomegaly. Residual trace bilateral pleural effusions with mild bibasilar atelectasis. No pneumothorax. No acute osseous abnormality. IMPRESSION: 1. Residual trace bilateral pleural effusions and bibasilar atelectasis. No pneumothorax. Electronically Signed   By: Obie Dredge M.D.   On: 11/26/2020 12:37   DG Chest 1 View  Result Date: 11/25/2020 CLINICAL DATA:  Thoracentesis EXAM: CHEST  1 VIEW COMPARISON:  Multiple chest radiographs, most recently 11/25/2020. IR ultrasound, 11/25/2020. FINDINGS: Marked cardiomegaly. Hypoinflation. Resolution of previously-demonstrated small volume RIGHT pleural effusion. Small volume LEFT pleural effusion, now appears to be layering. LEFT basilar opacity silhouetting the cardiac apex and LEFT hemidiaphragm no enlarging pneumothorax. No interval osseous abnormality. IMPRESSION: Resolution of previously-demonstrated small volume RIGHT pleural effusion. No pneumothorax. Residual small volume LEFT pleural effusion. Electronically Signed   By: Roanna Banning M.D.   On: 11/25/2020 13:30   DG Chest 2 View  Result Date: 11/25/2020 CLINICAL DATA:  Cough and shortness of breath.  Pleural effusion EXAM: CHEST - 2 VIEW COMPARISON:  11/23/2020 FINDINGS: Unchanged cardiomegaly and small pleural effusions. Mild scarring or atelectasis in the peripheral left lung. No Kerley lines or air bronchogram. No pneumothorax. IMPRESSION: Unchanged cardiomegaly and small pleural effusions. Electronically Signed   By: Marnee Spring M.D.   On: 11/25/2020 11:57   DG Chest 2 View  Result Date: 11/23/2020 CLINICAL DATA:  Cough for 2 months.  Shortness of breath. EXAM: CHEST - 2 VIEW COMPARISON:  Chest CTA 1 week ago 11/16/2020 at [facility] FINDINGS: Cardiomegaly is similar to prior exam. There are  small bilateral pleural effusions that have improved from prior. Associated compressive atelectasis. Improved vascular congestion from prior. Subsegmental atelectasis in the left mid lung zone. No pneumothorax or confluent airspace disease. IMPRESSION: 1. Vascular congestion and pleural effusions have improved from CT 1 week ago. There are small residual pleural effusions with atelectasis. 2. Cardiomegaly is similar. 3. No acute airspace disease. Electronically Signed   By: Narda Rutherford M.D.   On: 11/23/2020 16:45   US RENAL  Result Date: 11/25/2020 CLINICAL DATA:  AKI EXAM: RENAL / URINARY TRACT ULTRASOUND COMPLETE COMPARISON:  None. FINDINGS: Right Kidney: Renal measurements: 13.2 x 5.2 x 5.8 cm = volume: 206 mL. Echogenicity within normal limits. No mass or hydronephrosis visualized. Left Kidney: Renal measurements: 11.8 x 6.8 x 5.5 cm = volume: 232 mL. Echogenicity within normal limits. No mass or hydronephrosis visualized. Bladder: Appears normal for degree of bladder distention. Other: None. IMPRESSION: Normal renal ultrasound. Electronically Signed   By: Wiliam Ke M.D.   On:  11/25/2020 15:02   ECHOCARDIOGRAM COMPLETE  Result Date: 11/24/2020    ECHOCARDIOGRAM REPORT   Patient Name:   MESIAH MANZO Date of Exam: 11/24/2020 Medical Rec #:  [id number]        Height:       72.0 in Accession #:    [id number]       Weight:       202.8 lb Date of Birth:  06-Apr-1961        BSA:          2.143 m Patient Age:    59 years         BP:           131/87 mmHg Patient Gender: M                HR:           105 bpm. Exam Location:  Inpatient Procedure: 2D Echo, Cardiac Doppler and Color Doppler Indications:    I50.33 Acute on chronic diastolic (congestive) heart failure  History:        Patient has no prior history of Echocardiogram examinations.                 Risk Factors:Hypertension.  Sonographer:    Elmarie Shiley Dance RVT Referring Phys: [id number] Carlton Adam  Sonographer Comments: Reading Cardiologist  notified. IMPRESSIONS  1. Left ventricular ejection fraction, by estimation, is 55 to 60%. The left ventricle has normal function. The left ventricle has no regional wall motion abnormalities. Left ventricular diastolic parameters are consistent with Grade II diastolic dysfunction (pseudonormalization). Elevated left ventricular end-diastolic pressure.  2. Right ventricular systolic function is normal. The right ventricular size is normal. There is normal pulmonary artery systolic pressure.  3. Left atrial size was severely dilated.  4. Large pericardial effusion measuring up to 2.3 cm. Right atrial collapse noted but no right ventricular collapse or significant respiratory inflow variation. RA pressure 8 mmHg. Findings are consistent with early tamponade. Recommend serial follow up  and clinical correlation. Large pericardial effusion. The pericardial effusion is circumferential. There is no evidence of cardiac tamponade. Moderate pleural effusion in the left lateral region.  5. The mitral valve is normal in structure. No evidence of mitral valve regurgitation. No evidence of mitral stenosis.  6. The aortic valve is tricuspid. Aortic valve regurgitation is not visualized. No aortic stenosis is present.  7. Aortic dilatation noted. There is borderline dilatation of the ascending aorta, measuring 36 mm.  8. The inferior vena cava is dilated in size with >50% respiratory variability, suggesting right atrial pressure of 8 mmHg.  9. There is a small patent foramen ovale. FINDINGS  Left Ventricle: Left ventricular ejection fraction, by estimation, is 55 to 60%. The left ventricle has normal function. The left ventricle has no regional wall motion abnormalities. The left ventricular internal cavity size was normal in size. There is  no left ventricular hypertrophy. Left ventricular diastolic parameters are consistent with Grade II diastolic dysfunction (pseudonormalization). Elevated left ventricular end-diastolic  pressure. Right Ventricle: The right ventricular size is normal. No increase in right ventricular wall thickness. Right ventricular systolic function is normal. There is normal pulmonary artery systolic pressure. The tricuspid regurgitant velocity is 1.72 m/s, and  with an assumed right atrial pressure of 8 mmHg, the estimated right ventricular systolic pressure is 19.9 mmHg. Left Atrium: Left atrial size was severely dilated. Right Atrium: Right atrial size was normal in size. Pericardium: Large pericardial effusion measuring up to 2.3 cm.  Right atrial collapse noted but no right ventricular collapse or significant respiratory inflow variation. RA pressure 8 mmHg. Findings are consistent with early tamponade. Recommend serial follow up and clinical correlation. A large pericardial effusion is present. The pericardial effusion is circumferential. There is diastolic collapse of the right atrial wall. There is no evidence of cardiac tamponade. Mitral Valve: The mitral valve is normal in structure. No evidence of mitral valve regurgitation. No evidence of mitral valve stenosis. Tricuspid Valve: The tricuspid valve is normal in structure. Tricuspid valve regurgitation is trivial. No evidence of tricuspid stenosis. Aortic Valve: The aortic valve is tricuspid. Aortic valve regurgitation is not visualized. No aortic stenosis is present. Pulmonic Valve: The pulmonic valve was normal in structure. Pulmonic valve regurgitation is not visualized. No evidence of pulmonic stenosis. Aorta: Aortic dilatation noted. There is borderline dilatation of the ascending aorta, measuring 36 mm. Venous: The inferior vena cava is dilated in size with greater than 50% respiratory variability, suggesting right atrial pressure of 8 mmHg. IAS/Shunts: No atrial level shunt detected by color flow Doppler. A small patent foramen ovale is detected. Additional Comments: There is a moderate pleural effusion in the left lateral region.  LEFT VENTRICLE  PLAX 2D LVIDd:         5.30 cm  Diastology LVIDs:         3.50 cm  LV e' medial:    7.07 cm/s LV PW:         1.06 cm  LV E/e' medial:  17.7 LV IVS:        1.20 cm  LV e' lateral:   6.85 cm/s LVOT diam:     2.10 cm  LV E/e' lateral: 18.2 LV SV:         101 LV SV Index:   47 LVOT Area:     3.46 cm  RIGHT VENTRICLE             IVC RV Basal diam:  3.00 cm     IVC diam: 2.00 cm RV Mid diam:    1.60 cm RV S prime:     15.70 cm/s TAPSE (M-mode): 1.6 cm LEFT ATRIUM              Index       RIGHT ATRIUM           Index LA diam:        5.20 cm  2.43 cm/m  RA Area:     16.90 cm LA Vol (A2C):   133.0 ml 62.05 ml/m RA Volume:   44.80 ml  20.90 ml/m LA Vol (A4C):   82.6 ml  38.54 ml/m LA Biplane Vol: 109.0 ml 50.86 ml/m  AORTIC VALVE LVOT Vmax:   173.00 cm/s LVOT Vmean:  121.000 cm/s LVOT VTI:    0.293 m  AORTA Ao Root diam: 3.50 cm Ao Asc diam:  3.60 cm MITRAL VALVE                TRICUSPID VALVE MV Area (PHT): 4.26 cm     TR Peak grad:   11.9 mmHg MV Decel Time: 178 msec     TR Vmax:        172.39 cm/s MV E velocity: 125.00 cm/s MV A velocity: 90.80 cm/s   SHUNTS MV E/A ratio:  1.38         Systemic VTI:  0.29 m  Systemic Diam: 2.10 cm Chilton Si MD Electronically signed by Chilton Si MD Signature Date/Time: 11/24/2020/2:42:36 PM    Final    IR THORACENTESIS ASP PLEURAL SPACE W/IMG GUIDE  Result Date: 11/26/2020 Alene Mires, NP     11/26/2020  1:22 PM Ultrasound-guided therapeutic left sided thoracentesis performed yielding 900 mililiters of straw colored fluid. No immediate complications.  Follow-up chest x-ray pending. EBL is < 2 ml.      IR THORACENTESIS ASP PLEURAL SPACE W/IMG GUIDE  Result Date: 11/25/2020 INDICATION: Patient recently admitted with a pericardial effusion and found to have bilateral pleural effusions. Interventional radiology asked to perform a therapeutic and diagnostic thoracentesis. EXAM: ULTRASOUND GUIDED THORACENTESIS MEDICATIONS: 1%  lidocaine 10 mL COMPLICATIONS: None immediate. PROCEDURE: An ultrasound guided thoracentesis was thoroughly discussed with the patient and questions answered. The benefits, risks, alternatives and complications were also discussed. The patient understands and wishes to proceed with the procedure. Written consent was obtained. Ultrasound was performed to localize and mark an adequate pocket of fluid in the right chest. The area was then prepped and draped in the normal sterile fashion. 1% Lidocaine was used for local anesthesia. Under ultrasound guidance a 6 Fr Safe-T-Centesis catheter was introduced. Thoracentesis was performed. The catheter was removed and a dressing applied. FINDINGS: A total of approximately 900 mL of amber-colored fluid was removed. Samples were sent to the laboratory as requested by the clinical team. IMPRESSION: Successful ultrasound guided right thoracentesis yielding 900 mL of pleural fluid. Read by: Alwyn Ren, NP Electronically Signed   By: Gilmer Mor D.O.   On: 11/25/2020 13:46    Labs:  CBC: Recent Labs    11/26/20 0252 11/26/20 1928 11/27/20 0232 11/27/20 1450 11/28/20 0424  WBC 7.4  --  7.9 8.4 7.1  HGB 7.4* 10.7* 9.5* 10.0* 8.7*  HCT 22.8* 31.9* 28.5* 30.1* 26.3*  PLT 301  --  366 360 344    COAGS: Recent Labs    11/25/20 1628 11/26/20 0252 11/26/20 1958 11/27/20 0232  APTT 51* 49* 52* 68*    BMP: Recent Labs    11/26/20 0252 11/26/20 1221 11/27/20 0232 11/28/20 0424  NA 133* 135 136 135  K 3.6 3.7 4.0 3.7  CL 102 103 107 107  CO2 GLUCOSE 127* 93 89 95  BUN 36* 33* 30* 23*  CALCIUM 7.4* 7.5* 7.7* 7.7*  CREATININE 1.49* 1.33* 1.34* 1.19  GFRNONAA 54* >60 >60 >60    LIVER FUNCTION TESTS: Recent Labs    11/23/20 1639 11/26/20 1221 11/28/20 0424  BILITOT 0.5 0.4  --   AST 25 24  --   ALT 21 19  --   ALKPHOS 68 61  --   PROT 5.1* 4.6*  --   ALBUMIN 1.7* 1.5* 1.4*    TUMOR MARKERS: No results for input(s):  AFPTM, CEA, CA199, CHROMGRNA in the last 8760 hours.  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  Discussed with patient and wife at bedside.  They are understanding of the goals and risks of the procedure.  They wish to proceed.  NPO p MN. On heparin drip.  S/p bilateral thoracentesis with IR this past week. Also with pericardial effusion, tachycardia. BP 156/98 today. INR ordered.   Risks and benefits of biopsy was discussed with the patient and/or patient's family including, but not limited to bleeding, infection, damage to adjacent structures or  low yield requiring additional tests.  All of the questions were answered and there is agreement to proceed.  Consent signed and in chart.  Thank you for this interesting consult.  I greatly enjoyed meeting Almus Woodham and look forward to participating in their care.  A copy of this report was sent to the requesting provider on this date.  Electronically Signed: Hoyt Koch, PA 11/28/2020, 2:57 PM   I spent a total of 40 Minutes    in face to face in clinical consultation, greater than 50% of which was counseling/coordinating care for acute kidney injury.

## 2020-11-28 NOTE — Progress Notes (Signed)
Progress Note    Gerald Lloyd  YTK:[id number] DOB: 07-27-1960  DOA: 11/23/2020 PCP: [facility]      Brief Narrative:    Medical records reviewed and are as summarized below:  Gerald Lloyd is a 60 y.o. 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  He complained of progressive shortness of breath and bilateral leg swelling.  He went to [facility] where CT chest and 2D echo reviewed moderate pericardial effusion.  He was referred to [facility] for further management.  He was admitted to the hospital for acute on chronic diastolic CHF.  He was also found to have large pericardial effusion without evidence of cardiac tamponade on 2D echo. EF was 55 to 60% but there was evidence of grade 2 diastolic dysfunction.  He was treated with IV Lasix.  Cardiologist was consulted to assist with management.  Eliquis was held and he was switched to IV heparin infusion while inpatient.  He had bilateral pleural effusion and he underwent bilateral thoracentesis with removal of 900 mls of exudative fluid on the right and 900 mls of fluid on the left.  Additional work-up revealed findings consistent with systemic lupus erythematosus and lupus nephritis.  He was started on hydroxychloroquine.  Nephrologist was consulted to assist with management.    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   Acute on chronic diastolic CHF: Continue Lasix.  He has adequate urine output.   Monitor BMP.  Large pericardial effusion: No evidence of cardiac tamponade.  Monitor closely.  Cardiologist has signed off.  Outpatient follow-up with cardiologist recommended.  Systemic lupus erythematosus: Positive ANA, positive dsDNA antibody, positive anti-Smith antibody, low C3 and C4 complement levels.  These positive serologic markers together with constellation of clinical features are suggestive of SLE.  Diagnosis and management and potential complications were discussed with the patient, his wife and son at the bedside.  He will be started on hydroxychloroquine.  The importance of regular follow-up with ophthalmologist to screen for retinopathy/eye disease while on hydroxychloroquine was recommended.  Outpatient follow-up with rheumatologist was strongly recommended for close follow-up.  Suspected lupus nephritis, AKI, hematuria, proteinuria, hypoalbuminemia: Creatinine continues to improve.  Plan for kidney biopsy tomorrow.  Follow-up with nephrologist and IR.  History of left lower extremity DVT (February 2022): Continue IV heparin infusion with plan to discontinue prior to kidney biopsy.  Bilateral pleural effusion: S/p right-sided thoracentesis on 11/25/2020 with removal of 900 mls of amber-colored exudative fluid.  S/p left-sided thoracentesis with removal of 900 mLs of fluid on 11/26/2020.    Hypomagnesemia: Improved  Iron deficiency anemia, anemia of chronic disease: H&H improved s/p transfusion requirements of PRBCs on 11/26/2020.  Serum iron 15, iron saturation 9 and ferritin 1,419.  Mildly elevated troponins: Likely from demand ischemia.  Hypertension: Losartan has been held.  Continue amlodipine.   Diet Order             Diet Heart Room service appropriate? Yes; Fluid consistency: Thin  Diet effective  now                      Consultants: Cardiologist Nephrologist  Procedures: Right-sided thoracentesis on 11/25/2020 Left-sided thoracentesis on  11/26/2020    Medications:    amLODipine  5 mg Oral Daily   furosemide  40 mg Oral Daily   hydroxychloroquine  200 mg Oral Daily   sodium chloride flush  3 mL Intravenous Q12H   Continuous Infusions:  sodium chloride     heparin 2,450 Units/hr (11/28/20 0550)     Anti-infectives (From admission, onward)    Start     Dose/Rate Route Frequency Ordered Stop   11/27/20 1500  hydroxychloroquine (PLAQUENIL) tablet 200 mg        200 mg Oral Daily 11/27/20 1031                Family Communication/Anticipated D/C date and plan/Code Status   DVT prophylaxis:      Code Status: Full Code  Family Communication: His wife and son at the bedside Disposition Plan:    Status is: Inpatient  Remains inpatient appropriate because:Inpatient level of care appropriate due to severity of illness  Dispo: The patient is from: Home              Anticipated d/c is to: Home              Patient currently is not medically stable to d/c.   Difficult to place patient No           Subjective:   Interval events noted.  No complaints.  No shortness of breath or chest pain.  Objective:    Vitals:   11/27/20 1928 11/28/20 0300 11/28/20 0300 11/28/20 0304  BP: (!) 155/89 (!) 156/95 (!) 156/95   Pulse: 97 96 96   Resp: 16 16 16    Temp: 99.3 F (37.4 C) 97.9 F (36.6 C) 97.9 F (36.6 C)   TempSrc: Oral Oral Oral   SpO2: 100%  99%   Weight:    90.4 kg  Height:       No data found.   Intake/Output Summary (Last 24 hours) at 11/28/2020 1120 Last data filed at 11/28/2020 0800 Gross per 24 hour  Intake 480 ml  Output 650 ml  Net -170 ml   Filed Weights   11/26/20 0551 11/27/20 0527 11/28/20 0304  Weight: 89.9 kg 89.7 kg 90.4 kg    Exam:  GEN: NAD SKIN: No rash EYES: EOMI ENT: MMM CV: RRR PULM: CTA B ABD: soft, ND, NT, +BS CNS: AAO x 3, non focal EXT: Bilateral leg edema (1+) no tenderness or erythema            Data Reviewed:   I have personally  reviewed following labs and imaging studies:  Labs: Labs show the following:   Basic Metabolic Panel: Recent Labs  Lab 11/25/20 0909 11/26/20 0252 11/26/20 1221 11/27/20 0232 11/28/20 0424  NA 136 133* 135 136 135  K 3.6 3.6 3.7 4.0 3.7  CL 104 102 103 107 107  CO2 23 23 24 22 22   GLUCOSE 108* 127* 93 89 95  BUN 35* 36* 33* 30* 23*  CREATININE 1.54* 1.49* 1.33* 1.34* 1.19  CALCIUM 7.6* 7.4* 7.5* 7.7* 7.7*  MG 1.6* 1.8  --  1.8  --   PHOS  --  3.1  --   --  2.9   GFR Estimated Creatinine Clearance: 75.2 mL/min (by C-G formula based on SCr of  1.19 mg/dL). Liver Function Tests: Recent Labs  Lab 11/23/20 1639 11/26/20 1221 11/28/20 0424  AST 25 24  --   ALT 21 19  --   ALKPHOS 68 61  --   BILITOT 0.5 0.4  --   PROT 5.1* 4.6*  --   ALBUMIN 1.7* 1.5* 1.4*   No results for input(s): LIPASE, AMYLASE in the last 168 hours. No results for input(s): AMMONIA in the last 168 hours. Coagulation profile No results for input(s): INR, PROTIME in the last 168 hours.  CBC: Recent Labs  Lab 11/23/20 1639 11/24/20 0237 11/26/20 0252 11/26/20 1928 11/27/20 0232 11/27/20 1450 11/28/20 0424  WBC 12.8* 11.2* 7.4  --  7.9 8.4 7.1  NEUTROABS 11.0*  --   --   --   --  6.4  --   HGB 7.9* 8.0* 7.4* 10.7* 9.5* 10.0* 8.7*  HCT 24.9* 24.6* 22.8* 31.9* 28.5* 30.1* 26.3*  MCV 86.2 84.2 83.8  --  83.1 84.1 84.3  PLT 272 239 301  --  366 360 344   Cardiac Enzymes: No results for input(s): CKTOTAL, CKMB, CKMBINDEX, TROPONINI in the last 168 hours. BNP (last 3 results) No results for input(s): PROBNP in the last 8760 hours. CBG: No results for input(s): GLUCAP in the last 168 hours. D-Dimer: No results for input(s): DDIMER in the last 72 hours. Hgb A1c: No results for input(s): HGBA1C in the last 72 hours. Lipid Profile: No results for input(s): CHOL, HDL, LDLCALC, TRIG, CHOLHDL, LDLDIRECT in the last 72 hours.  Thyroid function studies: No results for input(s): TSH, T4TOTAL,  T3FREE, THYROIDAB in the last 72 hours.  Invalid input(s): FREET3 Anemia work up: No results for input(s): VITAMINB12, FOLATE, FERRITIN, TIBC, IRON, RETICCTPCT in the last 72 hours.  Sepsis Labs: Recent Labs  Lab 11/26/20 0252 11/27/20 0232 11/27/20 1450 11/28/20 0424  WBC 7.4 7.9 8.4 7.1    Microbiology Recent Results (from the past 240 hour(s))  Resp Panel by RT-PCR (Flu A&B, Covid) Nasopharyngeal Swab     Status: None   Collection Time: 11/23/20 11:01 PM   Specimen: Nasopharyngeal Swab; Nasopharyngeal(NP) swabs in vial transport medium  Result Value Ref Range Status   SARS Coronavirus 2 by RT PCR NEGATIVE NEGATIVE Final    Comment: (NOTE) SARS-CoV-2 target nucleic acids are NOT DETECTED.  The SARS-CoV-2 RNA is generally detectable in upper respiratory specimens during the acute phase of infection. The lowest concentration of SARS-CoV-2 viral copies this assay can detect is 138 copies/mL. A negative result does not preclude SARS-Cov-2 infection and should not be used as the sole basis for treatment or other patient management decisions. A negative result may occur with  improper specimen collection/handling, submission of specimen other than nasopharyngeal swab, presence of viral mutation(s) within the areas targeted by this assay, and inadequate number of viral copies(<138 copies/mL). A negative result must be combined with clinical observations, patient history, and epidemiological information. The expected result is Negative.  Fact Sheet for Patients:  BloggerCourse.com  Fact Sheet for Healthcare Providers:  SeriousBroker.it  This test is no t yet approved or cleared by the [place] FDA and  has been authorized for detection and/or diagnosis of SARS-CoV-2 by FDA under an Emergency Use Authorization (EUA). This EUA will remain  in effect (meaning this test can be used) for the duration of the COVID-19  declaration under Section 564(b)(1) of the Act, 21 U.S.C.section 360bbb-3(b)(1), unless the authorization is terminated  or revoked sooner.  Influenza A by PCR NEGATIVE NEGATIVE Final   Influenza B by PCR NEGATIVE NEGATIVE Final    Comment: (NOTE) The Xpert Xpress SARS-CoV-2/FLU/RSV plus assay is intended as an aid in the diagnosis of influenza from Nasopharyngeal swab specimens and should not be used as a sole basis for treatment. Nasal washings and aspirates are unacceptable for Xpert Xpress SARS-CoV-2/FLU/RSV testing.  Fact Sheet for Patients: BloggerCourse.com  Fact Sheet for Healthcare Providers: SeriousBroker.it  This test is not yet approved or cleared by the [place] FDA and has been authorized for detection and/or diagnosis of SARS-CoV-2 by FDA under an Emergency Use Authorization (EUA). This EUA will remain in effect (meaning this test can be used) for the duration of the COVID-19 declaration under Section 564(b)(1) of the Act, 21 U.S.C. section 360bbb-3(b)(1), unless the authorization is terminated or revoked.  Performed at [facility], [address] [place], [place] [address]   Culture, body fluid w Gram Stain-bottle     Status: None (Preliminary result)   Collection Time: 11/25/20 12:59 PM   Specimen: Fluid  Result Value Ref Range Status   Specimen Description FLUID RIGHT PLEURAL  Final   Special Requests BOTTLES DRAWN AEROBIC AND ANAEROBIC  Final   Culture   Final    NO GROWTH 2 DAYS Performed at [facility], [address], [place], [place] [address]    Report Status PENDING  Incomplete  Gram stain     Status: None   Collection Time: 11/25/20 12:59 PM   Specimen: Fluid  Result Value Ref Range Status   Specimen Description FLUID RIGHT PLEURAL  Final   Special Requests NONE  Final   Gram Stain   Final    MODERATE WBC PRESENT,BOTH PMN AND MONONUCLEAR NO ORGANISMS SEEN Performed  at [facility], [address], [place], [place] [address]    Report Status 11/26/2020 FINAL  Final    Procedures and diagnostic studies:  DG Chest 1 View  Result Date: 11/26/2020 CLINICAL DATA:  Status post left thoracentesis. EXAM: CHEST  1 VIEW COMPARISON:  Chest x-ray from yesterday. FINDINGS: Stable cardiomegaly. Residual trace bilateral pleural effusions with mild bibasilar atelectasis. No pneumothorax. No acute osseous abnormality. IMPRESSION: 1. Residual trace bilateral pleural effusions and bibasilar atelectasis. No pneumothorax. Electronically Signed   By: Obie Dredge M.D.   On: 11/26/2020 12:37   IR THORACENTESIS ASP PLEURAL SPACE W/IMG GUIDE  Result Date: 11/26/2020 Alene Mires, NP     11/26/2020  1:22 PM Ultrasound-guided therapeutic left sided thoracentesis performed yielding 900 mililiters of straw colored fluid. No immediate complications.  Follow-up chest x-ray pending. EBL is < 2 ml.                  LOS: 5 days   Thoma Paulsen  [facility]   Pager on [email or web address]. If 7PM-7AM, please contact night-coverage at [email or web address]     11/28/2020, 11:20 AM

## 2020-11-28 NOTE — Progress Notes (Signed)
Gerald Lloyd  Assessment/ Plan: Pt is a 60 y.o. yo male  with history of hypertension, left lower extremity DVT on Eliquis, who was admitted with shortness of breath and pericardial effusion, seen as a consultation for the evaluation of AKI and proteinuria.  #Acute kidney injury with sub-nephrotic range proteinuria/microscopic hematuria presumably due to lupus nephritis.  Patient has positive ANA, elevated double-stranded DNA with low complements.  The finding of pericardial effusion and pleural effusion probably part of autoimmune disease.  Fortunately the creatinine level is trending down to 1.19 today. I have discussed with the patient about the benefit of kidney biopsy and the risks including bleeding, infection, loss of kidney function etc.  Patient agreed therefore IR was consulted for native kidney biopsy.He is currently on IV heparin which can be held before the biopsy per IR protocol. Follow-up ANCA. Strict ins and out, daily lab and avoid nephrotoxins.   #Pericardial effusion/pleural effusion: Status post thoracocentesis and clinically feeling better.  I think this is part of autoimmune disease/lupus.  Seen by cardiologist and on diuretics.  He will need to follow-up with rheumatologist as outpatient.   #Acute on chronic diastolic dysfunction: Initially treated with IV diuretics currently on p.o. Lasix and close to euvolemic.   #Hypertension: Losartan on hold because of AKI.  Blood pressure seems to be in acceptable range with amlodipine and Lasix.  Subjective: Seen and examined.  Chart reviewed.  Patient reports feeling well without any complaint.  Denies nausea, vomiting, chest pain, shortness of breath.  No cough.  Waiting to hear from IR for the kidney biopsy date and time. Objective Vital signs in last 24 hours: Vitals:   11/27/20 1928 11/28/20 0300 11/28/20 0300 11/28/20 0304  BP: (!) 155/89 (!) 156/95 (!) 156/95   Pulse: 97 96 96   Resp:  16 16 16    Temp: 99.3 F (37.4 C) 97.9 F (36.6 C) 97.9 F (36.6 C)   TempSrc: Oral Oral Oral   SpO2: 100%  99%   Weight:    90.4 kg  Height:       Weight change: 0.726 kg  Intake/Output Summary (Last 24 hours) at 11/28/2020 0952 Last data filed at 11/28/2020 0800 Gross per 24 hour  Intake 480 ml  Output 650 ml  Net -170 ml       Labs: Basic Metabolic Panel: Recent Labs  Lab 11/26/20 0252 11/26/20 1221 11/27/20 0232 11/28/20 0424  NA 133* 135 136 135  K 3.6 3.7 4.0 3.7  CL 102 103 107 107  CO2 23 24 22 22   GLUCOSE 127* 93 89 95  BUN 36* 33* 30* 23*  CREATININE 1.49* 1.33* 1.34* 1.19  CALCIUM 7.4* 7.5* 7.7* 7.7*  PHOS 3.1  --   --  2.9   Liver Function Tests: Recent Labs  Lab 11/23/20 1639 11/26/20 1221 11/28/20 0424  AST 25 24  --   ALT 21 19  --   ALKPHOS 68 61  --   BILITOT 0.5 0.4  --   PROT 5.1* 4.6*  --   ALBUMIN 1.7* 1.5* 1.4*   No results for input(s): LIPASE, AMYLASE in the last 168 hours. No results for input(s): AMMONIA in the last 168 hours. CBC: Recent Labs  Lab 11/23/20 1639 11/24/20 0237 11/26/20 0252 11/26/20 1928 11/27/20 0232 11/27/20 1450 11/28/20 0424  WBC 12.8* 11.2* 7.4  --  7.9 8.4 7.1  NEUTROABS 11.0*  --   --   --   --  6.4  --  HGB 7.9* 8.0* 7.4*   < > 9.5* 10.0* 8.7*  HCT 24.9* 24.6* 22.8*   < > 28.5* 30.1* 26.3*  MCV 86.2 84.2 83.8  --  83.1 84.1 84.3  PLT 272 239 301  --  366 360 344   < > = values in this interval not displayed.   Cardiac Enzymes: No results for input(s): CKTOTAL, CKMB, CKMBINDEX, TROPONINI in the last 168 hours. CBG: No results for input(s): GLUCAP in the last 168 hours.  Iron Studies: No results for input(s): IRON, TIBC, TRANSFERRIN, FERRITIN in the last 72 hours. Studies/Results: DG Chest 1 View  Result Date: 11/26/2020 CLINICAL DATA:  Status post left thoracentesis. EXAM: CHEST  1 VIEW COMPARISON:  Chest x-ray from yesterday. FINDINGS: Stable cardiomegaly. Residual trace bilateral  pleural effusions with mild bibasilar atelectasis. No pneumothorax. No acute osseous abnormality. IMPRESSION: 1. Residual trace bilateral pleural effusions and bibasilar atelectasis. No pneumothorax. Electronically Signed   By: Obie Dredge M.D.   On: 11/26/2020 12:37   IR THORACENTESIS ASP PLEURAL SPACE W/IMG GUIDE  Result Date: 11/26/2020 Alene Mires, NP     11/26/2020  1:22 PM Ultrasound-guided therapeutic left sided thoracentesis performed yielding 900 mililiters of straw colored fluid. No immediate complications.  Follow-up chest x-ray pending. EBL is < 2 ml.       Medications: Infusions:  sodium chloride     heparin 2,450 Units/hr (11/28/20 0550)    Scheduled Medications:  amLODipine  5 mg Oral Daily   furosemide  40 mg Oral Daily   hydroxychloroquine  200 mg Oral Daily   sodium chloride flush  3 mL Intravenous Q12H    have reviewed scheduled and prn medications.  Physical Exam: General:NAD, comfortable Heart:RRR, s1s2 nl Lungs:clear b/l, no crackle Abdomen:soft, Non-tender, non-distended Extremities: Trace LE edema Neurology: Alert awake, nonfocal  Gerald Lloyd 11/28/2020,9:52 AM  LOS: 5 days

## 2020-11-28 NOTE — Progress Notes (Signed)
ANTICOAGULATION CONSULT NOTE - Follow Up Consult  Pharmacy Consult for heparin Indication: LLE popliteal DVT 05/26/20  No Known Allergies  Patient Measurements: Height: 6' (182.9 cm) Weight: 90.4 kg (199 lb 4.8 oz) IBW/kg (Calculated) : 77.6 Heparin Dosing Weight: 92kg  Vital Signs: Temp: 97.9 F (36.6 C) (08/21 1154) Temp Source: Oral (08/21 1154) BP: 156/98 (08/21 1154) Pulse Rate: 93 (08/21 1154)  Labs: Recent Labs    11/26/20 0252 11/26/20 1221 11/26/20 1843 11/26/20 1958 11/27/20 0232 11/27/20 1450 11/28/20 0424 11/28/20 1251  HGB 7.4*  --    < >  --  9.5* 10.0* 8.7*  --   HCT 22.8*  --    < >  --  28.5* 30.1* 26.3*  --   PLT 301  --   --   --  366 360 344  --   APTT 49*  --   --  52* 68*  --   --   --   HEPARINUNFRC 0.37  --    < > 0.29* 0.36  --  0.28* 0.47  CREATININE 1.49* 1.33*  --   --  1.34*  --  1.19  --    < > = values in this interval not displayed.    Estimated Creatinine Clearance: 75.2 mL/min (by C-G formula based on SCr of 1.19 mg/dL).   Assessment: 59 yo M with hx LLE popliteal DVT 05/26/20 on apixaban PTA. Apixaban held for procedure, last dose 8/17 @ 3:30am.  Pharmacy consulted for heparin. Heparin infusion started at 1400 units/hr and increased to 2450 units/hr due to continuously low heparin levels.  Most recent heparin level therapeutic at 0.47. CBC stable.  Goal of Therapy:  Heparin level 0.3-0.7 units/ml Monitor platelets by anticoagulation protocol: Yes   Plan:  Continue heparin infusion at 2450 units/hr No levels ordered d/t heparin off 8/21 @2200  Monitor for s/sx of bleeding  Thank you for including pharmacy in the care of this patient.  , PharmD PGY1 Acute Care Pharmacy Resident  Phone: [phone] 11/28/2020  3:36 PM  Please check [email or web address] for unit-specific pharmacy phone numbers.

## 2020-11-29 ENCOUNTER — Inpatient Hospital Stay (HOSPITAL_COMMUNITY): Payer: Commercial Managed Care - PPO

## 2020-11-29 LAB — PROTEIN ELECTROPHORESIS, SERUM
A/G Ratio: 0.7 (ref 0.7–1.7)
Albumin ELP: 1.7 g/dL — ABNORMAL LOW (ref 2.9–4.4)
Alpha-1-Globulin: 0.4 g/dL (ref 0.0–0.4)
Alpha-2-Globulin: 1 g/dL (ref 0.4–1.0)
Beta Globulin: 0.5 g/dL — ABNORMAL LOW (ref 0.7–1.3)
Gamma Globulin: 0.6 g/dL (ref 0.4–1.8)
Globulin, Total: 2.5 g/dL (ref 2.2–3.9)
Total Protein ELP: 4.2 g/dL — ABNORMAL LOW (ref 6.0–8.5)

## 2020-11-29 LAB — CBC
HCT: 26.7 % — ABNORMAL LOW (ref 39.0–52.0)
Hemoglobin: 8.9 g/dL — ABNORMAL LOW (ref 13.0–17.0)
MCH: 28 pg (ref 26.0–34.0)
MCHC: 33.3 g/dL (ref 30.0–36.0)
MCV: 84 fL (ref 80.0–100.0)
Platelets: 359 10*3/uL (ref 150–400)
RBC: 3.18 MIL/uL — ABNORMAL LOW (ref 4.22–5.81)
RDW: 14.6 % (ref 11.5–15.5)
WBC: 7.5 10*3/uL (ref 4.0–10.5)
nRBC: 0 % (ref 0.0–0.2)

## 2020-11-29 LAB — HEPARIN LEVEL (UNFRACTIONATED): Heparin Unfractionated: 0.32 IU/mL (ref 0.30–0.70)

## 2020-11-29 LAB — RENAL FUNCTION PANEL
Albumin: 1.4 g/dL — ABNORMAL LOW (ref 3.5–5.0)
Anion gap: 6 (ref 5–15)
BUN: 23 mg/dL — ABNORMAL HIGH (ref 6–20)
CO2: 23 mmol/L (ref 22–32)
Calcium: 7.7 mg/dL — ABNORMAL LOW (ref 8.9–10.3)
Chloride: 107 mmol/L (ref 98–111)
Creatinine, Ser: 1.27 mg/dL — ABNORMAL HIGH (ref 0.61–1.24)
GFR, Estimated: >60 mL/min (ref >60)
Glucose, Bld: 98 mg/dL (ref 70–99)
Phosphorus: 3.3 mg/dL (ref 2.5–4.6)
Potassium: 3.9 mmol/L (ref 3.5–5.1)
Sodium: 136 mmol/L (ref 135–145)

## 2020-11-29 LAB — ANCA TITERS
Atypical P-ANCA titer: <1:20 {titer}
C-ANCA: 1:160 {titer} — ABNORMAL HIGH
P-ANCA: <1:20 {titer}

## 2020-11-29 LAB — PROTIME-INR
INR: 1 (ref 0.8–1.2)
Prothrombin Time: 13.6 seconds (ref 11.4–15.2)

## 2020-11-29 MED ORDER — HYDRALAZINE HCL 20 MG/ML IJ SOLN
INTRAMUSCULAR | Status: AC | PRN
  Administered 2020-11-29: 10 mg via INTRAVENOUS

## 2020-11-29 MED ORDER — HYDRALAZINE HCL 20 MG/ML IJ SOLN
INTRAMUSCULAR | Status: AC
  Filled 2020-11-29: qty 1

## 2020-11-29 MED ORDER — FENTANYL CITRATE (PF) 100 MCG/2ML IJ SOLN
INTRAMUSCULAR | Status: AC
  Filled 2020-11-29: qty 2

## 2020-11-29 MED ORDER — LIDOCAINE HCL (PF) 1 % IJ SOLN
INTRAMUSCULAR | Status: AC
  Filled 2020-11-29: qty 30

## 2020-11-29 MED ORDER — HEPARIN BOLUS VIA INFUSION
3000.0000 [IU] | Freq: Once | INTRAVENOUS | Status: AC
  Administered 2020-11-29: 3000 [IU] via INTRAVENOUS
  Filled 2020-11-29: qty 3000

## 2020-11-29 MED ORDER — MIDAZOLAM HCL 2 MG/2ML IJ SOLN
INTRAMUSCULAR | Status: AC
  Filled 2020-11-29: qty 2

## 2020-11-29 MED ORDER — AMLODIPINE BESYLATE 10 MG PO TABS
10.0000 mg | ORAL_TABLET | Freq: Every day | ORAL | Status: DC
  Administered 2020-11-30: 10 mg via ORAL
  Filled 2020-11-29: qty 1

## 2020-11-29 MED ORDER — HEPARIN (PORCINE) 25000 UT/250ML-% IV SOLN
2450.0000 [IU]/h | INTRAVENOUS | Status: DC
  Administered 2020-11-29 (×2): 2450 [IU]/h via INTRAVENOUS
  Filled 2020-11-29 (×2): qty 250

## 2020-11-29 MED ORDER — GELATIN ABSORBABLE 12-7 MM EX MISC
CUTANEOUS | Status: AC
  Filled 2020-11-29: qty 1

## 2020-11-29 NOTE — Progress Notes (Signed)
ANTICOAGULATION CONSULT NOTE - Follow Up Consult  Pharmacy Consult for heparin Indication: LLE popliteal DVT 05/26/20  No Known Allergies  Patient Measurements: Height: 6' (182.9 cm) Weight: 90 kg (198 lb 6.4 oz) IBW/kg (Calculated) : 77.6 Heparin Dosing Weight: 90kg  Vital Signs: Temp: 97.7 F (36.5 C) (08/22 1121) Temp Source: Oral (08/22 1121) BP: 150/87 (08/22 1121) Pulse Rate: 99 (08/22 1121)  Labs: Recent Labs    11/26/20 1958 11/26/20 1958 11/27/20 0232 11/27/20 1450 11/28/20 0424 11/28/20 1251 11/29/20 0432  HGB  --   --  9.5* 10.0* 8.7*  --  8.9*  HCT  --   --  28.5* 30.1* 26.3*  --  26.7*  PLT  --    < > 366 360 344  --  359  APTT 52*  --  68*  --   --   --   --   LABPROT  --   --   --   --   --   --  13.6  INR  --   --   --   --   --   --  1.0  HEPARINUNFRC 0.29*  --  0.36  --  0.28* 0.47  --   CREATININE  --   --  1.34*  --  1.19  --  1.27*   < > = values in this interval not displayed.     Estimated Creatinine Clearance: 70.4 mL/min (A) (by C-G formula based on SCr of 1.27 mg/dL (H)).   Assessment: 60 yo M with hx LLE popliteal DVT 05/26/20 on apixaban PTA. Apixaban held for procedure, last dose 8/17 @ 3:30am.  Pharmacy consulted 11/24/20 for heparin.  Heparin drip was stopped on 11/28/20 ~ 22:00 per IR in prep for renal biopsy 11/29/20 today.  However biopsy postponed due to outer guide needle for biopsy is out of stock.   I confirmed with IR Dr. Fredia Sorrow, that biopsy postponed until tomorrow at earliest.  He okay's restart of heparin drip and IR MD/PA will order to stop heparin when renal biopsy time /date known, possible Tues. 8/23.   Most recent heparin level therapeutic at 0.47 on 2450 units/hr. CBC stable.  Goal of Therapy:  Heparin level 0.3-0.7 units/ml Monitor platelets by anticoagulation protocol: Yes   Plan:  Give 3000 units IV heparin bolus  Restart heparin infusion at 2450 units/hr Check HL in 6-8 hours CBC and HL daily qAM Monitor for  s/sx of bleeding  Thank you for including pharmacy in the care of this patient. Phone: [phone] 11/29/2020  1:46 PM  Please check [email or web address] for unit-specific pharmacy phone numbers.

## 2020-11-29 NOTE — Progress Notes (Signed)
Progress Note    Gerald Lloyd  RJJ:[id number] DOB: 11-20-1960  DOA: 11/23/2020 PCP: [facility]      Brief Narrative:    Medical records reviewed and are as summarized below:  Gerald Lloyd is a 60 y.o. 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  He complained of progressive shortness of breath and bilateral leg swelling.  He went to [facility] where CT chest and 2D echo reviewed moderate pericardial effusion.  He was referred to [facility] for further management.  He was admitted to the hospital for acute on chronic diastolic CHF.  He was also found to have large pericardial effusion without evidence of cardiac tamponade on 2D echo. EF was 55 to 60% but there was evidence of grade 2 diastolic dysfunction.  He was treated with IV Lasix.  Cardiologist was consulted to assist with management.  Eliquis was held and he was switched to IV heparin infusion while inpatient.  He had bilateral pleural effusion and he underwent bilateral thoracentesis with removal of 900 mls of exudative fluid on the right and 900 mls of fluid on the left.  Additional work-up revealed findings consistent with systemic lupus erythematosus and lupus nephritis.  He was started on hydroxychloroquine.  Nephrologist was consulted to assist with management.    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   Acute on chronic diastolic CHF: Continue Lasix.  He has good urine output. Large  pericardial effusion: No evidence of cardiac tamponade.  Monitor closely.  Cardiologist has signed off.  Outpatient follow-up with cardiologist recommended.  Systemic lupus erythematosus: Positive ANA, positive dsDNA antibody, positive anti-Smith antibody, low C3 and C4 complement levels.  These positive serologic markers together with constellation of clinical features are suggestive of SLE.   Continue hydroxychloroquine.  Outpatient follow-up with rheumatologist recommended.  Suspected lupus nephritis, AKI, hematuria, proteinuria, hypoalbuminemia: Kidney biopsy could not be done today because reportedly, one of the required equipment for the procedure was out of stock..  Kidney biopsy has been rescheduled for tomorrow.  Follow-up with nephrologist and IR.  History of left lower extremity DVT (February 2022): Continue IV heparin infusion with plan to discontinue prior to kidney biopsy.  Bilateral pleural effusion: S/p right-sided thoracentesis on 11/25/2020 with removal of 900 mls of amber-colored exudative fluid.  S/p left-sided thoracentesis with removal of 900 mLs of fluid on 11/26/2020.    Hypomagnesemia: Improved  Iron deficiency anemia, anemia of chronic disease: H&H is stable.  S/p transfusion requirements of PRBCs on 11/26/2020.  Serum iron 15, iron saturation 9 and ferritin 1,419.  Mildly elevated troponins: Likely from demand ischemia.  Hypertension: Losartan has been held.  Increase amlodipine from 5 mg to 10 mg daily   Diet Order             Diet NPO time specified Except for: Sips with Meds  Diet effective midnight           Diet Heart Room service appropriate? Yes; Fluid consistency: Thin  Diet effective now                      Consultants: Cardiologist Nephrologist  Procedures: Right-sided thoracentesis on 11/25/2020 Left-sided thoracentesis on 11/26/2020    Medications:    amLODipine  5 mg Oral Daily   furosemide  40 mg Oral Daily   gelatin adsorbable        hydrALAZINE       hydroxychloroquine  200 mg Oral Daily   lidocaine (PF)       lidocaine (PF)       sodium chloride flush  3 mL Intravenous Q12H   Continuous Infusions:  sodium chloride     heparin 2,450 Units/hr (11/29/20 1449)     Anti-infectives (From admission, onward)    Start     Dose/Rate Route Frequency Ordered Stop   11/27/20 1500  hydroxychloroquine (PLAQUENIL) tablet 200 mg        200 mg Oral Daily 11/27/20 1031                Family Communication/Anticipated D/C date and plan/Code Status   DVT prophylaxis:      Code Status: Full Code  Family Communication: None Disposition Plan:    Status is: Inpatient  Remains inpatient appropriate because:Inpatient level of care appropriate due to severity of illness  Dispo: The patient is from: Home              Anticipated d/c is to: Home              Patient currently is not medically stable to d/c.   Difficult to place patient No           Subjective:   Interval events noted.  He is frustrated because he could not have the kidney biopsy today.  Objective:    Vitals:   11/29/20 0800 11/29/20 0840 11/29/20 0936 11/29/20 1121  BP: (!) 169/99 (!) 158/91  (!) 150/87  Pulse: 98 (!) 101  99  Resp: 14 (!) 22 18 18   Temp:    97.7 F (36.5 C)  TempSrc:    Oral  SpO2: 99% 99%  96%  Weight:      Height:       No data found.   Intake/Output Summary (Last 24 hours) at 11/29/2020 1639 Last data filed at 11/29/2020 0900 Gross per 24 hour  Intake 2262.43 ml  Output 450 ml  Net 1812.43 ml   Filed Weights   11/27/20 0527 11/28/20 0304 11/29/20 0555  Weight: 89.7 kg 90.4 kg 90 kg    Exam:  GEN: NAD SKIN: No rash EYES: EOMI ENT: MMM CV: RRR PULM: CTA B ABD: soft, ND, NT, +BS CNS: AAO x 3, non focal EXT: B/l leg edema, no tenderness             Data Reviewed:   I have personally reviewed following labs and imaging studies:  Labs: Labs show the following:   Basic  Metabolic Panel: Recent Labs  Lab 11/25/20 0909 11/26/20 0252 11/26/20 1221 11/27/20 0232 11/28/20 0424 11/29/20 0432  NA 136 133* 135 136 135 136  K 3.6 3.6 3.7 4.0 3.7 3.9  CL 104 102 103 107 107 107  CO2 23 23 24 22 22 23   GLUCOSE 108* 127* 93 89 95 98  BUN 35* 36* 33* 30* 23* 23*  CREATININE 1.54* 1.49* 1.33* 1.34* 1.19 1.27*  CALCIUM 7.6* 7.4* 7.5* 7.7* 7.7* 7.7*  MG 1.6* 1.8  --  1.8  --   --   PHOS  --  3.1  --   --  2.9 3.3   GFR Estimated Creatinine Clearance: 70.4 mL/min (A) (by C-G formula based on SCr of 1.27 mg/dL (H)). Liver Function Tests: Recent Labs  Lab 11/23/20 1639 11/26/20 1221 11/28/20 0424 11/29/20 0432  AST 25 24  --   --   ALT 21 19  --   --   ALKPHOS 68 61  --   --   BILITOT 0.5 0.4  --   --   PROT 5.1* 4.6*  --   --   ALBUMIN 1.7* 1.5* 1.4* 1.4*   No results for input(s): LIPASE, AMYLASE in the last 168 hours. No results for input(s): AMMONIA in the last 168 hours. Coagulation profile Recent Labs  Lab 11/29/20 0432  INR 1.0    CBC: Recent Labs  Lab 11/23/20 1639 11/24/20 0237 11/26/20 0252 11/26/20 1928 11/27/20 0232 11/27/20 1450 11/28/20 0424 11/29/20 0432  WBC 12.8*   < > 7.4  --  7.9 8.4 7.1 7.5  NEUTROABS 11.0*  --   --   --   --  6.4  --   --   HGB 7.9*   < > 7.4* 10.7* 9.5* 10.0* 8.7* 8.9*  HCT 24.9*   < > 22.8* 31.9* 28.5* 30.1* 26.3* 26.7*  MCV 86.2   < > 83.8  --  83.1 84.1 84.3 84.0  PLT 272   < > 301  --  366 360 344 359   < > = values in this interval not displayed.   Cardiac Enzymes: No results for input(s): CKTOTAL, CKMB, CKMBINDEX, TROPONINI in the last 168 hours. BNP (last 3 results) No results for input(s): PROBNP in the last 8760 hours. CBG: No results for input(s): GLUCAP in the last 168 hours. D-Dimer: No results for input(s): DDIMER in the last 72 hours. Hgb A1c: No results for input(s): HGBA1C in the last 72 hours. Lipid Profile: No results for input(s): CHOL, HDL, LDLCALC, TRIG, CHOLHDL,  LDLDIRECT in the last 72 hours.  Thyroid function studies: No results for input(s): TSH, T4TOTAL, T3FREE, THYROIDAB in the last 72 hours.  Invalid input(s): FREET3 Anemia work up: No results for input(s): VITAMINB12, FOLATE, FERRITIN, TIBC, IRON, RETICCTPCT in the last 72 hours.  Sepsis Labs: Recent Labs  Lab 11/27/20 0232 11/27/20 1450 11/28/20 0424 11/29/20 0432  WBC 7.9 8.4 7.1 7.5    Microbiology Recent Results (from the past 240 hour(s))  Resp Panel by RT-PCR (Flu A&B, Covid) Nasopharyngeal Swab     Status: None   Collection Time: 11/23/20 11:01 PM   Specimen: Nasopharyngeal Swab; Nasopharyngeal(NP) swabs in vial transport medium  Result Value Ref Range Status   SARS Coronavirus 2 by RT PCR NEGATIVE NEGATIVE Final    Comment: (NOTE) SARS-CoV-2 target nucleic acids are NOT DETECTED.  The SARS-CoV-2 RNA is generally detectable in upper respiratory specimens during the acute phase of infection. The lowest concentration of SARS-CoV-2 viral copies this assay can detect is 138 copies/mL. A negative result does not preclude SARS-Cov-2 infection and should not be used as the sole basis for treatment or other patient management decisions. A negative result may occur with  improper specimen collection/handling, submission of specimen other than nasopharyngeal swab, presence of viral mutation(s) within the areas targeted by this assay, and inadequate number of viral copies(<138 copies/mL). A negative result must be combined with clinical observations, patient history, and epidemiological information. The expected result is Negative.  Fact Sheet for Patients:  BloggerCourse.com  Fact Sheet for Healthcare Providers:  SeriousBroker.it  This test is no t yet approved or cleared by the [place] FDA and  has been authorized for detection and/or diagnosis of SARS-CoV-2 by FDA under an Emergency Use Authorization (EUA). This  EUA will remain  in effect (meaning this test can be used) for the duration of the COVID-19 declaration under Section 564(b)(1) of the Act, 21 U.S.C.section 360bbb-3(b)(1), unless the authorization is terminated  or revoked sooner.       Influenza A by PCR NEGATIVE NEGATIVE Final   Influenza B by PCR NEGATIVE NEGATIVE Final    Comment: (NOTE) The Xpert Xpress SARS-CoV-2/FLU/RSV plus assay is intended as an aid in the diagnosis of influenza from Nasopharyngeal swab specimens and should not be used as a sole basis for treatment. Nasal washings and aspirates are unacceptable for Xpert Xpress SARS-CoV-2/FLU/RSV testing.  Fact Sheet for Patients: BloggerCourse.com  Fact Sheet for Healthcare Providers: SeriousBroker.it  This test is not yet approved or cleared by the [place] FDA and has been authorized for detection and/or diagnosis of SARS-CoV-2 by FDA under an Emergency Use Authorization (EUA). This EUA will remain in effect (meaning this test can be used) for the duration of the COVID-19 declaration under Section 564(b)(1) of the Act, 21 U.S.C. section 360bbb-3(b)(1), unless the authorization is terminated or revoked.  Performed at [facility], [address], [place], [place] [address]   Culture, body fluid w Gram Stain-bottle     Status: None (Preliminary result)   Collection Time: 11/25/20 12:59 PM   Specimen: Fluid  Result Value Ref Range Status   Specimen Description FLUID RIGHT PLEURAL  Final   Special Requests BOTTLES DRAWN AEROBIC AND ANAEROBIC  Final   Culture   Final    NO GROWTH 4 DAYS Performed at [facility], [address], [place], [place] [address]    Report Status PENDING  Incomplete  Gram stain     Status: None   Collection Time: 11/25/20 12:59 PM   Specimen: Fluid  Result Value Ref Range Status   Specimen Description FLUID RIGHT PLEURAL  Final   Special Requests NONE  Final   Gram  Stain   Final    MODERATE WBC PRESENT,BOTH PMN AND MONONUCLEAR NO ORGANISMS SEEN Performed at [facility], [address], [place], [place] [address]    Report Status 11/26/2020 FINAL  Final    Procedures and diagnostic studies:  No results found.             LOS: 6 days   Sarahjane Matherly  [facility]   Pager on [email or web address]. If 7PM-7AM, please contact night-coverage at [email or web address]     11/29/2020, 4:39 PM

## 2020-11-29 NOTE — Progress Notes (Signed)
ANTICOAGULATION CONSULT NOTE - Follow Up Consult  Pharmacy Consult for heparin Indication: LLE popliteal DVT 05/26/20  No Known Allergies  Patient Measurements: Height: 6' (182.9 cm) Weight: 90 kg (198 lb 6.4 oz) IBW/kg (Calculated) : 77.6 Heparin Dosing Weight: 90kg  Vital Signs: Temp: 98.1 F (36.7 C) (08/22 1954) Temp Source: Oral (08/22 1954) BP: 143/93 (08/22 1954) Pulse Rate: 99 (08/22 1954)  Labs: Recent Labs    11/27/20 0232 11/27/20 1450 11/28/20 0424 11/28/20 1251 11/29/20 0432 11/29/20 2130  HGB 9.5* 10.0* 8.7*  --  8.9*  --   HCT 28.5* 30.1* 26.3*  --  26.7*  --   PLT 366 360 344  --  359  --   APTT 68*  --   --   --   --   --   LABPROT  --   --   --   --  13.6  --   INR  --   --   --   --  1.0  --   HEPARINUNFRC 0.36  --  0.28* 0.47  --  0.32  CREATININE 1.34*  --  1.19  --  1.27*  --      Estimated Creatinine Clearance: 70.4 mL/min (A) (by C-G formula based on SCr of 1.27 mg/dL (H)).   Assessment: 60 yo M with hx LLE popliteal DVT 05/26/20 on apixaban PTA. Apixaban held for procedure, last dose 8/17 @ 3:30am.  Pharmacy consulted 11/24/20 for heparin.  Heparin level therapeutic at 0.32 on 2450 units/hr. CBC stable.  Goal of Therapy:  Heparin level 0.3-0.7 units/ml Monitor platelets by anticoagulation protocol: Yes   Plan:  Continue heparin infusion at 2450 units/hr CBC and HL daily qAM Monitor for s/sx of bleeding  Thank you for including pharmacy in the care of this patient.  Sheppard Coil PharmD., BCPS Clinical Pharmacist 11/29/2020 10:31 PM

## 2020-11-29 NOTE — Sedation Documentation (Signed)
Unable to proceed with procedure at this time per Dr. Fredia Sorrow. Procedure aborted

## 2020-11-29 NOTE — Progress Notes (Signed)
[facility] PROGRESS NOTE  Assessment/ Plan: Pt is a 60 y.o. yo male  with history of hypertension, left lower extremity DVT on Eliquis, who was admitted with shortness of breath and pericardial effusion, seen as a consultation for the evaluation of AKI and proteinuria.  #Acute kidney injury with sub-nephrotic range proteinuria/microscopic hematuria presumably due to lupus nephritis.  Patient has positive ANA, elevated double-stranded DNA with low complements.  The finding of pericardial effusion and pleural effusion probably part of autoimmune disease.  GFR is fairly preserved.   Tentative for biopsy today if IR is able After biopsy, if stable, could discharge home and we can further manage this as an outpatient Further immunosuppression based upon results of biopsy Given nonrenal manifestations of lupus, will also need rheumatologist ANCA titers pending  #Pericardial effusion/pleural effusion: Status post b/l thoracocentesis and clinically feeling better.  This is part of autoimmune disease/lupus.  Seen by cardiologist and on diuretics.  He will need to follow-up with rheumatologist as outpatient.   #Acute on chronic diastolic dysfunction: Initially treated with IV diuretics currently on p.o. Lasix and close to euvolemic.   #Hypertension: Losartan on hold because of AKI.  Blood pressure seems to be in acceptable range with amlodipine and Lasix.  Subjective:  Biopsy potentially delayed secondary to missing kit equipment Stable creatinine 1.27, UPC was 1.7.  Reviewed clinical course with patient Only on hydroxychloroquine at the current time Profoundly hypoalbuminemic Trace edema Denies significant arthralgias On Lasix 40 p.o. twice daily  Objective Vital signs in last 24 hours: Vitals:   11/29/20 0800 11/29/20 0840 11/29/20 0936 11/29/20 1121  BP: (!) 169/99 (!) 158/91  (!) 150/87  Pulse: 98 (!) 101  99  Resp: 14 (!) '22 18 18  ' Temp:    97.7 F (36.5 C)   TempSrc:    Oral  SpO2: 99% 99%  96%  Weight:      Height:       Weight change: -0.408 kg  Intake/Output Summary (Last 24 hours) at 11/29/2020 1134 Last data filed at 11/29/2020 0502 Gross per 24 hour  Intake 2262.43 ml  Output 450 ml  Net 1812.43 ml        Labs: Basic Metabolic Panel: Recent Labs  Lab 11/26/20 0252 11/26/20 1221 11/27/20 0232 11/28/20 0424 11/29/20 0432  NA 133*   < > 136 135 136  K 3.6   < > 4.0 3.7 3.9  CL 102   < > 107 107 107  CO2 23   < > '22 22 23  ' GLUCOSE 127*   < > 89 95 98  BUN 36*   < > 30* 23* 23*  CREATININE 1.49*   < > 1.34* 1.19 1.27*  CALCIUM 7.4*   < > 7.7* 7.7* 7.7*  PHOS 3.1  --   --  2.9 3.3   < > = values in this interval not displayed.    Liver Function Tests: Recent Labs  Lab 11/23/20 1639 11/26/20 1221 11/28/20 0424 11/29/20 0432  AST 25 24  --   --   ALT 21 19  --   --   ALKPHOS 68 61  --   --   BILITOT 0.5 0.4  --   --   PROT 5.1* 4.6*  --   --   ALBUMIN 1.7* 1.5* 1.4* 1.4*    No results for input(s): LIPASE, AMYLASE in the last 168 hours. No results for input(s): AMMONIA in the last 168 hours. CBC: Recent Labs  Lab 11/23/20 1639 11/24/20 0237 11/26/20 0252 11/26/20 1928 11/27/20 0232 11/27/20 1450 11/28/20 0424 11/29/20 0432  WBC 12.8*   < > 7.4  --  7.9 8.4 7.1 7.5  NEUTROABS 11.0*  --   --   --   --  6.4  --   --   HGB 7.9*   < > 7.4*   < > 9.5* 10.0* 8.7* 8.9*  HCT 24.9*   < > 22.8*   < > 28.5* 30.1* 26.3* 26.7*  MCV 86.2   < > 83.8  --  83.1 84.1 84.3 84.0  PLT 272   < > 301  --  366 360 344 359   < > = values in this interval not displayed.    Cardiac Enzymes: No results for input(s): CKTOTAL, CKMB, CKMBINDEX, TROPONINI in the last 168 hours. CBG: No results for input(s): GLUCAP in the last 168 hours.  Iron Studies: No results for input(s): IRON, TIBC, TRANSFERRIN, FERRITIN in the last 72 hours. Studies/Results: No results found.  Medications: Infusions:  sodium chloride       Scheduled Medications:  amLODipine  5 mg Oral Daily   furosemide  40 mg Oral Daily   hydrALAZINE       hydroxychloroquine  200 mg Oral Daily   lidocaine (PF)       sodium chloride flush  3 mL Intravenous Q12H    have reviewed scheduled and prn medications.  Physical Exam: General:NAD, comfortable Heart:RRR, s1s2 nl Lungs:clear b/l, no crackle Abdomen:soft, Non-tender, non-distended Extremities: Trace LE edema Neurology: Alert awake, nonfocal  Margues Filippini B Shamica Moree 11/29/2020,11:34 AM  LOS: 6 days

## 2020-11-29 NOTE — Progress Notes (Signed)
Heart Failure Navigator Progress Note  Assessed for Heart & Vascular TOC clinic readiness.  Patient does not meet criteria due to workup for SLE, cardiology has signed off.   Navigator available for reassessment of patient.   Ozella Rocks, MSN, RN Heart Failure Nurse Navigator [phone]

## 2020-11-29 NOTE — Progress Notes (Signed)
Interventional Radiology Progress Note  Outer guide needle for coaxial core biopsy set is out of stock at both [facility] and [facility] campuses. Awaiting delivery of product, possibly today or tomorrow. Biopsy therefore will be delayed.  Patient will be allowed clear liquids for next hour due to possibility of performing biopsy this afternoon. If product does not arrive by early afternoon, will resume diet until MN and reschedule renal biopsy for possibly tomorrow.  Jodi Marble. Fredia Sorrow, M.D Pager:  [phone]

## 2020-11-29 NOTE — Progress Notes (Signed)
IR was requested for random renal bx.   The procedure was tentatively scheduled for today, however, IR was not able to accommodate the procedure today due outer guide needle for coaxial core biopsy set out of stock at both [facility] and [facility] campuses.   RN/MD notified via secure chat, made npo at midnight and placed a nursing order to hold heparin infusion at 2 am.   The procedure is tentatively scheduled for tomorrow pending IR schedule.  Please call IR for questions and concerns.    Lynann Bologna Jaevin Medearis PA-C 11/29/2020 3:07 PM

## 2020-11-30 ENCOUNTER — Inpatient Hospital Stay (HOSPITAL_COMMUNITY): Payer: Commercial Managed Care - PPO

## 2020-11-30 LAB — HEPARIN LEVEL (UNFRACTIONATED): Heparin Unfractionated: <0.1 IU/mL — ABNORMAL LOW (ref 0.30–0.70)

## 2020-11-30 LAB — CBC
HCT: 26.6 % — ABNORMAL LOW (ref 39.0–52.0)
Hemoglobin: 8.9 g/dL — ABNORMAL LOW (ref 13.0–17.0)
MCH: 28.2 pg (ref 26.0–34.0)
MCHC: 33.5 g/dL (ref 30.0–36.0)
MCV: 84.2 fL (ref 80.0–100.0)
Platelets: 363 10*3/uL (ref 150–400)
RBC: 3.16 MIL/uL — ABNORMAL LOW (ref 4.22–5.81)
RDW: 14.7 % (ref 11.5–15.5)
WBC: 9.3 10*3/uL (ref 4.0–10.5)
nRBC: 0 % (ref 0.0–0.2)

## 2020-11-30 LAB — CULTURE, BODY FLUID W GRAM STAIN -BOTTLE: Culture: NO GROWTH

## 2020-11-30 MED ORDER — MIDAZOLAM HCL 2 MG/2ML IJ SOLN
INTRAMUSCULAR | Status: AC
  Filled 2020-11-30: qty 2

## 2020-11-30 MED ORDER — LIDOCAINE HCL (PF) 1 % IJ SOLN
INTRAMUSCULAR | Status: AC
  Filled 2020-11-30: qty 30

## 2020-11-30 MED ORDER — FENTANYL CITRATE (PF) 100 MCG/2ML IJ SOLN
INTRAMUSCULAR | Status: AC
  Filled 2020-11-30: qty 2

## 2020-11-30 MED ORDER — HYDRALAZINE HCL 20 MG/ML IJ SOLN
INTRAMUSCULAR | Status: AC
  Filled 2020-11-30: qty 1

## 2020-11-30 MED ORDER — AMLODIPINE BESYLATE 10 MG PO TABS: 10.0000 mg | ORAL_TABLET | Freq: Every day | ORAL | 0 refills | Status: DC

## 2020-11-30 MED ORDER — HEPARIN (PORCINE) 25000 UT/250ML-% IV SOLN: 2450.0000 [IU]/h | INTRAVENOUS | Status: DC

## 2020-11-30 MED ORDER — ELIQUIS 5 MG PO TABS: 5.0000 mg | ORAL_TABLET | Freq: Two times a day (BID) | ORAL | Status: AC

## 2020-11-30 MED ORDER — GELATIN ABSORBABLE 12-7 MM EX MISC
CUTANEOUS | Status: AC
  Filled 2020-11-30: qty 1

## 2020-11-30 MED ORDER — FENTANYL CITRATE (PF) 100 MCG/2ML IJ SOLN
INTRAMUSCULAR | Status: AC | PRN
  Administered 2020-11-30 (×4): 25 ug via INTRAVENOUS

## 2020-11-30 MED ORDER — HYDROXYCHLOROQUINE SULFATE 200 MG PO TABS: 200.0000 mg | ORAL_TABLET | Freq: Every day | ORAL | 0 refills | Status: DC

## 2020-11-30 MED ORDER — MIDAZOLAM HCL 2 MG/2ML IJ SOLN
INTRAMUSCULAR | Status: AC | PRN
Start: 2020-11-30 — End: 2020-11-30
  Administered 2020-11-30 (×2): 0.5 mg via INTRAVENOUS
  Administered 2020-11-30: 1 mg via INTRAVENOUS
  Administered 2020-11-30: 0.5 mg via INTRAVENOUS

## 2020-11-30 MED ORDER — HYDRALAZINE HCL 20 MG/ML IJ SOLN
INTRAMUSCULAR | Status: AC | PRN
  Administered 2020-11-30: 10 mg via INTRAVENOUS

## 2020-11-30 NOTE — Discharge Summary (Signed)
Physician Discharge Summary  Gerald Lloyd ZOX:[id number] DOB: [birth year] DOA: 11/23/2020  PCP: [facility]  Admit date: 11/23/2020 Discharge date: 11/30/2020  Discharge disposition: Home   Recommendations for Outpatient Follow-Up:   Follow-up with Dr. Marisue Humble, nephrologist within 1 week of discharge. Schedule an appointment to see rheumatologist as soon as possible. Follow-up with [facility] cardiology group in 1 week.   Discharge Diagnosis:   Principal Problem:   Acute on chronic diastolic CHF (congestive heart failure) (HCC) Active Problems:   AKI (acute kidney injury) (HCC)   Pericardial effusion   Anemia   Dyspnea   Essential hypertension   Bilateral pleural effusion   SLE (systemic lupus erythematosus related syndrome) (HCC)   Lupus nephritis (HCC)    Discharge Condition: Stable.  Diet recommendation:  Diet Order             Diet renal with fluid restriction Fluid restriction: 1200 mL Fluid; Room service appropriate? Yes; Fluid consistency: Thin  Diet effective now           Diet - low sodium heart healthy                     Code Status: Full Code     Hospital Course:    Mr. Gerald Lloyd is a 60 y.o. 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  He complained of progressive shortness of breath and bilateral leg swelling.  He went to [facility] where CT chest and 2D echo reviewed moderate pericardial effusion.  He was referred to [facility] for further management.  He was admitted to the hospital for acute on chronic diastolic CHF.  He was also found to have large pericardial effusion without evidence of cardiac tamponade on 2D echo. EF was 55 to 60% but there was evidence of grade 2 diastolic dysfunction.  He was treated with IV Lasix.  Cardiologist was  consulted to assist with management.  Eliquis was held and he was switched to IV heparin infusion while inpatient.  He had bilateral pleural effusion and he underwent bilateral thoracentesis with removal of 900 mls of exudative fluid on the right and 900 mls of fluid on the left.  Additional work-up revealed findings consistent with systemic lupus erythematosus and probable lupus nephritis.  He was started on hydroxychloroquine.  Nephrologist was consulted to assist with management.  IR was consulted for renal biopsy.  Biopsy report is pending.  His condition has improved and he is deemed stable for discharge to home today.  Discharge plan was discussed with the patient and his wife at the bedside.     Medical Consultants:   Nephrologist Interventional radiologist Cardiologist   Discharge Exam:    Vitals:   11/30/20 1156 11/30/20 1238 11/30/20 1329 11/30/20 1408  BP: (!) 147/84 133/70 106/67 124/80  Pulse: (!) 101 (!) 104 98 99  Resp: (!) 24  Temp:      TempSrc:      SpO2: 99% 97% 99% 99%  Weight:      Height:         GEN: NAD SKIN: Warm and dry EYES: No pallor or icterus ENT: MMM CV: RRR PULM: CTA B ABD: soft, ND, NT, +BS CNS: AAO x 3, non focal EXT: Bilateral leg edema (1+) is improving.  No erythema or tenderness.   The  results of significant diagnostics from this hospitalization (including imaging, microbiology, ancillary and laboratory) are listed below for reference.     Procedures and Diagnostic Studies:   DG Chest 2 View  Result Date: 11/23/2020 CLINICAL DATA:  Cough for 2 months.  Shortness of breath. EXAM: CHEST - 2 VIEW COMPARISON:  Chest CTA 1 week ago 11/16/2020 at [facility] FINDINGS: Cardiomegaly is similar to prior exam. There are small bilateral pleural effusions that have improved from prior. Associated compressive atelectasis. Improved vascular congestion from prior. Subsegmental atelectasis in the left mid lung zone. No pneumothorax or  confluent airspace disease. IMPRESSION: 1. Vascular congestion and pleural effusions have improved from CT 1 week ago. There are small residual pleural effusions with atelectasis. 2. Cardiomegaly is similar. 3. No acute airspace disease. Electronically Signed   By: Narda Rutherford M.D.   On: 11/23/2020 16:45   ECHOCARDIOGRAM COMPLETE  Result Date: 11/24/2020    ECHOCARDIOGRAM REPORT   Patient Name:   Gerald Lloyd Date of Exam: 11/24/2020 Medical Rec #:  [id number]        Height:       72.0 in Accession #:    [id number]       Weight:       202.8 lb Date of Birth:  26-Mar-1961        BSA:          2.143 m Patient Age:    59 years         BP:           131/87 mmHg Patient Gender: M                HR:           105 bpm. Exam Location:  Inpatient Procedure: 2D Echo, Cardiac Doppler and Color Doppler Indications:    I50.33 Acute on chronic diastolic (congestive) heart failure  History:        Patient has no prior history of Echocardiogram examinations.                 Risk Factors:Hypertension.  Sonographer:    Elmarie Shiley Dance RVT Referring Phys: [id number] Carlton Adam  Sonographer Comments: Reading Cardiologist notified. IMPRESSIONS  1. Left ventricular ejection fraction, by estimation, is 55 to 60%. The left ventricle has normal function. The left ventricle has no regional wall motion abnormalities. Left ventricular diastolic parameters are consistent with Grade II diastolic dysfunction (pseudonormalization). Elevated left ventricular end-diastolic pressure.  2. Right ventricular systolic function is normal. The right ventricular size is normal. There is normal pulmonary artery systolic pressure.  3. Left atrial size was severely dilated.  4. Large pericardial effusion measuring up to 2.3 cm. Right atrial collapse noted but no right ventricular collapse or significant respiratory inflow variation. RA pressure 8 mmHg. Findings are consistent with early tamponade. Recommend serial follow up  and clinical  correlation. Large pericardial effusion. The pericardial effusion is circumferential. There is no evidence of cardiac tamponade. Moderate pleural effusion in the left lateral region.  5. The mitral valve is normal in structure. No evidence of mitral valve regurgitation. No evidence of mitral stenosis.  6. The aortic valve is tricuspid. Aortic valve regurgitation is not visualized. No aortic stenosis is present.  7. Aortic dilatation noted. There is borderline dilatation of the ascending aorta, measuring 36 mm.  8. The inferior vena cava is dilated in size with >50% respiratory variability, suggesting right atrial pressure of 8 mmHg.  9. There is a small patent  foramen ovale. FINDINGS  Left Ventricle: Left ventricular ejection fraction, by estimation, is 55 to 60%. The left ventricle has normal function. The left ventricle has no regional wall motion abnormalities. The left ventricular internal cavity size was normal in size. There is  no left ventricular hypertrophy. Left ventricular diastolic parameters are consistent with Grade II diastolic dysfunction (pseudonormalization). Elevated left ventricular end-diastolic pressure. Right Ventricle: The right ventricular size is normal. No increase in right ventricular wall thickness. Right ventricular systolic function is normal. There is normal pulmonary artery systolic pressure. The tricuspid regurgitant velocity is 1.72 m/s, and  with an assumed right atrial pressure of 8 mmHg, the estimated right ventricular systolic pressure is 19.9 mmHg. Left Atrium: Left atrial size was severely dilated. Right Atrium: Right atrial size was normal in size. Pericardium: Large pericardial effusion measuring up to 2.3 cm. Right atrial collapse noted but no right ventricular collapse or significant respiratory inflow variation. RA pressure 8 mmHg. Findings are consistent with early tamponade. Recommend serial follow up and clinical correlation. A large pericardial effusion is present.  The pericardial effusion is circumferential. There is diastolic collapse of the right atrial wall. There is no evidence of cardiac tamponade. Mitral Valve: The mitral valve is normal in structure. No evidence of mitral valve regurgitation. No evidence of mitral valve stenosis. Tricuspid Valve: The tricuspid valve is normal in structure. Tricuspid valve regurgitation is trivial. No evidence of tricuspid stenosis. Aortic Valve: The aortic valve is tricuspid. Aortic valve regurgitation is not visualized. No aortic stenosis is present. Pulmonic Valve: The pulmonic valve was normal in structure. Pulmonic valve regurgitation is not visualized. No evidence of pulmonic stenosis. Aorta: Aortic dilatation noted. There is borderline dilatation of the ascending aorta, measuring 36 mm. Venous: The inferior vena cava is dilated in size with greater than 50% respiratory variability, suggesting right atrial pressure of 8 mmHg. IAS/Shunts: No atrial level shunt detected by color flow Doppler. A small patent foramen ovale is detected. Additional Comments: There is a moderate pleural effusion in the left lateral region.  LEFT VENTRICLE PLAX 2D LVIDd:         5.30 cm  Diastology LVIDs:         3.50 cm  LV e' medial:    7.07 cm/s LV PW:         1.06 cm  LV E/e' medial:  17.7 LV IVS:        1.20 cm  LV e' lateral:   6.85 cm/s LVOT diam:     2.10 cm  LV E/e' lateral: 18.2 LV SV:         101 LV SV Index:   47 LVOT Area:     3.46 cm  RIGHT VENTRICLE             IVC RV Basal diam:  3.00 cm     IVC diam: 2.00 cm RV Mid diam:    1.60 cm RV S prime:     15.70 cm/s TAPSE (M-mode): 1.6 cm LEFT ATRIUM              Index       RIGHT ATRIUM           Index LA diam:        5.20 cm  2.43 cm/m  RA Area:     16.90 cm LA Vol (A2C):   133.0 ml 62.05 ml/m RA Volume:   44.80 ml  20.90 ml/m LA Vol (A4C):   82.6 ml  38.54 ml/m  LA Biplane Vol: 109.0 ml 50.86 ml/m  AORTIC VALVE LVOT Vmax:   173.00 cm/s LVOT Vmean:  121.000 cm/s LVOT VTI:    0.293 m   AORTA Ao Root diam: 3.50 cm Ao Asc diam:  3.60 cm MITRAL VALVE                TRICUSPID VALVE MV Area (PHT): 4.26 cm     TR Peak grad:   11.9 mmHg MV Decel Time: 178 msec     TR Vmax:        172.39 cm/s MV E velocity: 125.00 cm/s MV A velocity: 90.80 cm/s   SHUNTS MV E/A ratio:  1.38         Systemic VTI:  0.29 m                             Systemic Diam: 2.10 cm Chilton Si MD Electronically signed by Chilton Si MD Signature Date/Time: 11/24/2020/2:42:36 PM    Final      Labs:   Basic Metabolic Panel: Recent Labs  Lab 11/25/20 0909 11/26/20 0252 11/26/20 1221 11/27/20 0232 11/28/20 0424 11/29/20 0432  NA 136 133* 135 136 135 136  K 3.6 3.6 3.7 4.0 3.7 3.9  CL 104 102 103 107 107 107  CO2 GLUCOSE 108* 127* 93 89 95 98  BUN 35* 36* 33* 30* 23* 23*  CREATININE 1.54* 1.49* 1.33* 1.34* 1.19 1.27*  CALCIUM 7.6* 7.4* 7.5* 7.7* 7.7* 7.7*  MG 1.6* 1.8  --  1.8  --   --   PHOS  --  3.1  --   --  2.9 3.3   GFR Estimated Creatinine Clearance: 70.4 mL/min (A) (by C-G formula based on SCr of 1.27 mg/dL (H)). Liver Function Tests: Recent Labs  Lab 11/23/20 1639 11/26/20 1221 11/28/20 0424 11/29/20 0432  AST 25 24  --   --   ALT 21 19  --   --   ALKPHOS 68 61  --   --   BILITOT 0.5 0.4  --   --   PROT 5.1* 4.6*  --   --   ALBUMIN 1.7* 1.5* 1.4* 1.4*   No results for input(s): LIPASE, AMYLASE in the last 168 hours. No results for input(s): AMMONIA in the last 168 hours. Coagulation profile Recent Labs  Lab 11/29/20 0432  INR 1.0    CBC: Recent Labs  Lab 11/23/20 1639 11/24/20 0237 11/27/20 0232 11/27/20 1450 11/28/20 0424 11/29/20 0432 11/30/20 0305  WBC 12.8*   < > 7.9 8.4 7.1 7.5 9.3  NEUTROABS 11.0*  --   --  6.4  --   --   --   HGB 7.9*   < > 9.5* 10.0* 8.7* 8.9* 8.9*  HCT 24.9*   < > 28.5* 30.1* 26.3* 26.7* 26.6*  MCV 86.2   < > 83.1 84.1 84.3 84.0 84.2  PLT 272   < > 366 360 344 359 363   < > = values in this interval not  displayed.   Cardiac Enzymes: No results for input(s): CKTOTAL, CKMB, CKMBINDEX, TROPONINI in the last 168 hours. BNP: Invalid input(s): POCBNP CBG: No results for input(s): GLUCAP in the last 168 hours. D-Dimer No results for input(s): DDIMER in the last 72 hours. Hgb A1c No results for input(s): HGBA1C in the last 72 hours. Lipid Profile No results for input(s): CHOL, HDL, LDLCALC, TRIG, CHOLHDL, LDLDIRECT  in the last 72 hours. Thyroid function studies No results for input(s): TSH, T4TOTAL, T3FREE, THYROIDAB in the last 72 hours.  Invalid input(s): FREET3 Anemia work up No results for input(s): VITAMINB12, FOLATE, FERRITIN, TIBC, IRON, RETICCTPCT in the last 72 hours. Microbiology Recent Results (from the past 240 hour(s))  Resp Panel by RT-PCR (Flu A&B, Covid) Nasopharyngeal Swab     Status: None   Collection Time: 11/23/20 11:01 PM   Specimen: Nasopharyngeal Swab; Nasopharyngeal(NP) swabs in vial transport medium  Result Value Ref Range Status   SARS Coronavirus 2 by RT PCR NEGATIVE NEGATIVE Final    Comment: (NOTE) SARS-CoV-2 target nucleic acids are NOT DETECTED.  The SARS-CoV-2 RNA is generally detectable in upper respiratory specimens during the acute phase of infection. The lowest concentration of SARS-CoV-2 viral copies this assay can detect is 138 copies/mL. A negative result does not preclude SARS-Cov-2 infection and should not be used as the sole basis for treatment or other patient management decisions. A negative result may occur with  improper specimen collection/handling, submission of specimen other than nasopharyngeal swab, presence of viral mutation(s) within the areas targeted by this assay, and inadequate number of viral copies(<138 copies/mL). A negative result must be combined with clinical observations, patient history, and epidemiological information. The expected result is Negative.  Fact Sheet for Patients:   BloggerCourse.comhttps://www.fda.gov/media/152166/download  Fact Sheet for Healthcare Providers:  SeriousBroker.ithttps://www.fda.gov/media/152162/download  This test is no t yet approved or cleared by the [place] FDA and  has been authorized for detection and/or diagnosis of SARS-CoV-2 by FDA under an Emergency Use Authorization (EUA). This EUA will remain  in effect (meaning this test can be used) for the duration of the COVID-19 declaration under Section 564(b)(1) of the Act, 21 U.S.C.section 360bbb-3(b)(1), unless the authorization is terminated  or revoked sooner.       Influenza A by PCR NEGATIVE NEGATIVE Final   Influenza B by PCR NEGATIVE NEGATIVE Final    Comment: (NOTE) The Xpert Xpress SARS-CoV-2/FLU/RSV plus assay is intended as an aid in the diagnosis of influenza from Nasopharyngeal swab specimens and should not be used as a sole basis for treatment. Nasal washings and aspirates are unacceptable for Xpert Xpress SARS-CoV-2/FLU/RSV testing.  Fact Sheet for Patients: BloggerCourse.comhttps://www.fda.gov/media/152166/download  Fact Sheet for Healthcare Providers: SeriousBroker.ithttps://www.fda.gov/media/152162/download  This test is not yet approved or cleared by the [place] FDA and has been authorized for detection and/or diagnosis of SARS-CoV-2 by FDA under an Emergency Use Authorization (EUA). This EUA will remain in effect (meaning this test can be used) for the duration of the COVID-19 declaration under Section 564(b)(1) of the Act, 21 U.S.C. section 360bbb-3(b)(1), unless the authorization is terminated or revoked.  Performed at [facility], [address] [place], [place] [address]   Culture, body fluid w Gram Stain-bottle     Status: None   Collection Time: 11/25/20 12:59 PM   Specimen: Fluid  Result Value Ref Range Status   Specimen Description FLUID RIGHT PLEURAL  Final   Special Requests BOTTLES DRAWN AEROBIC AND ANAEROBIC  Final   Culture   Final    NO GROWTH 5 DAYS Performed at [facility], [address] [place], [place] [address]    Report Status 11/30/2020 FINAL  Final  Gram stain     Status: None   Collection Time: 11/25/20 12:59 PM   Specimen: Fluid  Result Value Ref Range Status   Specimen Description FLUID RIGHT PLEURAL  Final   Special  Requests NONE  Final   Gram Stain   Final    MODERATE WBC PRESENT,BOTH PMN AND MONONUCLEAR NO ORGANISMS SEEN Performed at [facility], [address] [place], [place] [address]    Report Status 11/26/2020 FINAL  Final     Discharge Instructions:   Discharge Instructions     Diet - low sodium heart healthy   Complete by: As directed    Increase activity slowly   Complete by: As directed    No wound care   Complete by: As directed       Allergies as of 11/30/2020   No Known Allergies      Medication List     STOP taking these medications    losartan-hydrochlorothiazide 50-12.5 MG tablet Commonly known as: HYZAAR       TAKE these medications    amLODipine 10 MG tablet Commonly known as: NORVASC Take 1 tablet (10 mg total) by mouth daily. Start taking on: December 01, 2020   Eliquis 5 MG Tabs tablet Generic drug: apixaban Take 1 tablet (5 mg total) by mouth 2 (two) times daily. Start taking on: December 01, 2020   furosemide 40 MG tablet Commonly known as: LASIX Take 40 mg by mouth daily.   hydroxychloroquine 200 MG tablet Commonly known as: PLAQUENIL Take 1 tablet (200 mg total) by mouth daily. Start taking on: December 01, 2020        Follow-up Information     Arita Miss, MD. Schedule an appointment as soon as possible for a visit in 1 week(s).   Specialty: Nephrology Why: We will call with appt details Contact information: [address] [place] [id number] [phone]         Rheumatology, [place]. Schedule an appointment as soon as possible for a visit in 1 week(s).   Contact information: [address] [place]  [address] [phone]         Jodelle Red, MD. Schedule an appointment as soon as possible for a visit in 1 week(s).   Specialty: Cardiology Contact information: [address] [place] [address] [place] [address] [phone]                  Time coordinating discharge: 32 minutes  Signed:  Lurene Shadow  [facility] 11/30/2020, 3:22 PM   Pager on [email or web address]. If 7PM-7AM, please contact night-coverage at [email or web address]

## 2020-11-30 NOTE — Progress Notes (Signed)
[facility] NEPHROLOGY PROGRESS NOTE  Assessment/ Plan: Pt is a 60 y.o. yo male  with history of hypertension, left lower extremity DVT on Eliquis, who was admitted with shortness of breath and pericardial effusion, seen as a consultation for the evaluation of AKI and proteinuria.  #Acute kidney injury with sub-nephrotic range proteinuria/microscopic hematuria presumably due to lupus nephritis.  Patient has positive ANA, elevated double-stranded DNA with low complements.  The finding of pericardial effusion and pleural effusion probably part of autoimmune disease.  GFR is fairly preserved.   S/p renal bx 8/23, path pending After biopsy, if stable, can discharge home and we can further manage this as an outpatient I will arrange for patient to see me in our [place] office on 8/29 Further immunosuppression based upon results of biopsy Given nonrenal manifestations of lupus, will also need rheumatologist C ANCA positive, need PR3 and MPO values, ordered  #Pericardial effusion/pleural effusion: Status post b/l thoracocentesis and clinically feeling better.  This is part of autoimmune disease/lupus.  Seen by cardiologist and on diuretics.  He will need to follow-up with rheumatologist as outpatient.   #Acute on chronic diastolic dysfunction: Initially treated with IV diuretics currently on p.o. Lasix and close to euvolemic.   #Hypertension: Losartan on hold because of AKI.  Blood pressure seems to be in acceptable range with amlodipine and Lasix.  Subjective:  Biopsy completed this morning, uneventful Hemoglobin stable 8.9 Wife, Angie at bedside, updated  Objective Vital signs in last 24 hours: Vitals:   11/30/20 1055 11/30/20 1112 11/30/20 1131 11/30/20 1156  BP: (!) 152/84 (!) 146/82 (!) 142/84 (!) 147/84  Pulse: (!) 106 (!) 104 100 (!) 101  Resp: 20 (!) 21 20 20   Temp:   97.7 F (36.5 C)   TempSrc:   Oral   SpO2: 98% 99% 99% 99%  Weight:      Height:        Weight change: -0.454 kg  Intake/Output Summary (Last 24 hours) at 11/30/2020 1201 Last data filed at 11/30/2020 0820 Gross per 24 hour  Intake 573.9 ml  Output 500 ml  Net 73.9 ml        Labs: Basic Metabolic Panel: Recent Labs  Lab 11/26/20 0252 11/26/20 1221 11/27/20 0232 11/28/20 0424 11/29/20 0432  NA 133*   < > 136 135 136  K 3.6   < > 4.0 3.7 3.9  CL 102   < > 107 107 107  CO2 23   < > 22 22 23   GLUCOSE 127*   < > 89 95 98  BUN 36*   < > 30* 23* 23*  CREATININE 1.49*   < > 1.34* 1.19 1.27*  CALCIUM 7.4*   < > 7.7* 7.7* 7.7*  PHOS 3.1  --   --  2.9 3.3   < > = values in this interval not displayed.    Liver Function Tests: Recent Labs  Lab 11/23/20 1639 11/26/20 1221 11/28/20 0424 11/29/20 0432  AST 25 24  --   --   ALT 21 19  --   --   ALKPHOS 68 61  --   --   BILITOT 0.5 0.4  --   --   PROT 5.1* 4.6*  --   --   ALBUMIN 1.7* 1.5* 1.4* 1.4*    No results for input(s): LIPASE, AMYLASE in the last 168 hours. No results for input(s): AMMONIA in the last 168 hours. CBC: Recent Labs  Lab 11/23/20 1639 11/24/20 0237 11/27/20  2355 11/27/20 1450 11/28/20 0424 11/29/20 0432 11/30/20 0305  WBC 12.8*   < > 7.9 8.4 7.1 7.5 9.3  NEUTROABS 11.0*  --   --  6.4  --   --   --   HGB 7.9*   < > 9.5* 10.0* 8.7* 8.9* 8.9*  HCT 24.9*   < > 28.5* 30.1* 26.3* 26.7* 26.6*  MCV 86.2   < > 83.1 84.1 84.3 84.0 84.2  PLT 272   < > 366 360 344 359 363   < > = values in this interval not displayed.    Cardiac Enzymes: No results for input(s): CKTOTAL, CKMB, CKMBINDEX, TROPONINI in the last 168 hours. CBG: No results for input(s): GLUCAP in the last 168 hours.  Iron Studies: No results for input(s): IRON, TIBC, TRANSFERRIN, FERRITIN in the last 72 hours. Studies/Results: No results found.  Medications: Infusions:  sodium chloride     heparin Stopped (11/30/20 0206)    Scheduled Medications:  amLODipine  10 mg Oral Daily   fentaNYL       furosemide  40  mg Oral Daily   hydroxychloroquine  200 mg Oral Daily   lidocaine (PF)       sodium chloride flush  3 mL Intravenous Q12H    have reviewed scheduled and prn medications.  Physical Exam: General:NAD, comfortable Heart:RRR, s1s2 nl Lungs:clear b/l, no crackle Abdomen:soft, Non-tender, non-distended Extremities: Trace LE edema Neurology: Alert awake, nonfocal  Gerald Lloyd 11/30/2020,12:01 PM  LOS: 7 days

## 2020-11-30 NOTE — Procedures (Signed)
Interventional Radiology Procedure Note  Procedure: US guided random renal biopsy LEFT  Complications: None  Estimated Blood Loss: None  Recommendations: - Bedrest x 4 hrs - Path sent - May resume heparin tomorrow, use SCDs for now    Signed,  Sterling Big, MD

## 2020-11-30 NOTE — Progress Notes (Signed)
ANTICOAGULATION CONSULT NOTE - Follow Up Consult  Pharmacy Consult for heparin Indication: LLE popliteal DVT 05/26/20  No Known Allergies  Patient Measurements: Height: 6' (182.9 cm) Weight: 89.5 kg (197 lb 6.4 oz) IBW/kg (Calculated) : 77.6 Heparin Dosing Weight: 90 kg  Vital Signs: Temp: 97.7 F (36.5 C) (08/23 1131) Temp Source: Oral (08/23 1131) BP: 142/84 (08/23 1131) Pulse Rate: 100 (08/23 1131)  Labs: Recent Labs    11/28/20 0424 11/28/20 1251 11/29/20 0432 11/29/20 2130 11/30/20 0305  HGB 8.7*  --  8.9*  --  8.9*  HCT 26.3*  --  26.7*  --  26.6*  PLT 344  --  359  --  363  LABPROT  --   --  13.6  --   --   INR  --   --  1.0  --   --   HEPARINUNFRC 0.28* 0.47  --  0.32 <0.10*  CREATININE 1.19  --  1.27*  --   --      Estimated Creatinine Clearance: 70.4 mL/min (A) (by C-G formula based on SCr of 1.27 mg/dL (H)).   Assessment: 60 yo M with hx LLE popliteal DVT 05/26/20 on apixaban PTA. Apixaban held for procedure, last dose 8/17 @ 3:30am. Pharmacy consulted 11/24/20 for heparin.  Heparin level undetectable this morning but heparin off for IR renal biopsy at 0200. Previous heparin level therapeutic at 0.47 on 2450 units/hr. CBC stable.  Goal of Therapy:  Heparin level 0.3-0.7 units/ml Monitor platelets by anticoagulation protocol: Yes   Plan:  - Restart heparin infusion at 2450 units/hr 8/24 @ 0800 - Check HL in 6-8 hours - CBC and HL daily qAM - Monitor for s/sx of bleeding   Thank you for allowing pharmacy to be a part of this patient's care.  Thelma Barge, PharmD Clinical Pharmacist

## 2020-11-30 NOTE — Progress Notes (Signed)
ANTICOAGULATION CONSULT NOTE - Follow Up Consult  Pharmacy Consult for heparin Indication: LLE popliteal DVT 05/26/20  No Known Allergies  Patient Measurements: Height: 6' (182.9 cm) Weight: 89.5 kg (197 lb 6.4 oz) IBW/kg (Calculated) : 77.6 Heparin Dosing Weight: 90kg  Vital Signs: Temp: 97.9 F (36.6 C) (08/23 0412) Temp Source: Oral (08/23 0412) BP: 159/86 (08/23 0412) Pulse Rate: 102 (08/23 0412)  Labs: Recent Labs    11/28/20 0424 11/28/20 1251 11/29/20 0432 11/29/20 2130 11/30/20 0305  HGB 8.7*  --  8.9*  --  8.9*  HCT 26.3*  --  26.7*  --  26.6*  PLT 344  --  359  --  363  LABPROT  --   --  13.6  --   --   INR  --   --  1.0  --   --   HEPARINUNFRC 0.28* 0.47  --  0.32 <0.10*  CREATININE 1.19  --  1.27*  --   --      Estimated Creatinine Clearance: 70.4 mL/min (A) (by C-G formula based on SCr of 1.27 mg/dL (H)).   Assessment: 60 yo M with hx LLE popliteal DVT 05/26/20 on apixaban PTA. Apixaban held for procedure, last dose 8/17 @ 3:30am.  Pharmacy consulted 11/24/20 for heparin.  Heparin level undetectable this morning but heparin off for IR procedure at 0200.   Goal of Therapy:  Heparin level 0.3-0.7 units/ml Monitor platelets by anticoagulation protocol: Yes   Plan:  F/u restart heparin post IR  Thank you for including pharmacy in the care of this patient.  Christoper Fabian, PharmD, BCPS Please see amion for complete clinical pharmacist phone list 11/30/2020 5:20 AM

## 2020-11-30 NOTE — Progress Notes (Signed)
Patient discharged: Home with family  Via: Wheelchair   Discharge paperwork given: to patient and family  Reviewed with teach back  IV and telemetry disconnected  Belongings given to patient    

## 2020-11-30 NOTE — Plan of Care (Signed)
  Problem: Education: Goal: Ability to demonstrate management of disease process will improve Outcome: Progressing   Problem: Clinical Measurements: Goal: Ability to maintain clinical measurements within normal limits will improve Outcome: Progressing

## 2020-12-01 LAB — ANCA TITERS
Atypical P-ANCA titer: <1:20 {titer}
C-ANCA: <1:20 {titer}
P-ANCA: <1:20 {titer}

## 2020-12-02 ENCOUNTER — Encounter (HOSPITAL_BASED_OUTPATIENT_CLINIC_OR_DEPARTMENT_OTHER): Payer: Self-pay | Admitting: Cardiology

## 2020-12-02 ENCOUNTER — Other Ambulatory Visit: Payer: Self-pay

## 2020-12-02 ENCOUNTER — Ambulatory Visit (HOSPITAL_BASED_OUTPATIENT_CLINIC_OR_DEPARTMENT_OTHER): Payer: Commercial Managed Care - PPO | Admitting: Cardiology

## 2020-12-02 VITALS — BP 150/99 | HR 115 | Ht 72.0 in | Wt 203.9 lb

## 2020-12-02 DIAGNOSIS — Z09 Encounter for follow-up examination after completed treatment for conditions other than malignant neoplasm: Secondary | ICD-10-CM

## 2020-12-02 DIAGNOSIS — M329 Systemic lupus erythematosus, unspecified: Secondary | ICD-10-CM

## 2020-12-02 DIAGNOSIS — R601 Generalized edema: Secondary | ICD-10-CM

## 2020-12-02 DIAGNOSIS — I313 Pericardial effusion (noninflammatory): Secondary | ICD-10-CM

## 2020-12-02 DIAGNOSIS — J90 Pleural effusion, not elsewhere classified: Secondary | ICD-10-CM

## 2020-12-02 DIAGNOSIS — I3139 Other pericardial effusion (noninflammatory): Secondary | ICD-10-CM

## 2020-12-02 NOTE — Patient Instructions (Signed)
Medication Instructions:  Your Physician recommend you continue on your current medication as directed.    *If you need a refill on your cardiac medications before your next appointment, please call your pharmacy*   Lab Work: None ordered today   Testing/Procedures: Your physician has requested that you have an echocardiogram in 2 weeks. Echocardiography is a painless test that uses sound waves to create images of your heart. It provides your doctor with information about the size and shape of your heart and how well your heart's chambers and valves are working. This procedure takes approximately one hour. There are no restrictions for this procedure. [facility]    Follow-Up: At [facility], you and your health needs are our priority.  As part of our continuing mission to provide you with exceptional heart care, we have created designated Provider Care Teams.  These Care Teams include your primary Cardiologist (physician) and Advanced Practice Providers (APPs -  Physician Assistants and Nurse Practitioners) who all work together to provide you with the care you need, when you need it.  We recommend signing up for the patient portal called "MyChart".  Sign up information is provided on this After Visit Summary.  MyChart is used to connect with patients for Virtual Visits (Telemedicine).  Patients are able to view lab/test results, encounter notes, upcoming appointments, etc.  Non-urgent messages can be sent to your provider as well.   To learn more about what you can do with MyChart, go to [email or web address].    Your next appointment:   4 -5week(s)  The format for your next appointment:   In Person  Provider:   Jodelle Red, MD

## 2020-12-02 NOTE — Progress Notes (Signed)
Cardiology Office Note:    Date:  12/02/2020   ID:  Tushar Enns, DOB 06/25/1960, MRN [id number]  PCP:  Bonnita Hollow, MD  Cardiologist:  Buford Dresser, MD  Referring MD: [facility]   CC: post hospital follow up/transitions of care within 7 days of discharge   History of Present Illness:    Gerald Lloyd is a 60 y.o. male with a hx of anemia, DVT, and hypertension, who is seen for hospital follow-up. I met him during this hospitalization in 11/2020. Reviewed hospital course, workup, and medications at length today. Working diagnosis is systemic autoimmune process (possibly lupus) resulting in systemic volume overload, pericardial and pleural effusions.  He is accompanied by his wife, who also provides some history. Overall, he is feeling alright but fatigued. He continues to have shortness of breath, and is coughing at night. His dry cough is severe enough to cause him to feel sore, and he struggles to breathe. Sometimes when waking up in the morning he feels stomach tightness, like a dry heave.  Since his hospital visit, his breathing improved initially, but seems to have gradually worsened. He becomes short of breath after walking across a parking lot.  He takes Lasix daily, and still has bilateral LE edema. This morning his weight at home was within 5 pounds of his weight at clinic today.  Lately his blood pressure has been elevated. At work this morning it was in the 638'G systolic. His medications were adjusted in the hospital  On Monday he will follow-up with his nephrologist.  He denies any palpitations. No lightheadedness, headaches, or syncope.   We reviewed his hospital course at length, as well as plans for next steps from a cardiac perspective.   Past Medical History:  Diagnosis Date   Anemia    DVT (deep venous thrombosis) ([place])    Hypertension     Past Surgical History:  Procedure Laterality Date   IR THORACENTESIS ASP PLEURAL  SPACE W/IMG GUIDE  11/25/2020   IR THORACENTESIS ASP PLEURAL SPACE W/IMG GUIDE  11/26/2020    Current Medications: Current Outpatient Medications on File Prior to Visit  Medication Sig   amLODipine (NORVASC) 10 MG tablet Take 1 tablet (10 mg total) by mouth daily.   ELIQUIS 5 MG TABS tablet Take 1 tablet (5 mg total) by mouth 2 (two) times daily.   furosemide (LASIX) 40 MG tablet Take 40 mg by mouth daily.   hydroxychloroquine (PLAQUENIL) 200 MG tablet Take 1 tablet (200 mg total) by mouth daily.   No current facility-administered medications on file prior to visit.     Allergies:   Patient has no known allergies.   Social History   Tobacco Use   Smoking status: Never   Smokeless tobacco: Never  Substance Use Topics   Alcohol use: Not Currently    Comment: Drinks one beer every few weeks   Drug use: Never    Family History: Father has polycythemia, stroke in his 26's Paternal Grandfather collapsed from heart attack in his 71's Mother Breast and colon cancer No siblings  ROS:   Please see the history of present illness.  Additional pertinent ROS otherwise unremarkable.  EKGs/Labs/Other Studies Reviewed:    The following studies were reviewed today:  Echo 11/24/2020: 1. Left ventricular ejection fraction, by estimation, is 55 to 60%. The  left ventricle has normal function. The left ventricle has no regional  wall motion abnormalities. Left ventricular diastolic parameters are  consistent with Grade II diastolic  dysfunction (pseudonormalization). Elevated left ventricular end-diastolic  pressure.   2. Right ventricular systolic function is normal. The right ventricular  size is normal. There is normal pulmonary artery systolic pressure.   3. Left atrial size was severely dilated.   4. Large pericardial effusion measuring up to 2.3 cm. Right atrial  collapse noted but no right ventricular collapse or significant  respiratory inflow variation. RA pressure 8 mmHg.  Findings are consistent  with early tamponade. Recommend serial follow up   and clinical correlation. Large pericardial effusion. The pericardial  effusion is circumferential. There is no evidence of cardiac tamponade.  Moderate pleural effusion in the left lateral region.   5. The mitral valve is normal in structure. No evidence of mitral valve  regurgitation. No evidence of mitral stenosis.   6. The aortic valve is tricuspid. Aortic valve regurgitation is not  visualized. No aortic stenosis is present.   7. Aortic dilatation noted. There is borderline dilatation of the  ascending aorta, measuring 36 mm.   8. The inferior vena cava is dilated in size with >50% respiratory  variability, suggesting right atrial pressure of 8 mmHg.   9. There is a small patent foramen ovale.  EKG:  EKG is personally reviewed.   12/02/2020: sinus tachycardia at 115 bpm  Recent Labs: 11/23/2020: B Natriuretic Peptide 490.3 11/26/2020: ALT 19 11/27/2020: Magnesium 1.8 11/29/2020: BUN 23; Creatinine, Ser 1.27; Potassium 3.9; Sodium 136 11/30/2020: Hemoglobin 8.9; Platelets 363  Recent Lipid Panel    Component Value Date/Time   CHOL 201 (H) 11/25/2020 0909   TRIG 140 11/25/2020 0909   HDL 22 (L) 11/25/2020 0909   CHOLHDL 9.1 11/25/2020 0909   VLDL 28 11/25/2020 0909   LDLCALC 151 (H) 11/25/2020 0909    Physical Exam:    VS:  BP (!) 150/99   Pulse (!) 115   Ht 6' (1.829 m)   Wt 203 lb 14.4 oz (92.5 kg)   SpO2 100%   BMI 27.65 kg/m     Wt Readings from Last 3 Encounters:  12/02/20 203 lb 14.4 oz (92.5 kg)  11/30/20 197 lb 6.4 oz (89.5 kg)    GEN: Well nourished, well developed in no acute distress HEENT: Normal, moist mucous membranes NECK: No JVD CARDIAC: regular rhythm, normal S1 and S2, no rubs or gallops. No murmur. VASCULAR: Radial and DP pulses 2+ bilaterally. No carotid bruits RESPIRATORY:  fluid R>L lower lung fields, otherwise clear to auscultation without wheezing or rhonchi   ABDOMEN: Soft, non-tender, non-distended MUSCULOSKELETAL:  Ambulates independently SKIN: Warm and dry, trace-1+ right LE edema, 1-2+ left LE edema NEUROLOGIC:  Alert and oriented x 3. No focal neuro deficits noted. PSYCHIATRIC:  Normal affect    ASSESSMENT:    1. Pericardial effusion   2. Bilateral pleural effusion   3. SLE (systemic lupus erythematosus related syndrome) ([place])   4. Anasarca   5. Hospital discharge follow-up    PLAN:    Systemic inflammatory process, possibly lupus, resulting in: Anemia Anasarca/hypoalbuminemia Acute kidney injury Pleural effusion Pericardial effusion -see full hospital summary. My concern on initial consult (consult was for pericardial effusion) was for a driving systemic process. I was concerned for inflammation vs. Malignancy; ESR/CRP very elevated, medicine proceeded with evaluation, now suggests lupus. Renal biopsy pending for possibly lupus nephritis  In terms of his pericardial effusion, no JVD, has been running hypertensive, not consistent with clinical tamponade. His systemic process will need to be treated well before this is likely to resolve. Will  recheck limited echo in several weeks to monitor size. We again reviewed red flag warning signs that need immediate medical attention  DVT: back on anticoagulation. Question now if this was sequelae of inflammatory process   Plan for follow up: 4-5 weeks or sooner as needed. Echo in 2-3 weeks.  Buford Dresser, MD, PhD, [facility]    Medication Adjustments/Labs and Tests Ordered: Current medicines are reviewed at length with the patient today.  Concerns regarding medicines are outlined above.   Orders Placed This Encounter  Procedures   EKG 12-Lead   ECHOCARDIOGRAM LIMITED    No orders of the defined types were placed in this encounter.  Patient Instructions  Medication Instructions:  Your Physician recommend you continue on your current medication as  directed.    *If you need a refill on your cardiac medications before your next appointment, please call your pharmacy*   Lab Work: None ordered today   Testing/Procedures: Your physician has requested that you have an echocardiogram in 2 weeks. Echocardiography is a painless test that uses sound waves to create images of your heart. It provides your doctor with information about the size and shape of your heart and how well your heart's chambers and valves are working. This procedure takes approximately one hour. There are no restrictions for this procedure. [place]: At [facility], you and your health needs are our priority.  As part of our continuing mission to provide you with exceptional heart care, we have created designated Provider Care Teams.  These Care Teams include your primary Cardiologist (physician) and Advanced Practice Providers (APPs -  Physician Assistants and Nurse Practitioners) who all work together to provide you with the care you need, when you need it.  We recommend signing up for the patient portal called "MyChart".  Sign up information is provided on this After Visit Summary.  MyChart is used to connect with patients for Virtual Visits (Telemedicine).  Patients are able to view lab/test results, encounter notes, upcoming appointments, etc.  Non-urgent messages can be sent to your provider as well.   To learn more about what you can do with MyChart, go to [email or web address].    Your next appointment:   4 -5week(s)  The format for your next appointment:   In Person  Provider:   Buford Dresser, MD     [facility],acting as a scribe for Buford Dresser, MD.,have documented all relevant documentation on the behalf of Buford Dresser, MD,as directed by  Buford Dresser, MD while in the presence of Buford Dresser, MD.  I, Buford Dresser, MD, have reviewed all documentation for this visit.  The documentation on 12/02/20 for the exam, diagnosis, procedures, and orders are all accurate and complete.   Signed, Buford Dresser, MD PhD 12/02/2020 6:55 PM    [place]

## 2020-12-07 ENCOUNTER — Other Ambulatory Visit (HOSPITAL_COMMUNITY): Payer: Self-pay | Admitting: *Deleted

## 2020-12-07 LAB — PH, BODY FLUID: pH, Body Fluid: 7.4

## 2020-12-09 ENCOUNTER — Ambulatory Visit (HOSPITAL_BASED_OUTPATIENT_CLINIC_OR_DEPARTMENT_OTHER): Payer: Commercial Managed Care - PPO | Admitting: Cardiology

## 2020-12-09 LAB — SURGICAL PATHOLOGY

## 2020-12-12 MED ORDER — MYCOPHENOLATE MOFETIL 500 MG TABLET
ORAL_TABLET | 2 refills | 30 days
Start: 2020-12-12 — End: 2021-12-12

## 2020-12-14 ENCOUNTER — Encounter (HOSPITAL_COMMUNITY): Payer: Self-pay

## 2020-12-14 ENCOUNTER — Other Ambulatory Visit: Payer: Self-pay

## 2020-12-14 ENCOUNTER — Encounter (HOSPITAL_COMMUNITY)
Admission: RE | Admit: 2020-12-14 | Discharge: 2020-12-14 | Disposition: A | Discharge status: Home / Self Care | Payer: Commercial Managed Care - PPO | Source: Ambulatory Visit | Attending: Nephrology | Admitting: Nephrology

## 2020-12-14 DIAGNOSIS — M3214 Glomerular disease in systemic lupus erythematosus: Secondary | ICD-10-CM | POA: Insufficient documentation

## 2020-12-14 MED ORDER — SODIUM CHLORIDE 0.9 % IV SOLN
Freq: Once | INTRAVENOUS | Status: AC
  Administered 2020-12-14: 250 mL via INTRAVENOUS

## 2020-12-14 MED ORDER — SODIUM CHLORIDE 0.9 % IV SOLN
500.0000 mg | Freq: Once | INTRAVENOUS | Status: AC
  Administered 2020-12-14: 500 mg via INTRAVENOUS
  Filled 2020-12-14: qty 4

## 2020-12-15 ENCOUNTER — Encounter (HOSPITAL_COMMUNITY)
Admission: RE | Admit: 2020-12-15 | Discharge: 2020-12-15 | Disposition: A | Discharge status: Home / Self Care | Payer: Commercial Managed Care - PPO | Source: Ambulatory Visit | Attending: Nephrology | Admitting: Nephrology

## 2020-12-15 ENCOUNTER — Encounter (HOSPITAL_COMMUNITY): Payer: Commercial Managed Care - PPO

## 2020-12-15 DIAGNOSIS — M3214 Glomerular disease in systemic lupus erythematosus: Secondary | ICD-10-CM | POA: Diagnosis not present

## 2020-12-15 MED ORDER — SODIUM CHLORIDE 0.9 % IV SOLN: Freq: Once | INTRAVENOUS | Status: AC

## 2020-12-15 MED ORDER — SODIUM CHLORIDE 0.9 % IV SOLN
500.0000 mg | Freq: Once | INTRAVENOUS | Status: AC
  Administered 2020-12-15: 500 mg via INTRAVENOUS
  Filled 2020-12-15: qty 4

## 2020-12-16 ENCOUNTER — Encounter (HOSPITAL_COMMUNITY): Payer: Self-pay

## 2020-12-16 ENCOUNTER — Encounter (HOSPITAL_COMMUNITY): Payer: Commercial Managed Care - PPO

## 2020-12-16 ENCOUNTER — Other Ambulatory Visit: Payer: Self-pay

## 2020-12-16 ENCOUNTER — Encounter (HOSPITAL_COMMUNITY)
Admission: RE | Admit: 2020-12-16 | Discharge: 2020-12-16 | Disposition: A | Discharge status: Home / Self Care | Payer: Commercial Managed Care - PPO | Source: Ambulatory Visit | Attending: Nephrology | Admitting: Nephrology

## 2020-12-16 DIAGNOSIS — M3214 Glomerular disease in systemic lupus erythematosus: Secondary | ICD-10-CM | POA: Diagnosis not present

## 2020-12-16 MED ORDER — SODIUM CHLORIDE 0.9 % IV SOLN
500.0000 mg | Freq: Once | INTRAVENOUS | Status: AC
  Administered 2020-12-16: 500 mg via INTRAVENOUS
  Filled 2020-12-16: qty 4

## 2020-12-16 MED ORDER — SODIUM CHLORIDE 0.9 % IV SOLN: INTRAVENOUS | Status: DC

## 2020-12-17 ENCOUNTER — Encounter (HOSPITAL_COMMUNITY): Payer: Commercial Managed Care - PPO

## 2020-12-21 NOTE — Progress Notes (Signed)
Office Visit Note  Patient: Gerald Lloyd             Date of Birth: [birth year]           MRN: [id number]             PCP: Bonnita Hollow, MD Referring: Bonnita Hollow, MD Visit Date: 12/22/2020  Subjective:  New Patient (Initial Visit) (Patient was hospitalized 1 month ago. Patient is still working to rebuild his strength after hospitalization and weight loss. Patient was started on medication regimen after being evaluated by Dr. Joelyn Oms at [facility].)   History of Present Illness: [name] is a 60 y.o. male with history of lower extremity DVT on eliquis anticoagulation here for new diagnosis systemic lupus.  In February he was evaluated for unilateral left leg swelling with ultrasound exam finding a DVT in the left popliteal vein unprovoked.  There was ongoing local swelling of the left calf for several months.  He then presented to the hospital due to shortness of breath.  Work-up identified bilateral pleural effusions, large circumferential pericardial effusion with tamponade and volume overload, lupus nephritis. He was started with pulse dose steroids, cellcept, and hydroxychloroquine.  And headache very good improvement after starting the high-dose steroids.  He has followed up for this with Dr. Joelyn Oms at [facility] on induction therapy for lupus nephritis.  He currently feels that most of his symptoms are much improved he does have a generalized weakness and decreased exertion tolerance ever since his hospital stay. He denies any particular skin rashes, alopecia, arthritis, oral ulcers, or Raynaud's symptoms.  He does not know of any family history of autoimmune disease.  He has never experienced similar symptoms prior to these events.  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   Patient Denies nocturnal pain.  Difficulty dressing/grooming: Denies Difficulty climbing stairs: Denies Difficulty getting out of chair: Denies Difficulty using hands for taps, buttons, cutlery, and/or writing: Denies  Review of Systems  Constitutional:  Positive for weight loss. Negative for fatigue.  HENT:  Negative for mouth sores, mouth dryness and nose dryness.   Eyes:  Negative for pain, itching, visual disturbance and dryness.  Respiratory:  Positive for cough. Negative for hemoptysis, shortness of breath and difficulty breathing.   Cardiovascular:  Positive for swelling in legs/feet. Negative for chest pain and palpitations.  Gastrointestinal:  Negative for abdominal pain, blood in stool, constipation and diarrhea.  Endocrine: Negative for increased urination.  Genitourinary:  Negative for painful urination.  Musculoskeletal:  Negative for joint pain, joint pain, joint swelling, myalgias, muscle weakness, morning stiffness, muscle tenderness and myalgias.  Skin:  Negative for color change, rash and redness.  Allergic/Immunologic: Negative for susceptible to infections.  Neurological:  Negative for dizziness, numbness, headaches, memory loss and weakness.  Hematological:  Negative for swollen glands.  Psychiatric/Behavioral:  Negative for confusion and sleep disturbance.    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/27/2020   Bilateral pleural  effusion 11/25/2020   AKI (acute kidney injury) ([place]) 11/24/2020   Pericardial effusion 11/24/2020   Anemia 11/24/2020   Dyspnea 11/24/2020   Essential hypertension 11/24/2020   Acute on chronic diastolic CHF (congestive heart failure) ([place]) 11/23/2020    Past Medical History:  Diagnosis Date   Anemia    DVT (deep venous thrombosis) ([place])    Hypertension     Family History  Problem Relation Age of Onset   Breast cancer Mother    Colon cancer Mother    Diabetes Father    Healthy Son    Healthy Son    Past Surgical History:  Procedure Laterality Date   IR THORACENTESIS ASP PLEURAL SPACE W/IMG GUIDE  11/25/2020   IR THORACENTESIS ASP PLEURAL SPACE W/IMG GUIDE  11/26/2020   Social History   Social History Narrative   Not on file    There is no immunization history on file for this patient.   Objective: Vital Signs: BP 135/84 (BP Location: Right Arm, Patient Position: Sitting, Cuff Size: Normal)   Pulse 96   Ht '5\' 10"'  (1.778 m)   Wt 186 lb (84.4 kg)   BMI 26.69 kg/m    Physical Exam Cardiovascular:     Rate and Rhythm: Regular rhythm. Tachycardia present.  Pulmonary:     Effort: Pulmonary effort is normal.     Comments: Right lung base inspiratory crackles Skin:    General: Skin is warm and dry.     Comments: Normal nailfold capillaries Bilateral 2+ pitting edema of ankles to halfway up shins  Neurological:     General: No focal deficit present.     Mental Status: He is alert.     Deep Tendon Reflexes: Reflexes normal.  Psychiatric:        Mood and Affect: Mood normal.     Musculoskeletal Exam:  Neck full ROM no tenderness Shoulders full ROM no tenderness or swelling Elbows full ROM no tenderness or swelling Wrists full ROM no tenderness or swelling Fingers full ROM no tenderness or swelling Knees full ROM no tenderness or swelling Ankles full ROM no tenderness or swelling    Investigation: No additional findings.  Imaging: ECHOCARDIOGRAM  LIMITED  Result Date: 12/24/2020    ECHOCARDIOGRAM LIMITED REPORT   Patient Name:   [name] Date of Exam: 12/24/2020 Medical Rec #:  [id number]        Height:       70.0 in Accession #:    [id number]       Weight:       186.0 lb Date of Birth:  1960/04/24        BSA:          2.024 m Patient Age:    36 years         BP:           135/84 mmHg Patient Gender: M                HR:           96 bpm. Exam Location:  [facility] Procedure: Limited Echo, Cardiac Doppler and Color Doppler Indications:    Pericardial effusion  History:        Patient has prior history of Echocardiogram examinations, most  recent 11/24/2020. Pericardial Disease, Arrythmias:SVT,                 Signs/Symptoms:Shortness of Breath and LE edema, anemia; Risk                 Factors:Hypertension. Pleural effusion.  Sonographer:    Dustin Flock RDCS Referring Phys: [id number] [place]  1. Left ventricular ejection fraction, by estimation, is 60 to 65%. The left ventricle has normal function. The left ventricle has no regional wall motion abnormalities. Left ventricular diastolic parameters were normal.  2. Right ventricular systolic function is normal. The right ventricular size is normal. There is normal pulmonary artery systolic pressure.  3. A small to moderate (1.0 cm) pericardial effusion is present. The pericardial effusion is localized near the right atrium. There is also a trivial collection posteriorly.  4. Mild mitral valve regurgitation.  5. The inferior vena cava is normal in size with greater than 50% respiratory variability, suggesting right atrial pressure of 3 mmHg. Comparison(s): Prior images reviewed side by side. There has been significant improvement although not resolution of the pericardial effusion. FINDINGS  Left Ventricle: Left ventricular ejection fraction, by estimation, is 60 to 65%. The left ventricle has normal function. The left ventricle has no regional wall motion  abnormalities. The left ventricular internal cavity size was normal in size. There is  borderline left ventricular hypertrophy. Left ventricular diastolic parameters were normal. Right Ventricle: The right ventricular size is normal. No increase in right ventricular wall thickness. Right ventricular systolic function is normal. There is normal pulmonary artery systolic pressure. The tricuspid regurgitant velocity is 2.51 m/s, and  with an assumed right atrial pressure of 3 mmHg, the estimated right ventricular systolic pressure is 31.4 mmHg. Pericardium: A small pericardial effusion is present. The pericardial effusion is localized near the right atrium. Mitral Valve: Mild mitral valve regurgitation. Tricuspid Valve: The tricuspid valve is grossly normal. Tricuspid valve regurgitation is trivial. Venous: The inferior vena cava is normal in size with greater than 50% respiratory variability, suggesting right atrial pressure of 3 mmHg. LEFT VENTRICLE PLAX 2D LVIDd:         5.20 cm      Diastology LVIDs:         3.50 cm      LV e' medial:    8.59 cm/s LV PW:         1.10 cm      LV E/e' medial:  12.3 LV IVS:        1.00 cm      LV e' lateral:   16.60 cm/s                             LV E/e' lateral: 6.4  LV Volumes (MOD) LV vol d, MOD A4C: 123.0 ml LV vol s, MOD A4C: 51.0 ml LV SV MOD A4C:     123.0 ml MITRAL VALVE                TRICUSPID VALVE MV Area (PHT): 4.44 cm     TR Peak grad:   25.2 mmHg MV Decel Time: 171 msec     TR Vmax:        251.00 cm/s MV E velocity: 106.00 cm/s MV A velocity: 78.00 cm/s MV E/A ratio:  1.36 Rozann Lesches MD Electronically signed by Rozann Lesches MD Signature Date/Time: 12/24/2020/12:32:55 PM    Final     Recent Labs:  Lab Results  Component Value Date   WBC 9.3 11/30/2020   HGB 8.9 (L) 11/30/2020   PLT 363 11/30/2020   NA 136 11/29/2020   K 3.9 11/29/2020   CL 107 11/29/2020   CO2 23 11/29/2020   GLUCOSE 98 11/29/2020   BUN 23 (H) 11/29/2020   CREATININE 1.27 (H)  11/29/2020   BILITOT 0.4 11/26/2020   ALKPHOS 61 11/26/2020   AST 24 11/26/2020   ALT 19 11/26/2020   PROT 4.6 (L) 11/26/2020   ALBUMIN 1.4 (L) 11/29/2020   CALCIUM 7.7 (L) 11/29/2020    Speciality Comments: No specialty comments available.  Procedures:  No procedures performed Allergies: Patient has no known allergies.   Assessment / Plan:     Visit Diagnoses: SLE (systemic lupus erythematosus related syndrome) ([place]) - Plan: Cardiolipin antibodies, IgG, IgM, IgA, Phosphatidylserine IgG,IgA,IgM, Beta-2 glycoprotein antibodies  New diagnosis of systemic lupus with multiple major organ system involvement including at least the heart and lungs and kidneys.  Not sure whether the DVT is indicative for antiphospholipid antibody syndrome or more the hypercoagulability of proteinuria with class V nephritis.  We will check antiphospholipid antibodies today.  He is currently on an aggressive induction regimen with high-dose prednisone taper hydroxychloroquine 400 mg daily and mycophenolate quickly titrating up to 1500 mg twice daily.  Agree with this plan for now and it should be adequate to suppress any extrarenal disease activity as well.  We will plan to follow-up in about 3 months to reassess his progress on further prednisone taper.   Deep vein thrombosis (DVT) of popliteal vein of left lower extremity, unspecified chronicity (HCC)  Checking for antiphospholipid antibodies with the DVT history and now with known lupus diagnosis.  If high risk seropositive would be repeat after 12 weeks but if confirmed Coumadin anticoagulation should be considered for maximum efficacy.  Orders: Orders Placed This Encounter  Procedures   Cardiolipin antibodies, IgG, IgM, IgA   Phosphatidylserine IgG,IgA,IgM   Beta-2 glycoprotein antibodies    No orders of the defined types were placed in this encounter.    Follow-Up Instructions: Return in about 3 months (around 03/23/2021) for New pt SLE f/u  20mo.   CCollier Salina MD  Note - This record has been created using [employer]  Chart creation errors have been sought, but may not always  have been located. Such creation errors do not reflect on  the standard of medical care.

## 2020-12-22 ENCOUNTER — Ambulatory Visit: Payer: Commercial Managed Care - PPO | Admitting: Internal Medicine

## 2020-12-22 ENCOUNTER — Other Ambulatory Visit: Payer: Self-pay

## 2020-12-22 ENCOUNTER — Encounter: Payer: Self-pay | Admitting: Internal Medicine

## 2020-12-22 VITALS — BP 135/84 | HR 96 | Ht 70.0 in | Wt 186.0 lb

## 2020-12-22 DIAGNOSIS — I313 Pericardial effusion (noninflammatory): Secondary | ICD-10-CM | POA: Diagnosis not present

## 2020-12-22 DIAGNOSIS — M3214 Glomerular disease in systemic lupus erythematosus: Secondary | ICD-10-CM | POA: Diagnosis not present

## 2020-12-22 DIAGNOSIS — I3139 Other pericardial effusion (noninflammatory): Secondary | ICD-10-CM

## 2020-12-22 DIAGNOSIS — I82402 Acute embolism and thrombosis of unspecified deep veins of left lower extremity: Secondary | ICD-10-CM | POA: Insufficient documentation

## 2020-12-22 DIAGNOSIS — M329 Systemic lupus erythematosus, unspecified: Secondary | ICD-10-CM | POA: Diagnosis not present

## 2020-12-22 DIAGNOSIS — I82432 Acute embolism and thrombosis of left popliteal vein: Secondary | ICD-10-CM

## 2020-12-22 NOTE — Unmapped (Signed)
[facility] Specialty Medication Onboarding    Specialty Medication: Mycophenolate 500mg  tablet  Prior Authorization: Not Required   Financial Assistance: No - copay  <$25  Final Copay/Day Supply: $0 / 30 days    Insurance Restrictions: None     Notes to Pharmacist: Transferred in from [facility]. Per request from PE pt is almost out of med, and would like shipped out on 09/15 if possible.    The triage team has completed the benefits investigation and has determined that the patient is able to fill this medication at [facility]. Please contact the patient to complete the onboarding or follow up with the prescribing physician as needed.

## 2020-12-22 NOTE — Unmapped (Signed)
The patient declined counseling on medication administration, missed dose instructions, goals of therapy, side effects and monitoring parameters, warnings and precautions, drug/food interactions and storage, handling precautions, and disposal because they have taken the medication previously. The information in the declined sections below are for informational purposes only and was not discussed with patient.       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  I am speaking to the patient.    Was a Nurse, learning disability used for this call? No    Verified patient's date of birth / HIPAA.    Specialty medication(s) to be sent: Inflammatory Disorders: mycophenolate      Non-specialty medications/supplies to be sent: n/a      Medications not needed at this time: n/a         Cellcept (mycopheonlate mofetil)    Medication & Administration     Dosage: Take 3 tablets (1500mg ) by mouth two times daily    Administration:   ??? Take by mouth with or without food.   ??? Taking with food can minimize GI side effects.   ??? Swallow capsules whole, do not crush or chew.  ??? Oral suspension should be shaken well prior to administration.  Do not mix with other medications and discard any unused portion 60 days after constitution.      Adherence/Missed dose instructions:  Take a missed dose as soon as you remember it . If it is close to the time of your next dose, skip the missed dose and resume your normal schedule.Never take 2 doses to try and catch up from a missed dose.    Goals of Therapy     Reduce immune response    Side Effects & Monitoring Parameters     ??? Feeling tired or weak  ??? Shakiness  ??? Trouble sleeping  ??? Diarrhea, abdominal pain, nausea, vomiting, constipation or decreased appetite  ??? Decreases in blood counts   ??? Back or joint pain  ??? Hypertension or hypotension  ??? High blood sugar  ??? Headache  ??? Skin rash    The following side effects should be reported to the provider:  ??? Reduced immune function - report signs of infection such as fever; chills; body aches; very bad sore throat; ear or sinus pain; cough; more sputum or change in color of sputum; pain with passing urine; wound that will not heal, etc.  Also at a slightly higher risk of some malignancies (mainly skin and blood cancers) due to this reduced immune function.  ??? Allergic reaction (rash, hives, swelling, shortness of breath)  ??? High blood sugar (confusion, feeling sleepy, more thirst, more hungry, passing urine more often, flushing, fast breathing, or breath that smells like fruit)  ??? Electrolyte issues (mood changes, confusion, muscle pain or weakness, a heartbeat that does not feel normal, seizures, not hungry, or very bad upset stomach or throwing up)  ??? High or low blood pressure (bad headache or dizziness, passing out, or change in eyesight)  ??? Kidney issues (unable to pass urine, change in how much urine is passed, blood in the urine, or a big weight gain)  ??? Skin (oozing, heat, swelling, redness, or pain), UTI and other infections   ??? Chest pain or pressure  ??? Abnormal heartbeat  ??? Unexplained bleeding or bruising  ??? Abnormal burning, numbness, or tingling  ??? Muscle cramps,  ???  Yellowing of skin or eyes    Monitoring parameters  ??? Pregnancy   ??? CBC   ??? Renal and hepatic function    Contraindications, Warnings, & Precautions     ??? *This is a REMS drug and an FDA-approved patient medication guide will be printed with each dispensation  ??? Black Box Warning: Infections   ??? Black Box Warning: Lymphoproliferative disorders - risk of development of lymphoma and skin malignancy is increased  ??? Black Box Warning: Use during pregnancy is associated with increased risks of first trimester pregnancy loss and congenital malformations.   ??? Black Box Warning: Females of reproductive potential should use contraception during treatment and for 6 weeks after therapy is discontinued  ??? CNS depression  ??? New or reactivated viral infections  ??? Neutropenia  ??? Male patients and/or their male partners should use effective contraception during treatment of the male patient and for at least 3 months after last dose.  ??? Breastfeeding is not recommended during therapy and for 6 weeks after last dose    Drug/Food Interactions     ??? Medication list reviewed in Epic. The patient was instructed to inform the care team before taking any new medications or supplements. No drug interactions identified.   ??? Separate doses of antacids and this medication  ??? Check with your doctor before getting any vaccinations    Storage, Handling Precautions, & Disposal     ??? Store at room temperature in a dry place  ??? This medication is considered hazardous. Wash hands after handling and store out of reach or others, including children and pets.      Current Medications (including OTC/herbals), Comorbidities and Allergies     Current Outpatient Medications   Medication Sig Dispense Refill   ??? amLODIPine (NORVASC) 10 MG tablet Take 10 mg by mouth daily.     ??? ELIQUIS 5 mg Tab Take 5 mg by mouth Two (2) times a day.     ??? furosemide (LASIX) 80 MG tablet Take 80 mg by mouth Two (2) times a day.     ??? hydrOXYchloroQUINE (PLAQUENIL) 200 mg tablet Take 200 mg by mouth daily.     ??? losartan-hydrochlorothiazide (HYZAAR) 50-12.5 mg per tablet Take 1 tablet by mouth daily.     ??? mycophenolate (CELLCEPT) 500 mg tablet 1 tablet (500 mg total) Two (2) times a day for 7 days, THEN 2 tablets (1,000 mg total) Two (2) times a day for 7 days, THEN 3 tablets (1,500 mg total) Two (2) times a day. 180 tablet 2   ??? sulfamethoxazole-trimethoprim (BACTRIM DS) 800-160 mg per tablet 1 (ONE) TABLET BY MOUTH EVERY MONDAY, WEDNESDAY, FRIDAY, BEGIN AFTER STARTING THE MYCOPHENOLATE       No current facility-administered medications for this visit.       No Known Allergies    There is no problem list on file for this patient.      Reviewed and up to date in Epic.    Appropriateness of Therapy     Acute infections noted within Epic:  No active infections  Patient reported infection: None    Is medication and dose appropriate based on diagnosis and infection status? Yes    Prescription has been clinically reviewed: Yes      Baseline Quality of Life Assessment      How many days over the past month did your glomerular disease  keep you from your normal activities? For example, brushing your teeth or getting up in the  morning. 0    Financial Information     Medication Assistance provided: None Required    Anticipated copay of $0 reviewed with patient. Verified delivery address.    Delivery Information     Scheduled delivery date: 12/24/20    Expected start date: 12/24/20    Medication will be delivered via UPS to the prescription address in [facility].  This shipment will not require a signature.      Explained the services we provide at [facility] and that each month we would call to set up refills.  Stressed importance of returning phone calls so that we could ensure they receive their medications in time each month.  Informed patient that we should be setting up refills 7-10 days prior to when they will run out of medication.  A pharmacist will reach out to perform a clinical assessment periodically.  Informed patient that a welcome packet, containing information about our pharmacy and other support services, a Notice of Privacy Practices, and a drug information handout will be sent.      The patient or caregiver noted above participated in the development of this care plan and knows that they can request review of or adjustments to the care plan at any time.      Patient or caregiver verbalized understanding of the above information as well as how to contact the pharmacy at [phone] option 4 with any questions/concerns.  The pharmacy is open Monday through Friday 8:30am-4:30pm.  A pharmacist is available 24/7 via pager to answer any clinical questions they may have.    Patient Specific Needs     - Does the patient have any physical, cognitive, or cultural barriers? No    - Does the patient have adequate living arrangements? (i.e. the ability to store and take their medication appropriately) Yes    - Did you identify any home environmental safety or security hazards? No    - Patient prefers to have medications discussed with  Patient     - Is the patient or caregiver able to read and understand education materials at a high school level or above? Yes    - Patient's primary language is  English     - Is the patient high risk? Yes, patient is taking a REMS drug. Medication is dispensed in compliance with REMS program    - Does the patient require physician intervention or other additional services (i.e. dietary/nutrition, smoking cessation, social work)? No      Arnold Long  [facility] Specialty Pharmacist

## 2020-12-22 NOTE — Patient Instructions (Signed)
Cardiolipin Antibodies Test Why am I having this test? Your health care provider may order a cardiolipin antibodies test if: You have a condition in which the blood clots in an abnormal way (hypercoagulable state). You have signs or symptoms of an autoimmune disease, especially systemic lupus erythematosus (SLE). You have had one or more pregnancy losses (miscarriage). What is being tested? This test measures the level of cardiolipin antibodies, also known as anticardiolipin antibodies, in the blood. Antibodies are proteins made by your body's defense (immune) system. Normal antibodies protect you against germs and other things that can make you sick. Cardiolipin antibodies are abnormal antibodies. They attack fat molecules in the outer layer of body cells and may also attack important blood-clotting cells (platelets). When your immune system attacks normal body cells, it is called an autoimmune reaction. What kind of sample is taken? A blood sample is required for this test. It is usually collected by inserting a needle into a blood vessel. How do I prepare for this test? Let your health care provider know if you have had a recent infection and if you are taking any medicines. Some infections and medicines can cause your body to develop cardiolipin antibodies. How are the results reported? Your test results will generally be reported as either positive or negative for cardiolipin antibodies. For this test, normal results are: Negative for cardiolipin antibodies. Other results may be reported as values. Your health care provider will compare your results to normal ranges that were established after testing a large group of people (reference ranges). Reference ranges may vary among labs and hospitals. For this test, common normal reference ranges are: Less than 23 GPL (IgG phospholipid units). Less than 11 MPL (IgM phospholipid units). What do the results mean? A negative test result means that no  cardiolipin antibodies were found in your blood at the time of the test. A positive test result, or results that are higher than the normal reference ranges, could mean that you may be at increased risk for: Miscarriages (if you become pregnant), low platelet counts, and blood clots. This condition is called antiphospholipid syndrome. An autoimmune disease, especially SLE. A positive result may also be seen in patients who have cancer, AIDS, or an infection, or who take certain medicines. Talk with your health care provider about what your results mean. Questions to ask your health care provider Ask your health care provider, or the department that is doing the test: When will my results be ready? How will I get my results? What are my treatment options? What other tests do I need? What are my next steps? Summary A cardiolipin antibodies test may be done if you have abnormal blood clotting, symptoms of an autoimmune disease, such as SLE, or recurrent miscarriages. Cardiolipin antibodies are abnormal antibodies. They attack fat molecules in the outer layer of body cells and may also attack platelets. A positive test result could mean that you are at increased risk for miscarriages (if you become pregnant), low platelet counts, and blood clots. This condition is called antiphospholipid syndrome.  Lupus Anticoagulant Panel Test Why am I having this test? The lupus anticoagulant panel test can be used to help find the cause of abnormal blood clotting. Your health care provider may recommend this test if: You are a woman and have had repeated miscarriages, preterm labor, or high blood pressure in pregnancy (preeclampsia). You have had previous blood tests showing a longer-than-normal blood clotting time or a lower-than-normal number of platelets (thrombocytopenia). Your health care  provider suspects that you have a condition called antiphospholipid syndrome. You have systemic lupus erythematosus  (SLE, or lupus) or your health care provider suspects that you have this condition. Your health care provider suspects that you have some type of rheumatic disease. What is being tested? This test checks your blood for certain autoantibodies (lupus anticoagulant autoantibodies) that can interfere with the normal blood clotting process. Autoantibodies are proteins that your body's defense system (immune system) makes to help fight invading germs, such as bacteria and viruses. Sometimes, the body mistakes normal tissues for abnormal ones, and it attacks those tissues as if they were invading germs. This is called an autoimmune response. The autoantibodies checked for in this test can mistakenly attack phospholipids, which are a normal part of many types of cells in the body. When blood clotting cells (platelets) are attacked by antiphospholipid antibodies, abnormal blood clotting can occur. When this happens, you are at an increased risk of developing repeated blood clots in your arteries and veins. You are also at an increased risk for heart attack or stroke. What kind of sample is taken? A blood sample is required for this test. It is usually collected by inserting a needle into a blood vessel or by sticking a finger with a small needle. Tell a health care provider about: All medicines you are taking, including vitamins, herbs, eye drops, creams, and over-the-counter medicines. Any medical conditions you have. How are the results reported? Your test results will be reported as values that indicate whether lupus anticoagulant autoantibodies were found in your blood. Your health care provider will compare your results to normal values that were established after testing a large group of people (reference values). Reference values may vary among labs and hospitals. For this test, common normal reference values are: Less than 11 MPL (IgM phospholipid units). Less than 23 GPL (IgG phospholipid units). What  do the results mean? Results that are higher than the reference values may indicate the following health conditions: Lupus. Antiphospholipid syndrome. Talk with your health care provider about what your results mean. Questions to ask your health care provider Ask your health care provider, or the department that is doing the test: When will my results be ready? How will I get my results? What are my treatment options? What other tests do I need? What are my next steps? Summary The lupus anticoagulant panel test can be used to help find the cause of abnormal blood clotting. This test checks your blood for certain proteins (autoantibodies) that can interfere with the normal blood clotting process. The autoantibodies checked for in this test can mistakenly attack phospholipids, which are a normal part of many types of cells in the body. If the autoantibodies attack blood clotting cells (platelets), you may develop frequent blood clots. Talk with your health care provider about what your test results mean.

## 2020-12-23 MED FILL — MYCOPHENOLATE MOFETIL 500 MG TABLET: 30 days supply | Qty: 180 | Fill #0

## 2020-12-24 ENCOUNTER — Ambulatory Visit (HOSPITAL_COMMUNITY)
Admission: RE | Admit: 2020-12-24 | Discharge: 2020-12-24 | Disposition: A | Discharge status: Home / Self Care | Payer: Commercial Managed Care - PPO | Source: Ambulatory Visit | Attending: Cardiology | Admitting: Cardiology

## 2020-12-24 ENCOUNTER — Other Ambulatory Visit: Payer: Self-pay

## 2020-12-24 DIAGNOSIS — I313 Pericardial effusion (noninflammatory): Secondary | ICD-10-CM | POA: Insufficient documentation

## 2020-12-24 DIAGNOSIS — I3139 Other pericardial effusion (noninflammatory): Secondary | ICD-10-CM

## 2020-12-24 LAB — ECHOCARDIOGRAM LIMITED
Area-P 1/2: 4.44 cm2
S' Lateral: 3.5 cm
Single Plane A4C EF: 58.5 %

## 2020-12-24 NOTE — Progress Notes (Signed)
  Echocardiogram 2D Echocardiogram has been performed.  Pieter Partridge 12/24/2020, 8:57 AM

## 2020-12-27 NOTE — Progress Notes (Signed)
Cardiology Office Note:    Date:  12/30/2020   ID:  Gerald Lloyd, DOB 1960/12/20, MRN [id number]  PCP:  Bonnita Hollow, MD  Cardiologist:  Buford Dresser, MD  Referring MD: Bonnita Hollow, MD   CC: follow up   History of Present Illness:    Gerald Lloyd is a 60 y.o. male with a hx of anemia, DVT, and hypertension, who is seen for hospital follow-up. I met him during this hospitalization in 11/2020. Reviewed hospital course, workup, and medications at length today. Working diagnosis is systemic autoimmune process (possibly lupus) resulting in systemic volume overload, pericardial and pleural effusions.  Today: He is feeling okay, but notes losing significant weight since before his hospitalization 11/2020. Previously he was 210 pounds, and now he is 173 pounds.   Over the past week he has noticed he is able to complete daily activities that he could not before. Now he can push the trash can up the driveway, and he can walk down the hall without having to stop and rest. However, he does feel like he still needs to keep working on building his strength.  Since his last visit his breathing and LE edema have improved significantly. He continues to have some mild swelling in his bilateral feet. Also, he develops tingling sensations in his feet if he stands for too long.  If sitting down for a while, he needs to get up slowly to avoid lightheadedness. This applies to other positional changes, as well as needing to slowly walk up stairs rather than move quickly.  At this visit he complains of a scratchy throat, likely due to his cough. He is also tolerant of his medications.  He denies any palpitations, or chest pain. No headaches, syncope, orthopnea, or PND.    Past Medical History:  Diagnosis Date   Anemia    DVT (deep venous thrombosis) ([place])    Hypertension     Past Surgical History:  Procedure Laterality Date   IR THORACENTESIS ASP PLEURAL SPACE W/IMG GUIDE   11/25/2020   IR THORACENTESIS ASP PLEURAL SPACE W/IMG GUIDE  11/26/2020    Current Medications: Current Outpatient Medications on File Prior to Visit  Medication Sig   amLODipine (NORVASC) 10 MG tablet Take 1 tablet (10 mg total) by mouth daily.   ELIQUIS 5 MG TABS tablet Take 1 tablet (5 mg total) by mouth 2 (two) times daily.   furosemide (LASIX) 40 MG tablet Take 80 mg by mouth in the morning and at bedtime.   hydroxychloroquine (PLAQUENIL) 200 MG tablet Take 1 tablet (200 mg total) by mouth daily.   mycophenolate (CELLCEPT) 500 MG tablet Take 500 mg by mouth 2 (two) times daily. X 1 week, 2nd week 2 tabs in the morning and 2 tabs in evening. 3rd week 3 tabs each morning and 3 each evening.   predniSONE (DELTASONE) 20 MG tablet Take 60 mg by mouth daily.   Sulfamethoxazole-Trimethoprim (BACTRIM DS PO) Take 1 tablet by mouth daily. Monday- Wednesday and Friday   No current facility-administered medications on file prior to visit.     Allergies:   Patient has no known allergies.   Social History   Tobacco Use   Smoking status: Never   Smokeless tobacco: Never  Vaping Use   Vaping Use: Never used  Substance Use Topics   Alcohol use: Yes    Comment: Occasionally   Drug use: Never    Family History: Father has polycythemia, stroke in his 61's Paternal Grandfather collapsed  from heart attack in his 79's Mother Breast and colon cancer No siblings  ROS:   Please see the history of present illness.   (+) Bilateral LE edema, feet (+) Tingling sensation, bilateral feet (+) Positional lightheadedness (+) Sore throat (+) Cough Additional pertinent ROS otherwise unremarkable.  EKGs/Labs/Other Studies Reviewed:    The following studies were reviewed today:  Echo 12/24/2020: 1. Left ventricular ejection fraction, by estimation, is 60 to 65%. The  left ventricle has normal function. The left ventricle has no regional  wall motion abnormalities. Left ventricular diastolic  parameters were  normal.   2. Right ventricular systolic function is normal. The right ventricular  size is normal. There is normal pulmonary artery systolic pressure.   3. A small to moderate (1.0 cm) pericardial effusion is present. The  pericardial effusion is localized near the right atrium. There is also a  trivial collection posteriorly.   4. Mild mitral valve regurgitation.   5. The inferior vena cava is normal in size with greater than 50%  respiratory variability, suggesting right atrial pressure of 3 mmHg.   Comparison(s): Prior images reviewed side by side. There has been  significant improvement although not resolution of the pericardial  effusion.   Echo 11/24/2020: 1. Left ventricular ejection fraction, by estimation, is 55 to 60%. The  left ventricle has normal function. The left ventricle has no regional  wall motion abnormalities. Left ventricular diastolic parameters are  consistent with Grade II diastolic  dysfunction (pseudonormalization). Elevated left ventricular end-diastolic  pressure.   2. Right ventricular systolic function is normal. The right ventricular  size is normal. There is normal pulmonary artery systolic pressure.   3. Left atrial size was severely dilated.   4. Large pericardial effusion measuring up to 2.3 cm. Right atrial  collapse noted but no right ventricular collapse or significant  respiratory inflow variation. RA pressure 8 mmHg. Findings are consistent  with early tamponade. Recommend serial follow up   and clinical correlation. Large pericardial effusion. The pericardial  effusion is circumferential. There is no evidence of cardiac tamponade.  Moderate pleural effusion in the left lateral region.   5. The mitral valve is normal in structure. No evidence of mitral valve  regurgitation. No evidence of mitral stenosis.   6. The aortic valve is tricuspid. Aortic valve regurgitation is not  visualized. No aortic stenosis is present.   7.  Aortic dilatation noted. There is borderline dilatation of the  ascending aorta, measuring 36 mm.   8. The inferior vena cava is dilated in size with >50% respiratory  variability, suggesting right atrial pressure of 8 mmHg.   9. There is a small patent foramen ovale.  EKG:  EKG is personally reviewed. 12/30/2020: NSR at 88 bpm, with LVH/repol pattern   12/02/2020: sinus tachycardia at 115 bpm  Recent Labs: 11/23/2020: B Natriuretic Peptide 490.3 11/26/2020: ALT 19 11/27/2020: Magnesium 1.8 11/29/2020: BUN 23; Creatinine, Ser 1.27; Potassium 3.9; Sodium 136 11/30/2020: Hemoglobin 8.9; Platelets 363   Recent Lipid Panel    Component Value Date/Time   CHOL 201 (H) 11/25/2020 0909   TRIG 140 11/25/2020 0909   HDL 22 (L) 11/25/2020 0909   CHOLHDL 9.1 11/25/2020 0909   VLDL 28 11/25/2020 0909   LDLCALC 151 (H) 11/25/2020 0909    Physical Exam:    VS:  BP 114/72   Pulse 88   Ht 6' (1.829 m)   Wt 173 lb 9.6 oz (78.7 kg)   SpO2 99%   BMI  23.54 kg/m     Wt Readings from Last 3 Encounters:  12/30/20 173 lb 9.6 oz (78.7 kg)  12/22/20 186 lb (84.4 kg)  12/14/20 200 lb (90.7 kg)    GEN: Well nourished, well developed in no acute distress HEENT: Normal, moist mucous membranes NECK: No JVD CARDIAC: regular rhythm, normal S1 and S2, no rubs or gallops. No murmur. VASCULAR: Radial and DP pulses 2+ bilaterally. No carotid bruits RESPIRATORY:  slightly diminished with fluid R>L lower lung fields, otherwise clear to auscultation without wheezing or rhonchi  ABDOMEN: Soft, non-tender, non-distended MUSCULOSKELETAL:  Ambulates independently SKIN: Warm and dry, trivial LE edema NEUROLOGIC:  Alert and oriented x 3. No focal neuro deficits noted. PSYCHIATRIC:  Normal affect    ASSESSMENT:    1. SLE (systemic lupus erythematosus related syndrome) ([place])   2. 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 Pericardial effusion   5. Bilateral pleural effusion   6. Deep vein thrombosis (DVT) of  popliteal vein of left lower extremity, unspecified chronicity ([facility])   7. Essential hypertension    PLAN:    Systemic inflammatory process, likely lupus, resulting in: Anemia Anasarca/hypoalbuminemia Acute kidney injury Pleural effusion Pericardial effusion -overall he is improving with treatment of his lupus -no evidence of clinical tamponade -discussed red flag warning signs that need immediate medical attention  DVT: on anticoagulation  Hypertension: -continue amlodipine given renal issues, monitor edema  Plan for follow up: 3 months or sooner as needed.  Buford Dresser, MD, PhD, [facility]    Medication Adjustments/Labs and Tests Ordered: Current medicines are reviewed at length with the patient today.  Concerns regarding medicines are outlined above.   Orders Placed This Encounter  Procedures   EKG 12-Lead   No orders of the defined types were placed in this encounter.  Patient Instructions  Medication Instructions:  Your Physician recommend you continue on your current medication as directed.    *If you need a refill on your cardiac medications before your next appointment, please call your pharmacy*   Lab Work: None ordered today   Testing/Procedures: None ordered today   Follow-Up: At [facility], you and your health needs are our priority.  As part of our continuing mission to provide you with exceptional heart care, we have created designated Provider Care Teams.  These Care Teams include your primary Cardiologist (physician) and Advanced Practice Providers (APPs -  Physician Assistants and Nurse Practitioners) who all work together to provide you with the care you need, when you need it.  We recommend signing up for the patient portal called "MyChart".  Sign up information is provided on this After Visit Summary.  MyChart is used to connect with patients for Virtual Visits (Telemedicine).  Patients are able to view  lab/test results, encounter notes, upcoming appointments, etc.  Non-urgent messages can be sent to your provider as well.   To learn more about what you can do with MyChart, go to [email or web address].    Your next appointment:   3 month(s)  The format for your next appointment:   In Person  Provider:   Buford Dresser, MD      [facility],acting as a scribe for Buford Dresser, MD.,have documented all relevant documentation on the behalf of Buford Dresser, MD,as directed by  Buford Dresser, MD while in the presence of Buford Dresser, MD.  I, Buford Dresser, MD, have reviewed all documentation for this visit. The documentation on 12/30/20 for the exam, diagnosis,  procedures, and orders are all accurate and complete.   Signed, Buford Dresser, MD PhD 12/30/2020 9:00 AM    [place]

## 2020-12-29 LAB — BETA-2 GLYCOPROTEIN ANTIBODIES
Beta-2 Glyco 1 IgA: 9.2 U/mL
Beta-2 Glyco 1 IgM: <2 U/mL
Beta-2 Glyco I IgG: 81 U/mL — ABNORMAL HIGH

## 2020-12-29 LAB — CARDIOLIPIN ANTIBODIES, IGG, IGM, IGA
Anticardiolipin IgA: 10.5 APL-U/mL
Anticardiolipin IgG: 66.4 GPL-U/mL — ABNORMAL HIGH
Anticardiolipin IgM: <2 MPL-U/mL

## 2020-12-29 LAB — PHOSPHATIDYLSERINE IGG, IGA, IGM
PHOSPHATIDYLSERINE AB  (IGG): <10 U/mL (ref <10)
PHOSPHATIDYLSERINE AB  (IGM): <25 U/mL (ref <25)
PHOSPHATIDYLSERINE AB (IGA): <20 U/mL (ref <20)

## 2020-12-29 NOTE — Progress Notes (Signed)
Multiple tests for the lupus related antibodies that can increase clotting risk are positive. We will need to recheck these to confirm in about 12 weeks after the initial positive, so we can do that at our scheduled f/u.

## 2020-12-30 ENCOUNTER — Encounter (HOSPITAL_BASED_OUTPATIENT_CLINIC_OR_DEPARTMENT_OTHER): Payer: Self-pay | Admitting: Cardiology

## 2020-12-30 ENCOUNTER — Other Ambulatory Visit: Payer: Self-pay

## 2020-12-30 ENCOUNTER — Ambulatory Visit (HOSPITAL_BASED_OUTPATIENT_CLINIC_OR_DEPARTMENT_OTHER): Payer: Commercial Managed Care - PPO | Admitting: Cardiology

## 2020-12-30 VITALS — BP 114/72 | HR 88 | Ht 72.0 in | Wt 173.6 lb

## 2020-12-30 DIAGNOSIS — M329 Systemic lupus erythematosus, unspecified: Secondary | ICD-10-CM | POA: Diagnosis not present

## 2020-12-30 DIAGNOSIS — R601 Generalized edema: Secondary | ICD-10-CM | POA: Diagnosis not present

## 2020-12-30 DIAGNOSIS — M3214 Glomerular disease in systemic lupus erythematosus: Secondary | ICD-10-CM

## 2020-12-30 DIAGNOSIS — I82432 Acute embolism and thrombosis of left popliteal vein: Secondary | ICD-10-CM

## 2020-12-30 DIAGNOSIS — I10 Essential (primary) hypertension: Secondary | ICD-10-CM

## 2020-12-30 DIAGNOSIS — I313 Pericardial effusion (noninflammatory): Secondary | ICD-10-CM

## 2020-12-30 DIAGNOSIS — J90 Pleural effusion, not elsewhere classified: Secondary | ICD-10-CM

## 2020-12-30 DIAGNOSIS — I5033 Acute on chronic diastolic (congestive) heart failure: Secondary | ICD-10-CM

## 2020-12-30 DIAGNOSIS — I3139 Other pericardial effusion (noninflammatory): Secondary | ICD-10-CM

## 2020-12-30 NOTE — Patient Instructions (Signed)

## 2021-01-03 NOTE — Progress Notes (Signed)
Depending on results different anticoagulation may be better but we would need the repeat tests for confirmation first. His current regimen should be largely effective as well so not much risk in the meantime even if it does turn out positive.

## 2021-01-13 NOTE — Unmapped (Signed)
[facility] Clinical Assessment & Refill Coordination Note    Lenzy Kerschner, DOB: [birth year]  Phone: [phone] (home) [phone] (work)    All above HIPAA information was verified with patient.     Was a Nurse, learning disability used for this call? No    Specialty Medication(s):   Inflammatory Disorders: mycophenolate     Current Outpatient Medications   Medication Sig Dispense Refill   ??? amLODIPine (NORVASC) 10 MG tablet Take 10 mg by mouth daily.     ??? ELIQUIS 5 mg Tab Take 5 mg by mouth Two (2) times a day.     ??? furosemide (LASIX) 80 MG tablet Take 80 mg by mouth Two (2) times a day.     ??? hydrOXYchloroQUINE (PLAQUENIL) 200 mg tablet Take 200 mg by mouth daily.     ??? losartan-hydrochlorothiazide (HYZAAR) 50-12.5 mg per tablet Take 1 tablet by mouth daily.     ??? mycophenolate (CELLCEPT) 500 mg tablet 1 tablet (500 mg total) Two (2) times a day for 7 days, THEN 2 tablets (1,000 mg total) Two (2) times a day for 7 days, THEN 3 tablets (1,500 mg total) Two (2) times a day. 180 tablet 2   ??? sulfamethoxazole-trimethoprim (BACTRIM DS) 800-160 mg per tablet 1 (ONE) TABLET BY MOUTH EVERY MONDAY, WEDNESDAY, FRIDAY, BEGIN AFTER STARTING THE MYCOPHENOLATE       No current facility-administered medications for this visit.        Changes to medications: Delsin reports no changes at this time.    No Known Allergies    Changes to allergies: No    SPECIALTY MEDICATION ADHERENCE     mycophenolate 500 mg: ~7 days of medicine on hand       Medication Adherence    Patient reported X missed doses in the last month: 0  Specialty Medication: mycophenolate  Patient is on additional specialty medications: No  Informant: patient          Specialty medication(s) dose(s) confirmed: Regimen is correct and unchanged.     Are there any concerns with adherence? No    Adherence counseling provided? Not needed    CLINICAL MANAGEMENT AND INTERVENTION      Clinical Benefit Assessment:    Do you feel the medicine is effective or helping your condition? Unable to determine - states from what he understands it seems to be doing what it's supposed to    Clinical Benefit counseling provided? Not needed    Adverse Effects Assessment:    Are you experiencing any side effects? No    Are you experiencing difficulty administering your medicine? No    Quality of Life Assessment:    Quality of Life    Rheumatology  Oncology  Dermatology  Cystic Fibrosis          How many days over the past month did your SLE  keep you from your normal activities? For example, brushing your teeth or getting up in the morning. 0    Have you discussed this with your provider? Not needed    Acute Infection Status:    Acute infections noted within Epic:  No active infections  Patient reported infection: None    Therapy Appropriateness:    Is therapy appropriate and patient progressing towards therapeutic goals? Yes, therapy is appropriate and should be continued    DISEASE/MEDICATION-SPECIFIC INFORMATION      N/A    PATIENT SPECIFIC NEEDS     - Does the patient have any  physical, cognitive, or cultural barriers? No    - Is the patient high risk? Yes, patient is taking a REMS drug. Medication is dispensed in compliance with REMS program    - Does the patient require a Care Management Plan? No     - Does the patient require physician intervention or other additional services (i.e. nutrition, smoking cessation, social work)? No      SHIPPING     Specialty Medication(s) to be Shipped:   Inflammatory Disorders: mycophenolate    Other medication(s) to be shipped: No additional medications requested for fill at this time     Changes to insurance: No    Delivery Scheduled: Yes, Expected medication delivery date: 01/18/21.     Medication will be delivered via UPS to the confirmed prescription address in [facility].    The patient will receive a drug information handout for each medication shipped and additional FDA Medication Guides as required.  Verified that patient has previously received a Conservation officer, historic buildings and a Surveyor, mining.    The patient or caregiver noted above participated in the development of this care plan and knows that they can request review of or adjustments to the care plan at any time.      All of the patient's questions and concerns have been addressed.    Arnold Long   [facility] Specialty Pharmacist

## 2021-01-17 MED FILL — MYCOPHENOLATE MOFETIL 500 MG TABLET: 30 days supply | Qty: 180 | Fill #1

## 2021-02-07 MED ORDER — MYCOPHENOLATE MOFETIL 500 MG TABLET
ORAL_TABLET | 2 refills | 37.00000 days | Status: CN
Start: 2021-02-07 — End: 2022-02-07

## 2021-02-07 NOTE — Unmapped (Signed)
[facility] Refill Coordination Note    Specialty Medication(s) to be Shipped:   General Specialty: mycophenolate 500mg        Other medication(s) to be shipped: No additional medications requested for fill at this time     Miguel Sharp, DOB: [birth year]  Phone: [phone] (home) [phone] (work)      All above HIPAA information was verified with patient.     Was a Nurse, learning disability used for this call? No    Completed refill call assessment today to schedule patient's medication shipment from the [facility] [phone]).  All relevant notes have been reviewed.     Specialty medication(s) and dose(s) confirmed: Regimen is correct and unchanged.   Changes to medications: pt decrease on Prednisone every 2 weeks until 5mg ,Septra stop on 11/02  Changes to insurance: No  New side effects reported not previously addressed with a pharmacist or physician: None reported  Questions for the pharmacist: No    Confirmed patient received a Conservation officer, historic buildings and a Surveyor, mining with first shipment. The patient will receive a drug information handout for each medication shipped and additional FDA Medication Guides as required.       DISEASE/MEDICATION-SPECIFIC INFORMATION        N/A    SPECIALTY MEDICATION ADHERENCE     Medication Adherence    Patient reported X missed doses in the last month: 0  Specialty Medication: Mycophenolate 500mg   Patient is on additional specialty medications: No  Patient is on more than two specialty medications: No  Any gaps in refill history greater than 2 weeks in the last 3 months: no  Demonstrates understanding of importance of adherence: yes  Informant: patient  Reliability of informant: reliable  Provider-estimated medication adherence level: good  Patient is at risk for Non-Adherence: No  Reasons for non-adherence: no problems identified  Confirmed plan for next specialty medication refill: delivery by pharmacy  Refills needed for supportive medications: not needed          Refill Coordination    Has the Patients' Contact Information Changed: No  Is the Shipping Address Different: No         Were doses missed due to medication being on hold? No      Mycophenolate  500 mg: 10 days of medicine on hand     REFERRAL TO PHARMACIST     Referral to the pharmacist: Not needed      [facility]     Shipping address confirmed in Epic.     Delivery Scheduled: Yes, Expected medication delivery date: 10/08.  However, Rx request for refills was sent to the provider as there are none remaining.     Medication will be delivered via UPS to the prescription address in Epic WAM.    Antonietta Barcelona   [facility] Specialty Technician

## 2021-02-14 NOTE — Unmapped (Signed)
Miguel Sharp 's Mycophenolate shipment will be delayed as a result of no refills remain on the prescription.      I have reached out to the patient  at [phone] and communicated the delay. We will call the patient back to reschedule the delivery upon resolution. We have not confirmed the new delivery date.

## 2021-02-17 NOTE — Unmapped (Signed)
Miguel Sharp 's Mycophenolate shipment will be delayed as a result of no refills remain on the prescription.      I have reached out to the patient  at [phone] and was unable to leave a message. We will call the patient back to reschedule the delivery upon resolution. We have not confirmed the new delivery date.

## 2021-02-22 MED ORDER — MYCOPHENOLATE MOFETIL 500 MG TABLET
ORAL_TABLET | Freq: Two times a day (BID) | ORAL | 3 refills | 90 days
Start: 2021-02-22 — End: ?

## 2021-02-22 NOTE — Unmapped (Signed)
[facility] Refill Coordination Note    Specialty Medication(s) to be Shipped:   General Specialty: mycophenolate 500mg     Other medication(s) to be shipped: No additional medications requested for fill at this time     Miguel Sharp, DOB: 1961/02/01  Phone: [phone] (home) [phone] (work)      All above HIPAA information was verified with patient.     Was a Nurse, learning disability used for this call? No    Completed refill call assessment today to schedule patient's medication shipment from the [facility] [phone]).  All relevant notes have been reviewed.     Specialty medication(s) and dose(s) confirmed: Regimen is correct and unchanged.   Changes to medications: Miguel Sharp reports no changes at this time.  Changes to insurance: No  New side effects reported not previously addressed with a pharmacist or physician: None reported  Questions for the pharmacist: No    Confirmed patient received a Conservation officer, historic buildings and a Surveyor, mining with first shipment. The patient will receive a drug information handout for each medication shipped and additional FDA Medication Guides as required.       DISEASE/MEDICATION-SPECIFIC INFORMATION        N/A    SPECIALTY MEDICATION ADHERENCE     Medication Adherence    Patient reported X missed doses in the last month: 0  Specialty Medication: mycophenolate 500 mg tablet (CELLCEPT)  Patient is on additional specialty medications: No  Patient is on more than two specialty medications: No  Any gaps in refill history greater than 2 weeks in the last 3 months: no  Demonstrates understanding of importance of adherence: yes  Informant: patient  Reliability of informant: reliable  Confirmed plan for next specialty medication refill: delivery by pharmacy  Refills needed for supportive medications: not needed              Were doses missed due to medication being on hold? No    mycophenolate 500 mg : 5 days of medicine on hand       REFERRAL TO PHARMACIST     Referral to the pharmacist: Not needed      [facility]     Shipping address confirmed in Epic.     Delivery Scheduled: Yes, Expected medication delivery date: 02/25/21.     Medication will be delivered via UPS to the prescription address in Epic WAM.    Miguel Sharp   [facility] Specialty Technician

## 2021-02-22 NOTE — Unmapped (Signed)
Miguel Sharp 's Mycophenolate shipment will be delayed as a result of a new prescription for the medication has been received.      I have reached out to the patient  at [phone] and was unable to leave a message. We will wait for a call back from the patient to reschedule the delivery.  We have not confirmed the new delivery date.      Pt's mailbox was full. Sent text message to patient as well.

## 2021-02-24 MED FILL — MYCOPHENOLATE MOFETIL 500 MG TABLET: ORAL | 30 days supply | Qty: 180 | Fill #0

## 2021-03-08 NOTE — Unmapped (Signed)
Complex Case Management  SUMMARY NOTE    Complex Case Manager spoke with patient and verified correct patient using two identifiers today to introduce the Complex Case Management program.     Discussed the following:  Program Services    Program status: Declined 03/08/2021

## 2021-03-14 NOTE — Unmapped (Signed)
Miguel Sharp  60 y.o. 01/31/1961  Phone: [phone] (home) [phone] (work)  Address: [address]  [place] [address]   MRN: [id number]  Primary MD : Garner Nash   [facility]     Problem List Items Addressed This Visit        Other    Encounter for colonoscopy in patient with family history of colon cancer - Primary     The patient will be scheduled for a colonoscopy as an outpatient under anesthesia. The risks and benefits have been discussed with the patient and they understand, agree and wish to proceed. The patient will return to the office to discuss any pathologic findings. Otherwise, they will be given a printout of their results at the time of the procedure. The procedure was discussed with the patient including but not limited to bleeding, perforation, infection, missed lesions, splenic or liver injury, inability to complete the procedure and anesthetic risks including MI, CVA and or death. The prep was explained including the need to remain NPO prior to the procedure.  All questions were answered. Anticoagulants will be held prior to procedure.    Endoscopy Consult Note    Past Medical History:   Diagnosis Date   ??? Diabetes mellitus (CMS-HCC)    ??? Lupus (CMS-HCC)      No past surgical history on file.  No Known Allergies  Meds:    Current Outpatient Medications:   ???  amLODIPine (NORVASC) 10 MG tablet, Take 10 mg by mouth daily., Disp: , Rfl:   ???  ELIQUIS 5 mg Tab, Take 5 mg by mouth Two (2) times a day., Disp: , Rfl:   ???  furosemide (LASIX) 80 MG tablet, Take 80 mg by mouth Two (2) times a day., Disp: , Rfl:   ???  hydrOXYchloroQUINE (PLAQUENIL) 200 mg tablet, Take 200 mg by mouth daily., Disp: , Rfl:   ???  mycophenolate (CELLCEPT) 500 mg tablet, Take 3 tablets (1,500 mg total) by mouth two (2) times a day., Disp: 540 tablet, Rfl: 3  SocHx:   reports that he has never smoked. He has never used smokeless tobacco. He reports current alcohol use. He reports that he does not use drugs.  FamHx:  He indicated that his mother is deceased.    Review of Systems  ROS All systems reviewed and negative    Special Needs:  No special needs identified History of Present Illness:  Miguel Sharp is a 60 y.o. male here for evaluation for Colonoscopy. He presents with no prior history of colonoscopy.       He denies nausea, vomiting, abdominal pain, anemia, change in bowel habits or unintentional weight loss.     He denies any blood in the toilet bowl, in the stool and on the toilet paper.    His mother had colon cancer, he thinks, in her 34s. He has no family history of inflammatory bowel disease or polyposis syndromes.      Reasons for testing: Family History of Colon Cancer    Current symptoms: none     Vitals:    03/15/21 0924   BP: 115/77   Pulse: 73   Temp: 36.7 ??C (98 ??F)     Physical Exam   Constitutional: He is oriented to person, place, and time. He appears well-developed and well-nourished. No distress.     HENT:   Head: Normocephalic and atraumatic.   Nose: Wearing a mask  Mouth/Throat: Wearing a mask  Eyes: Pupils are equal, round, and reactive to light. Conjunctivae are normal.   Neck: No JVD present. No tracheal deviation present. No thyromegaly present.     Cardiovascular: Normal rate, regular rhythm and normal heart sounds.  Exam reveals no gallop and no friction rub.    No murmur heard.    Pulmonary/Chest: Effort normal and breath sounds normal. No stridor. He has no wheezes. He has no rales.  He exhibits no tenderness.     Abdominal: Soft. Bowel sounds are normal. He exhibits no distension and no mass. There is no tenderness. There is no rebound and no guarding.     Musculoskeletal: Normal range of motion. He exhibits no tenderness or deformity.     Lymphadenopathy:   He has no cervical adenopathy.     Neurological: He is alert and oriented to person, place, and time. No cranial nerve deficit. He exhibits normal muscle tone. Coordination normal.     Skin: Skin is warm and dry. No rash noted. He is not diaphoretic. No erythema. No pallor.     Psychiatric: He has a normal mood and affect. His behavior is normal. Judgment and thought content normal.

## 2021-03-15 ENCOUNTER — Ambulatory Visit: Admit: 2021-03-15 | Discharge: 2021-03-16 | Payer: PRIVATE HEALTH INSURANCE | Attending: Surgery | Primary: Surgery

## 2021-03-15 DIAGNOSIS — Z800 Family history of malignant neoplasm of digestive organs: Principal | ICD-10-CM

## 2021-03-15 DIAGNOSIS — Z1211 Encounter for screening for malignant neoplasm of colon: Principal | ICD-10-CM

## 2021-03-15 NOTE — Unmapped (Signed)
[facility] Refill Coordination Note    Specialty Medication(s) to be Shipped:   Transplant: mycophenolate mofetil 500mg     Other medication(s) to be shipped: No additional medications requested for fill at this time     Miguel Sharp, DOB: 13-Feb-1961  Phone: [phone] (home) [phone] (work)      All above HIPAA information was verified with patient.     Was a Nurse, learning disability used for this call? No    Completed refill call assessment today to schedule patient's medication shipment from the [facility] [phone]).  All relevant notes have been reviewed.     Specialty medication(s) and dose(s) confirmed: Regimen is correct and unchanged.   Changes to medications: Miguel Sharp reports starting the following medications: furosemide as needed  Changes to insurance: No  New side effects reported not previously addressed with a pharmacist or physician: None reported  Questions for the pharmacist: No    Confirmed patient received a Conservation officer, historic buildings and a Surveyor, mining with first shipment. The patient will receive a drug information handout for each medication shipped and additional FDA Medication Guides as required.       DISEASE/MEDICATION-SPECIFIC INFORMATION        N/A    SPECIALTY MEDICATION ADHERENCE     Medication Adherence    Patient reported X missed doses in the last month: 0  Specialty Medication: Mycophenolate 500mg   Patient is on additional specialty medications: No  Patient is on more than two specialty medications: No  Any gaps in refill history greater than 2 weeks in the last 3 months: no  Demonstrates understanding of importance of adherence: yes  Informant: patient  Reliability of informant: reliable  Provider-estimated medication adherence level: good  Patient is at risk for Non-Adherence: No  Reasons for non-adherence: no problems identified  Confirmed plan for next specialty medication refill: delivery by pharmacy  Refills needed for supportive medications: not needed          Refill Coordination    Has the Patients' Contact Information Changed: No  Is the Shipping Address Different: No         Were doses missed due to medication being on hold? No    mycophenolate 500 mg: 11 days of medicine on hand         REFERRAL TO PHARMACIST     Referral to the pharmacist: Not needed      [facility]     Shipping address confirmed in Epic.     Delivery Scheduled: Yes, Expected medication delivery date: 12/14.     Medication will be delivered via UPS to the prescription address in Epic WAM.    Antonietta Barcelona   [facility] Specialty Technician

## 2021-03-22 MED FILL — MYCOPHENOLATE MOFETIL 500 MG TABLET: ORAL | 30 days supply | Qty: 180 | Fill #1

## 2021-03-23 NOTE — Progress Notes (Signed)
Office Visit Note  Patient: Gerald Lloyd             Date of Birth: [birth year]           MRN: [id number]             PCP: Garnette Gunner, MD Referring: Garnette Gunner, MD Visit Date: 03/24/2021   Subjective:  Follow-up (Doing good)   History of Present Illness: Gerald Lloyd is a 60 y.o. male here for follow up for systemic lupus with class IV/V nephritis now on HCQ 400 mg daily, cellcept 1500 mg BID, and prednisone 5 mg daily. Most recent follow up with Dr. Marisue Humble with continued proteinuria partially improving.  He continues having mild bilateral leg swelling no other symptom complaints.  Previous HPI 12/22/20 Gerald Lloyd is a 60 y.o. male with history of lower extremity DVT on eliquis anticoagulation here for new diagnosis systemic lupus.  In February he was evaluated for unilateral left leg swelling with ultrasound exam finding a DVT in the left popliteal vein unprovoked.  There was ongoing local swelling of the left calf for several months.  He then presented to the hospital due to shortness of breath.  Work-up identified bilateral pleural effusions, large circumferential pericardial effusion with tamponade and volume overload, lupus nephritis. He was started with pulse dose steroids, cellcept, and hydroxychloroquine.  And headache very good improvement after starting the high-dose steroids.  He has followed up for this with Dr. Marisue Humble at [facility] on induction therapy for lupus nephritis.  He currently feels that most of his symptoms are much improved he does have a generalized weakness and decreased exertion tolerance ever since his hospital stay. He denies any particular skin rashes, alopecia, arthritis, oral ulcers, or Raynaud's symptoms.  He does not know of any family history of autoimmune disease.  He has never experienced similar symptoms prior to these events.   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  HENT:  Negative for mouth dryness.   Eyes:  Negative for dryness.  Respiratory:  Negative for shortness of breath.   Cardiovascular:  Negative for swelling in legs/feet.  Gastrointestinal:  Negative for constipation.  Endocrine: Negative for excessive thirst.  Genitourinary:  Negative for difficulty urinating.  Musculoskeletal:  Positive for muscle weakness.  Skin:  Negative for rash.  Allergic/Immunologic: Negative for susceptible to infections.  Neurological:  Negative for numbness.  Hematological:  Negative for bruising/bleeding tendency.  Psychiatric/Behavioral:  Negative for sleep disturbance.    PMFS History:  Patient Active Problem List   Diagnosis Date Noted   High risk medication use 03/24/2021   Left leg DVT (HCC) 12/22/2020   SLE (systemic lupus erythematosus related syndrome) (HCC) 11/27/2020   Lupus nephritis (HCC) 11/27/2020   Bilateral pleural effusion 11/25/2020   AKI (acute kidney injury) (HCC) 11/24/2020   Pericardial effusion 11/24/2020   Anemia 11/24/2020   Dyspnea 11/24/2020   Essential hypertension 11/24/2020   Acute on chronic diastolic CHF (congestive heart failure) (HCC) 11/23/2020    Past Medical History:  Diagnosis Date   Anemia    DVT (deep venous thrombosis) (HCC)    Hypertension  Family History  Problem Relation Age of Onset   Breast cancer Mother    Colon cancer Mother    Diabetes Father    Healthy Son    Healthy Son    Past Surgical History:  Procedure Laterality Date   IR THORACENTESIS ASP PLEURAL SPACE W/IMG GUIDE  11/25/2020   IR THORACENTESIS ASP PLEURAL SPACE W/IMG GUIDE  11/26/2020   Social  History   Social History Narrative   Not on file   Immunization History  Administered Date(s) Administered   Moderna Sars-Covid-2 Vaccination 07/30/2019, 12/10/2019, 03/25/2020     Objective: Vital Signs: BP 109/66 (BP Location: Left Arm, Patient Position: Sitting, Cuff Size: Normal)    Pulse 76    Resp 15    Ht 5\' 10"  (1.778 m)    Wt 174 lb (78.9 kg)    BMI 24.97 kg/m    Physical Exam Eyes:     Conjunctiva/sclera: Conjunctivae normal.  Cardiovascular:     Rate and Rhythm: Normal rate and regular rhythm.  Pulmonary:     Effort: Pulmonary effort is normal.     Breath sounds: Normal breath sounds.  Musculoskeletal:     Comments: Bilateral pitting edema below the knees no overlying skin changes  Skin:    General: Skin is warm and dry.     Findings: No rash.  Neurological:     Mental Status: He is alert.     Musculoskeletal Exam:  Shoulders full ROM no tenderness or swelling Elbows full ROM no tenderness or swelling Wrists full ROM no tenderness or swelling Fingers full ROM no tenderness or swellingtenderness to lateral hip palpation Knees full ROM no tenderness or swelling   Investigation: No additional findings.  Imaging: No results found.  Recent Labs: Lab Results  Component Value Date   WBC 9.3 11/30/2020   HGB 8.9 (L) 11/30/2020   PLT 363 11/30/2020   NA 136 11/29/2020   K 3.9 11/29/2020   CL 107 11/29/2020   CO2 23 11/29/2020   GLUCOSE 98 11/29/2020   BUN 23 (H) 11/29/2020   CREATININE 1.27 (H) 11/29/2020   BILITOT 0.4 11/26/2020   ALKPHOS 61 11/26/2020   AST 24 11/26/2020   ALT 19 11/26/2020   PROT 4.6 (L) 11/26/2020   ALBUMIN 1.4 (L) 11/29/2020   CALCIUM 7.7 (L) 11/29/2020    Speciality Comments: No specialty comments available.  Procedures:  No procedures performed Allergies: Patient has no known allergies.   Assessment / Plan:     Visit Diagnoses: SLE (systemic lupus erythematosus related syndrome) ([place]) - Plan: Anti-DNA antibody,  double-stranded, C3 and C4  No current signs of extrarenal disease activity on exam or history with the current immunosuppression.  He is continuing on MMF 1500 mg twice daily hydroxychloroquine 400 mg daily and prednisone 5 mg daily.  We discussed starting Benlysta [place] at lupus nephritis induction dosing.  Rechecking lab monitoring today.  Lupus nephritis (HCC)  Partial improvement in proteinuria still has low albumin significant edema and ongoing proteinuria partial response so far.  Discussed plan with Dr. Joelyn Oms by phone earlier this week, preference to start Benlysta over calcineurin inhibitor.  Starting [facility] as above.  High risk medication use - Plan: QuantiFERON-TB Gold Plus, IgG, IgA, IgM  Discussed risks of additional immunosuppression including drug reaction injection site reactions infection risk on long-term increased malignancy risk.  Baseline labs reviewed negative for hepatitis and HIV will check TB and serum immunoglobulin levels.   Orders: Orders Placed This Encounter  Procedures  QuantiFERON-TB Gold Plus   IgG, IgA, IgM   Anti-DNA antibody, double-stranded   C3 and C4    No orders of the defined types were placed in this encounter.    Follow-Up Instructions: Return in about 3 months (around 06/22/2021) for SLE w/ Nephritis benlysta start f/u 15mos.   Collier Salina, MD  Note - This record has been created using [employer].  Chart creation errors have been sought, but may not always  have been located. Such creation errors do not reflect on  the standard of medical care.

## 2021-03-24 ENCOUNTER — Telehealth: Payer: Self-pay | Admitting: Pharmacist

## 2021-03-24 ENCOUNTER — Encounter: Payer: Self-pay | Admitting: Internal Medicine

## 2021-03-24 ENCOUNTER — Other Ambulatory Visit: Payer: Self-pay

## 2021-03-24 ENCOUNTER — Ambulatory Visit: Payer: Commercial Managed Care - PPO | Admitting: Internal Medicine

## 2021-03-24 VITALS — BP 109/66 | HR 76 | Resp 15 | Ht 70.0 in | Wt 174.0 lb

## 2021-03-24 DIAGNOSIS — Z79899 Other long term (current) drug therapy: Secondary | ICD-10-CM

## 2021-03-24 DIAGNOSIS — M3214 Glomerular disease in systemic lupus erythematosus: Secondary | ICD-10-CM | POA: Diagnosis not present

## 2021-03-24 DIAGNOSIS — M329 Systemic lupus erythematosus, unspecified: Secondary | ICD-10-CM

## 2021-03-24 NOTE — Telephone Encounter (Signed)
Initiated a Prior Authorization request to  [employer]  for [employer] via CoverMyMeds. Will update once we receive a response.   Key: [id number]  Will await completion of OV notes from today before submission.

## 2021-03-24 NOTE — Progress Notes (Signed)
Pharmacy Note Subjective: Patient presents today to [facility] for follow up office visit. 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  Previous medications include: mycophenolate, hydroxychloroquine  History of depression: No  Objective: CMP     Component Value Date/Time   NA 136 11/29/2020 0432   K 3.9 11/29/2020 0432   CL 107 11/29/2020 0432   CO2 23 11/29/2020 0432   GLUCOSE 98 11/29/2020 0432   BUN 23 (H) 11/29/2020 0432   CREATININE 1.27 (H) 11/29/2020 0432   CALCIUM 7.7 (L) 11/29/2020 0432   PROT 4.6 (L) 11/26/2020 1221   ALBUMIN 1.4 (L) 11/29/2020 0432   AST 24 11/26/2020 1221   ALT 19 11/26/2020 1221   ALKPHOS 61 11/26/2020 1221   CBC    Component Value Date/Time   WBC 9.3 11/30/2020 0305   RBC 3.16 (L) 11/30/2020 0305   HGB 8.9 (L) 11/30/2020 0305   HCT 26.6 (L) 11/30/2020 0305   PLT 363 11/30/2020 0305   MCV 84.2 11/30/2020 0305   MCH 28.2 11/30/2020 0305   MCHC 33.5 11/30/2020 0305   RDW 14.7 11/30/2020 0305   LYMPHSABS 0.7 11/27/2020 1450   MONOABS 0.8 11/27/2020 1450   EOSABS 0.1 11/27/2020 1450   BASOSABS 0.0 11/27/2020 1450   Baseline Immunosuppressant Therapy Labs  Hepatitis Latest Ref Rng & Units 11/25/2020  Hep B Surface Ag NON REACTIVE NON REACTIVE  Hep B IgM NON REACTIVE NON REACTIVE  Hep C Ab NON REACTIVE NON REACTIVE  Hep A IgM NON REACTIVE NON REACTIVE   Lab Results  Component Value Date   HIV Non Reactive 11/25/2020   HIV Non Reactive 11/24/2020   Serum Protein Electrophoresis Latest Ref Rng & Units 11/26/2020  Total Protein 6.5 - 8.1 g/dL 4.6(L)  Albumin 2.9 - 4.4 g/dL -  Alpha-1 0.0 - 0.4 g/dL -  Alpha-2 0.4 - 1.0 g/dL -  Beta Globulin 0.7 - 1.3 g/dL -  Gamma Globulin 0.4 - 1.8 g/dL -  Interpretation - -   Chest x-ray: 11/26/20 - Residual trace bilateral pleural effusions and bibasilar atelectasis. No pneumothorax.  Assessment/Plan:   Counseled patient that Benlysta is a B  cell inhibitor.  Counseled patient on purpose, proper use, and adverse effects of Benlysta.  Reviewed the most common adverse effects including infections, headache, nausea/diarrhea and injection site reactions. Discussed that there is the possibility of an increased risk of malignancy but it is not well understood if this increased risk is due to the medication or the disease state.  Counseled patient that Benlysta should be held prior to scheduled surgery.  Counseled patient to avoid live vaccines while on Benlysta. Recommend annual influenza, Pneumovax 23, Prevnar 13, and Shingrix as indicated.     Reviewed the importance of regular labs while on Benlysta therapy.  Advised patient to get standing labs one month after starting Benlysta then every 3 months.  Provided patient with standing lab orders. Provided patient with medication education material and answered all questions.  Patient voiced understanding.  Patient consented to [facility]  Will upload consent into the media tab.  Reviewed storage instructions of Benlysta.  Advised initial injection must be administered in office.  Patient voiced understanding.   Benlysta dose will be 400mg  SQ x 4 weeks, then 200mg  SQ once weekly.  Prescription pending lab results and/or insurance approval. Patient has Pharmacist, community and will be eligible for copay card  Orders Placed This Encounter  Procedures   QuantiFERON-TB Gold  Plus   IgG, IgA, IgM   Anti-DNA antibody, double-stranded   C3 and C4   Chesley Mires, PharmD, MPH, BCPS Clinical Pharmacist (Rheumatology and Pulmonology)

## 2021-03-24 NOTE — Telephone Encounter (Signed)
Please start Benlysta SQ BIV.  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

## 2021-03-29 LAB — ANTI-DNA ANTIBODY, DOUBLE-STRANDED: ds DNA Ab: 226 IU/mL — ABNORMAL HIGH

## 2021-03-29 LAB — QUANTIFERON-TB GOLD PLUS
Mitogen-NIL: 1.26 IU/mL
NIL: 0.05 IU/mL
QuantiFERON-TB Gold Plus: NEGATIVE
TB1-NIL: 0.02 IU/mL
TB2-NIL: 0 IU/mL

## 2021-03-29 LAB — C3 AND C4
C3 Complement: 111 mg/dL (ref 82–185)
C4 Complement: 31 mg/dL (ref 15–53)

## 2021-03-29 LAB — IGG, IGA, IGM
IgG (Immunoglobin G), Serum: 655 mg/dL (ref 600–1640)
IgM, Serum: 11 mg/dL — ABNORMAL LOW (ref 50–300)
Immunoglobulin A: 146 mg/dL (ref 47–310)

## 2021-03-29 NOTE — Progress Notes (Signed)
Lab results look okay for the [facility] is working on this already. Complement level is now normal and the dsDNa is decreased to 226 from >300 (very high) consistent with partial response to treatment so far.

## 2021-03-30 NOTE — Telephone Encounter (Signed)
Submitted a Prior Authorization request to  [facility]  for [employer] via CoverMyMeds. Will update once we receive a response.  Key: [id number]  Chesley Mires, PharmD, MPH, BCPS Clinical Pharmacist (Rheumatology and Pulmonology)

## 2021-03-31 NOTE — Telephone Encounter (Addendum)
Received fax from [facility] for additional clinical information for patient's Gerald Lloyd.  Completed forms and faxed as URGENT request. Copy sent to scan center and into patient's media tab  Ref # [id number] Fax: [phone] Phone: [phone]  Chesley Mires, PharmD, MPH, BCPS Clinical Pharmacist (Rheumatology and Pulmonology)

## 2021-04-01 ENCOUNTER — Other Ambulatory Visit (HOSPITAL_COMMUNITY): Payer: Self-pay

## 2021-04-01 NOTE — Telephone Encounter (Signed)
Patient is scheduled for 04/14/2021 at 9:30am for Benlysta new start.

## 2021-04-01 NOTE — Telephone Encounter (Signed)
Received notification from [facility] regarding a prior authorization for [facility]. Authorization has been APPROVED from 03/30/21 to 03/31/22.   Unable to run test claim as patient must fill through [facility]  Authorization # [id number]  Enrolled patient into copay card: ID: [id number] Confirmation number: [id number] Group: [id number] BIN: [id number] PCN: [id number]  ATC patient to schedule new start but he was in meeting. He will return call  Chesley Mires, PharmD, MPH, BCPS Clinical Pharmacist (Rheumatology and Pulmonology)

## 2021-04-13 NOTE — Unmapped (Signed)
[facility] Refill Coordination Note    Specialty Medication(s) to be Shipped:   General Specialty: mycophenolate 500mg     Other medication(s) to be shipped: No additional medications requested for fill at this time     Miguel Sharp, DOB: [birth year]  Phone: [phone] (home) [phone] (work)      All above HIPAA information was verified with patient.     Was a Nurse, learning disability used for this call? No    Completed refill call assessment today to schedule patient's medication shipment from the [facility] [phone]).  All relevant notes have been reviewed.     Specialty medication(s) and dose(s) confirmed: Regimen is correct and unchanged.   Changes to medications: Miguel Sharp reports starting the following medications: benlysta  Changes to insurance: No  New side effects reported not previously addressed with a pharmacist or physician: None reported  Questions for the pharmacist: No    Confirmed patient received a Conservation officer, historic buildings and a Surveyor, mining with first shipment. The patient will receive a drug information handout for each medication shipped and additional FDA Medication Guides as required.       DISEASE/MEDICATION-SPECIFIC INFORMATION        N/A    SPECIALTY MEDICATION ADHERENCE     Medication Adherence    Patient reported X missed doses in the last month: 0  Specialty Medication: Mycophenolate 500mg   Patient is on additional specialty medications: No  Patient is on more than two specialty medications: No  Any gaps in refill history greater than 2 weeks in the last 3 months: no  Demonstrates understanding of importance of adherence: yes  Informant: patient  Reliability of informant: reliable  Provider-estimated medication adherence level: good  Patient is at risk for Non-Adherence: No  Reasons for non-adherence: no problems identified  Confirmed plan for next specialty medication refill: delivery by pharmacy  Refills needed for supportive medications: not needed          Refill Coordination    Has the Patients' Contact Information Changed: No  Is the Shipping Address Different: No         Were doses missed due to medication being on hold? No    mycophenolate 500 mg: 10 days of medicine on hand         REFERRAL TO PHARMACIST     Referral to the pharmacist: Not needed      [facility]     Shipping address confirmed in Epic.     Delivery Scheduled: Yes, Expected medication delivery date: 01/13.     Medication will be delivered via UPS to the prescription address in Epic WAM.    Miguel Sharp   [facility] Specialty Technician

## 2021-04-14 ENCOUNTER — Encounter (HOSPITAL_BASED_OUTPATIENT_CLINIC_OR_DEPARTMENT_OTHER): Payer: Self-pay

## 2021-04-14 ENCOUNTER — Other Ambulatory Visit: Payer: Self-pay

## 2021-04-14 ENCOUNTER — Other Ambulatory Visit (HOSPITAL_COMMUNITY): Payer: Self-pay

## 2021-04-14 ENCOUNTER — Ambulatory Visit: Payer: Commercial Managed Care - PPO | Admitting: Pharmacist

## 2021-04-14 ENCOUNTER — Encounter (HOSPITAL_BASED_OUTPATIENT_CLINIC_OR_DEPARTMENT_OTHER): Payer: Self-pay | Admitting: *Deleted

## 2021-04-14 VITALS — BP 116/80 | HR 73

## 2021-04-14 DIAGNOSIS — Z79899 Other long term (current) drug therapy: Secondary | ICD-10-CM

## 2021-04-14 DIAGNOSIS — M3214 Glomerular disease in systemic lupus erythematosus: Secondary | ICD-10-CM

## 2021-04-14 DIAGNOSIS — Z7189 Other specified counseling: Secondary | ICD-10-CM

## 2021-04-14 DIAGNOSIS — M329 Systemic lupus erythematosus, unspecified: Secondary | ICD-10-CM

## 2021-04-14 MED ORDER — BENLYSTA 200 MG/ML ~~LOC~~ SOAJ: 200.0000 mg | SUBCUTANEOUS | 1 refills | Status: DC

## 2021-04-14 MED ORDER — BENLYSTA 200 MG/ML ~~LOC~~ SOAJ: SUBCUTANEOUS | 0 refills | Status: DC

## 2021-04-14 MED ORDER — BENLYSTA 200 MG/ML SUBCUTANEOUS AUTO-INJECTOR
SUBCUTANEOUS | 0 refills | 0.00000 days
Start: 2021-04-14 — End: ?

## 2021-04-14 NOTE — Progress Notes (Signed)
Pharmacy Note  Subjective:   Patient presents to clinic today to receive first dose of Benlysta for SLE and lupus nephritis. He takes hydroxychloroquine 200 mg daily and mycophenolate 1500 mg twice daily.  Patient running a fever or have signs/symptoms of infection? No  Patient currently on antibiotics for the treatment of infection? No  Patient have any upcoming invasive procedures/surgeries? No  Objective: CMP     Component Value Date/Time   NA 136 11/29/2020 0432   K 3.9 11/29/2020 0432   CL 107 11/29/2020 0432   CO2 23 11/29/2020 0432   GLUCOSE 98 11/29/2020 0432   BUN 23 (H) 11/29/2020 0432   CREATININE 1.27 (H) 11/29/2020 0432   CALCIUM 7.7 (L) 11/29/2020 0432   PROT 4.6 (L) 11/26/2020 1221   ALBUMIN 1.4 (L) 11/29/2020 0432   AST 24 11/26/2020 1221   ALT 19 11/26/2020 1221   ALKPHOS 61 11/26/2020 1221   BILITOT 0.4 11/26/2020 1221   GFRNONAA >60 11/29/2020 0432    CBC    Component Value Date/Time   WBC 9.3 11/30/2020 0305   RBC 3.16 (L) 11/30/2020 0305   HGB 8.9 (L) 11/30/2020 0305   HCT 26.6 (L) 11/30/2020 0305   PLT 363 11/30/2020 0305   MCV 84.2 11/30/2020 0305   MCH 28.2 11/30/2020 0305   MCHC 33.5 11/30/2020 0305   RDW 14.7 11/30/2020 0305   LYMPHSABS 0.7 11/27/2020 1450   MONOABS 0.8 11/27/2020 1450   EOSABS 0.1 11/27/2020 1450   BASOSABS 0.0 11/27/2020 1450    Baseline Immunosuppressant Therapy Labs TB GOLD Quantiferon TB Gold Latest Ref Rng & Units 03/24/2021  Quantiferon TB Gold Plus NEGATIVE NEGATIVE   Hepatitis Panel Hepatitis Latest Ref Rng & Units 11/25/2020  Hep B Surface Ag NON REACTIVE NON REACTIVE  Hep B IgM NON REACTIVE NON REACTIVE  Hep C Ab NON REACTIVE NON REACTIVE  Hep A IgM NON REACTIVE NON REACTIVE   HIV Lab Results  Component Value Date   HIV Non Reactive 11/25/2020   HIV Non Reactive 11/24/2020   Immunoglobulins Immunoglobulin Electrophoresis Latest Ref Rng & Units 03/24/2021  IgA  47 - 310 mg/dL 146  IgG 600 -  1,640 mg/dL 655  IgM 50 - 300 mg/dL 11(L)   SPEP Serum Protein Electrophoresis Latest Ref Rng & Units 11/26/2020  Total Protein 6.5 - 8.1 g/dL 4.6(L)  Albumin 2.9 - 4.4 g/dL -  Alpha-1 0.0 - 0.4 g/dL -  Alpha-2 0.4 - 1.0 g/dL -  Beta Globulin 0.7 - 1.3 g/dL -  Gamma Globulin 0.4 - 1.8 g/dL -  Interpretation - -   Chest x-ray: 11/26/20 - Residual trace bilateral pleural effusions and bibasilar atelectasis. No pneumothorax.  Assessment/Plan:  Demonstrated proper injection technique with Benlysta demo device  Patient able to demonstrate proper injection technique using the teach back method.  Patient self injected in the right lower abdomen and left lower abdomen with:  Sample Medication: Benlysta 200mg /mL autoinjector x 2 pens NDC: [id number]: [id number] Expiration: 02/2022  Patient tolerated well.  Observed for 30 mins in office for adverse reaction and none noted.   Patient is to return in 1 month for labs and 6-8 weeks for follow-up appointment.  Standing orders placed.   Benlysta approved through insurance .   Rx sent to: [facility] [phone]).  Patient provided with pharmacy phone number and advised to call later this week to schedule shipment to home.  His dose of Benylsta is 400mg  SQ weekly  x 4 weeks (first week received today in clinic), then 200mg  SQ once weekly thereafter as maintenance. He takes Bactrim DS on Monday, Wednesday, Friday for PJP prophylaxis while on mycophenolate.  He will continue hydroxychloroquine 200mg  daily and mycophenolate 1500mg  twice daily All questions encouraged and answered.  Instructed patient to call with any further questions or concerns.  Knox Saliva, PharmD, MPH, BCPS Clinical Pharmacist (Rheumatology and Pulmonology)  04/14/2021 8:46 AM

## 2021-04-14 NOTE — Patient Instructions (Addendum)
Your next  BENLYSTA  dose is due: - 2 injections on 04/21/21 - 2 injections on 04/28/21 - 2 injections on 05/05/21 - ONE injection on 05/12/21 and every 7 days thereafter  CONTINUE mycophenolate (CELLCEPT) and hydroxychloroquine (PLAQUENIL) as prescribed  HOLD BENLYSTA if you have signs or symptoms of an infection. You can resume once you feel better or back to your baseline. HOLD BENLYSTA if you start antibiotics to treat an infection. HOLD BENLYSTA around the time of surgery/procedures. Your surgeon will be able to provide recommendations on when to hold BEFORE and when you are cleared to [place].  Pharmacy information: Your prescription will be shipped from [facility]. Their phone number is [phone] Please call to schedule shipment and confirm address. They will mail your medication to your home.  Cost information: Your copay should be affordable. If you call the pharmacy and it is not affordable, please double-check that they are billing through your copay card as secondary coverage. That copay card information is: ID: [id number] Group: [id number] BIN: [id number] PCN: [id number]  How to manage an injection site reaction: Remember the 5 C's: COUNTER - leave on the counter at least 30 minutes but up to overnight to bring medication to room temperature. This may help prevent stinging COLD - place something cold (like an ice gel pack or cold water bottle) on the injection site just before cleansing with alcohol. This may help reduce pain CLARITIN - use Claritin (generic name is loratadine) for the first two weeks of treatment or the day of, the day before, and the day after injecting. This will help to minimize injection site reactions CORTISONE CREAM - apply if injection site is irritated and itching CALL ME - if injection site reaction is bigger than the size of your fist, looks infected, blisters, or if you develop hives

## 2021-04-15 NOTE — Unmapped (Signed)
[facility] Specialty Medication Onboarding    Specialty Medication: Benlysta 200mg /ml atin  Prior Authorization: Approved   Financial Assistance: Yes - copay card approved as secondary   Final Copay/Day Supply: $0 / 28 days (load)                                            $0 / 28 days (maintenance)    Insurance Restrictions: Yes - max 1 month supply     Notes to Pharmacist:     The triage team has completed the benefits investigation and has determined that the patient is able to fill this medication at [facility]. Please contact the patient to complete the onboarding or follow up with the prescribing physician as needed.

## 2021-04-18 ENCOUNTER — Ambulatory Visit (HOSPITAL_BASED_OUTPATIENT_CLINIC_OR_DEPARTMENT_OTHER): Payer: Commercial Managed Care - PPO | Admitting: Cardiology

## 2021-04-18 MED ORDER — EMPTY CONTAINER
2 refills | 0 days
Start: 2021-04-18 — End: ?

## 2021-04-18 NOTE — Unmapped (Signed)
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  I am speaking to the patient.    Was a Nurse, learning disability used for this call? No    Verified patient's date of birth / HIPAA.    Specialty medication(s) to be sent: Inflammatory Disorders: Benlysta      Non-specialty medications/supplies to be sent: sharps container       Medications not needed at this time: n/a         Benlysta?? (belimumab)    Medication & Administration     Dosage: SLE/lupus nephritis:  400 mg once weekly for 4 doses, then 200 mg once weekly thereafter .  First dose received in clinic on 04/14/20, next due 1/12      Administration:     Prefilled syringe  1. Gather all supplies needed for injection on a clean, flat working surface: medication syringe removed from packaging, alcohol swab, sharps container, etc.  2. Look at the medication label - look for correct medication, correct dose, and check the expiration date  3. Look at the medication - the liquid in the syringe should appear clear and colorless to slightly yellow  4. Lay the syringe on a flat surface and allow it to warm up to room temperature for at least 30 minutes  5. Select injection site - you can use the front of your thigh or your belly (but not the area 2 inches around your belly button)  6. Prepare injection site - wash your hands and clean the skin at the injection site with an alcohol swab and let it air dry, do not touch the injection site again before the injection  7. Pull off the needle safety cap, do not remove until immediately prior to injection  8. Pinch the skin - with your hand not holding the syringe pinch up a fold of skin at the injection site using your forefinger and thumb  9. Insert the needle into the fold of skin at about a 45 degree angle - it's best to use a quick dart-like motion - with the syringe in position, release the pinch of skin and allow the skin to relax  10. Push the plunger down slowly as far as it will go until the syringe is empty, if the plunger is not fully depressed the needle shield will not extend to cover the needle when it is removed  11. Check that the syringe is empty and keep pressing down on the plunger while you pull the needle out at the same angle as inserted; after the needle is removed completely from the skin, release the plunger allowing the needle shield to activate and cover the used needle  12. Dispose of the used syringe immediately in your sharps disposal container  13. If you see any blood at the injection site, press a cotton ball or gauze on the site and maintain pressure until the bleeding stops, do not rub the injection site      Prefilled auto-injector pen  1. Gather all supplies needed for injection on a clean, flat working surface: medication pen removed from packaging, alcohol swab, sharps container, etc.  2. Look at the medication label - look for correct medication, correct dose, and check the expiration date  3. Look at the medication - the liquid visible in the window on the side of the pen device should appear clear  and colorless to slightly yellow  4. Lay the auto-injector pen on a flat surface and allow it to warm up to room temperature for at least 30 minutes  5. Select injection site - you can use the front of your thigh or your belly (but not the area 2 inches around your belly button)  6. Prepare injection site - wash your hands and clean the skin at the injection site with an alcohol swab and let it air dry, do not touch the injection site again before the injection  7. Pull/twist off the ring safety cap, do not remove until immediately prior to injection and do not touch the needle shield  8. Pinch the skin - with your hand not holding the auto-injector pinch up a fold of skin at the injection site using your forefinger and thumb to prepare a firm injection site  9. Put the gold needle shield against your skin at the injection site at a 90 degree angle, hold the pen such that you can see the clear medication window  10. To initiate the injection firmly press the autoinjector all the way down onto the injection site and hold in place - you will hear a click sound to indicate the injection has started  11. Continue to hold the auto injector firmly against your skin for 15 seconds - you will see the purple injection indicator start to fill in the viewing window  12. There will be a second click sound when the injection is almost complete, verify the window is solid purple before pulling the pen away from your skin  13. Dispose of the used auto-injector pen immediately in your sharps disposal container the needle will be covered automatically  14. If you see any blood at the injection site, press a cotton ball or gauze on the site and maintain pressure until the bleeding stops, do not rub the injection site      Adherence/Missed dose instructions:  Take your missed dose as soon as you remember it.  After taking a missed dose, start a new weekly schedule based on the day the dose is taken - consult your pharmacist for additional instructions on how to adjust your dosing schedule if you have questions.    Goals of Therapy     ??? Ensure long-term survival  ??? Achieve the lowest possible disease activity  ??? Prevent organ damage  ??? Improve quality of life  ??? Minimize drug toxicity    Side Effects & Monitoring Parameters     ??? Injection site reaction (redness, irritation, inflammation localized to the site of administration)  ??? Signs of a common cold - minor sore throat, runny or stuffy nose, etc.  ??? Headache  ??? Diarrhea      The following side effects should be reported to the provider:  ??? Signs of a hypersensitivity reaction - rash; hives; itching; red, swollen, blistered, or peeling skin; wheezing; tightness in the chest or throat; difficulty breathing, swallowing, or talking; swelling of the mouth, face, lips, tongue, or throat; etc.  ??? Reduced immune function - report signs of infection such as fever; chills; body aches; very bad sore throat; ear or sinus pain; cough; more sputum or change in color of sputum; pain with passing urine; wound that will not heal, etc.  Also at a slightly higher risk of some malignancies (mainly skin and blood cancers) due to this reduced immune function.  o In the case of signs of infection - the patient should hold  the next dose of Benlysta?? and call your primary care provider to ensure adequate medical care.  Treatment may be resumed when infection is treated and patient is asymptomatic.  ??? New or worse behavior or mood changes such as depression or suicidality  ??? Chest pain or pressure  ??? Shortness of breath  ??? Signs of PML - confusion, memory problems, trouble speaking or thinking, change in balance, etc.      Warnings, Precautions, & Contraindications     ??? Have your bloodwork checked as you have been told by your prescriber   ??? Talk with your doctor if you are pregnant, planning to become pregnant, or breastfeeding  ??? Discuss the possible need for holding your dose(s) of Benlysta?? when a planned procedure is scheduled with the prescriber as it may delay healing/recovery timeline       Drug/Food Interactions     ??? Medication list reviewed in Epic. The patient was instructed to inform the care team before taking any new medications or supplements. No drug interactions identified.   ??? Talk with you prescriber or pharmacist before receiving any live vaccinations while taking this medication and after you stop taking it    Storage, Handling Precautions, & Disposal     ??? Store this medication in the refrigerator.  Do not freeze  ??? If needed, you may store at room temperature for up to 12 hours  ??? Store in original packaging, protected from light  ??? Do not shake  ??? Dispose of used syringes/pens in a sharps disposal container      Current Medications (including OTC/herbals), Comorbidities and Allergies     Current Outpatient Medications   Medication Sig Dispense Refill   ??? amLODIPine (NORVASC) 10 MG tablet Take 10 mg by mouth daily.     ??? belimumab (BENLYSTA) 200 mg/mL AtIn Inject the contents of 1 pen (200 mg) under the skin once a week as maintenance. 4 mL 1   ??? belimumab (BENLYSTA) 200 mg/mL AtIn Inject the contents of 2 pens (400 mg) under the skin once a week for 3 weeks. THEN inject 1 pen ( 200mg ) under the skin every week thereafter. 6 mL 0   ??? ELIQUIS 5 mg Tab Take 5 mg by mouth Two (2) times a day.     ??? furosemide (LASIX) 80 MG tablet Take 80 mg by mouth Two (2) times a day.     ??? hydrOXYchloroQUINE (PLAQUENIL) 200 mg tablet Take 200 mg by mouth daily.     ??? mycophenolate (CELLCEPT) 500 mg tablet Take 3 tablets (1,500 mg total) by mouth two (2) times a day. 540 tablet 3     No current facility-administered medications for this visit.       No Known Allergies    Patient Active Problem List   Diagnosis   ??? Encounter for colonoscopy in patient with family history of colon cancer       Reviewed and up to date in Epic.    Appropriateness of Therapy     Acute infections noted within Epic:  No active infections  Patient reported infection: None    Is medication and dose appropriate based on diagnosis and infection status? Yes    Prescription has been clinically reviewed: Yes      Baseline Quality of Life Assessment      How many days over the past month did your SLe/lupus nephritis  keep you from your normal activities? For example, brushing your teeth or getting up in  the morning. Patient declined to answer    Financial Information     Medication Assistance provided: Prior Authorization and Copay Assistance    Anticipated copay of $0 reviewed with patient. Verified delivery address.    Delivery Information     Scheduled delivery date: 04/22/21    Expected start date: 04/21/21 but patient is out of town and will useon 1/13    Medication will be delivered via UPS to the prescription address in [facility].  This shipment will not require a signature.      Explained the services we provide at [facility] and that each month we would call to set up refills.  Stressed importance of returning phone calls so that we could ensure they receive their medications in time each month.  Informed patient that we should be setting up refills 7-10 days prior to when they will run out of medication.  A pharmacist will reach out to perform a clinical assessment periodically.  Informed patient that a welcome packet, containing information about our pharmacy and other support services, a Notice of Privacy Practices, and a drug information handout will be sent.      The patient or caregiver noted above participated in the development of this care plan and knows that they can request review of or adjustments to the care plan at any time.      Patient or caregiver verbalized understanding of the above information as well as how to contact the pharmacy at [phone] option 4 with any questions/concerns.  The pharmacy is open Monday through Friday 8:30am-4:30pm.  A pharmacist is available 24/7 via pager to answer any clinical questions they may have.    Patient Specific Needs     - Does the patient have any physical, cognitive, or cultural barriers? No    - Does the patient have adequate living arrangements? (i.e. the ability to store and take their medication appropriately) Yes    - Did you identify any home environmental safety or security hazards? No    - Patient prefers to have medications discussed with  Patient     - Is the patient or caregiver able to read and understand education materials at a high school level or above? Yes    - Patient's primary language is  English     - Is the patient high risk? No    SOCIAL DETERMINANTS OF HEALTH     At the [facility], we have learned that life circumstances - like trouble affording food, housing, utilities, or transportation can affect the health of many of our patients.   That is why we wanted to ask: are you currently experiencing any life circumstances that are negatively impacting your health and/or quality of life? Patient declined to answer    Social Determinants of Health     Food Insecurity: Not on file   Tobacco Use: Low Risk    ??? Smoking Tobacco Use: Never   ??? Smokeless Tobacco Use: Never   ??? Passive Exposure: Not on file   Transportation Needs: Not on file   Alcohol Use: Not on file   Housing/Utilities: Not on file   Substance Use: Not on file   Financial Resource Strain: Not on file   Physical Activity: Not on file   Health Literacy: Not on file   Stress: Not on file   Intimate Partner Violence: Not on file   Depression: Not on file   Social Connections: Not on file  Would you be willing to receive help with any of the needs that you have identified today? Not applicable       Julianne Rice  [facility] Specialty Pharmacist

## 2021-04-21 MED FILL — MYCOPHENOLATE MOFETIL 500 MG TABLET: ORAL | 30 days supply | Qty: 180 | Fill #2

## 2021-04-21 MED FILL — EMPTY CONTAINER: 90 days supply | Qty: 1 | Fill #0

## 2021-04-21 MED FILL — BENLYSTA 200 MG/ML SUBCUTANEOUS AUTO-INJECTOR: SUBCUTANEOUS | 30 days supply | Qty: 8 | Fill #0

## 2021-04-25 ENCOUNTER — Ambulatory Visit (HOSPITAL_BASED_OUTPATIENT_CLINIC_OR_DEPARTMENT_OTHER): Payer: Commercial Managed Care - PPO | Admitting: Cardiology

## 2021-04-25 ENCOUNTER — Other Ambulatory Visit: Payer: Self-pay

## 2021-04-25 ENCOUNTER — Encounter (HOSPITAL_BASED_OUTPATIENT_CLINIC_OR_DEPARTMENT_OTHER): Payer: Self-pay | Admitting: Cardiology

## 2021-04-25 VITALS — BP 110/78 | HR 52 | Ht 70.0 in | Wt 175.0 lb

## 2021-04-25 DIAGNOSIS — I10 Essential (primary) hypertension: Secondary | ICD-10-CM

## 2021-04-25 DIAGNOSIS — M329 Systemic lupus erythematosus, unspecified: Secondary | ICD-10-CM

## 2021-04-25 DIAGNOSIS — M3214 Glomerular disease in systemic lupus erythematosus: Secondary | ICD-10-CM | POA: Diagnosis not present

## 2021-04-25 DIAGNOSIS — Z86718 Personal history of other venous thrombosis and embolism: Secondary | ICD-10-CM

## 2021-04-25 NOTE — Patient Instructions (Signed)

## 2021-04-25 NOTE — Progress Notes (Signed)
Cardiology Office Note:    Date:  04/25/2021   ID:  Gerald Lloyd, DOB 30-Dec-1960, MRN [id number]  PCP:  Bonnita Hollow, MD  Cardiologist:  Buford Dresser, MD  Referring MD: Bonnita Hollow, MD   CC: follow up  History of Present Illness:    Gerald Lloyd is a 61 y.o. male with a hx of anemia, DVT, and hypertension, who is seen for hospital follow-up. I met him during this hospitalization in 11/2020. Reviewed hospital course, workup, and medications at length today. Working diagnosis is systemic autoimmune process (possibly lupus) resulting in systemic volume overload, pericardial and pleural effusions.  Today: Overall, he is feeling okay.   For a while, likely since the development of his autoimmune issues, he has been feeling tingling sensations at the bottom of his feet towards the end of the day. This is worsened if he is physically on his feet all day. He denies any discoloration or non-healing wounds of his feet.  While active, he is sometimes unable to continue like he would want. This causes him to rest for a moment before continuing, or to move more slowly. He states this is more related to fatigue than it is shortness of breath.  Occasionally he has issues with LE edema around his ankles, but not as severely as prior. Typically he is taking Lasix once or twice a week. On some days he notices sock lines on his LE by the end of the day.  He has had his first 2 injections of Benlysta. So far he seems to be tolerating this well.  He denies any palpitations, or chest pain. No lightheadedness, headaches, syncope, orthopnea, PND, or lower extremity edema. No hematuria or hematochezia.   Past Medical History:  Diagnosis Date   Anemia    DVT (deep venous thrombosis) ([place])    Hypertension     Past Surgical History:  Procedure Laterality Date   IR THORACENTESIS ASP PLEURAL SPACE W/IMG GUIDE  11/25/2020   IR THORACENTESIS ASP PLEURAL SPACE W/IMG GUIDE  11/26/2020     Current Medications: Current Outpatient Medications on File Prior to Visit  Medication Sig   amLODipine (NORVASC) 10 MG tablet Take 1 tablet (10 mg total) by mouth daily.   Belimumab (BENLYSTA) 200 MG/ML SOAJ Inject 42m into the skin weekly for 3 weeks, then 2072minto the skin every week thereafter. Week 0 dose received in clinic on 04/14/21   Belimumab (BENLYSTA) 200 MG/ML SOAJ Inject 200 mg into the skin once a week. MAINTENANCE DOSE   ELIQUIS 5 MG TABS tablet Take 1 tablet (5 mg total) by mouth 2 (two) times daily.   furosemide (LASIX) 80 MG tablet Take 80 mg by mouth as needed.   hydroxychloroquine (PLAQUENIL) 200 MG tablet Take 1 tablet (200 mg total) by mouth daily.   mycophenolate (CELLCEPT) 500 MG tablet Take 1,500 mg by mouth 2 (two) times daily. X 1 week, 2nd week 2 tabs in the morning and 2 tabs in evening. 3rd week 3 tabs each morning and 3 each evening.   predniSONE (DELTASONE) 5 MG tablet Take 5 mg by mouth daily.   No current facility-administered medications on file prior to visit.     Allergies:   Patient has no known allergies.   Social History   Tobacco Use   Smoking status: Never   Smokeless tobacco: Never  Vaping Use   Vaping Use: Never used  Substance Use Topics   Alcohol use: Yes    Comment: Occasionally  Drug use: Never    Family History: Father has polycythemia, stroke in his 32's Paternal Grandfather collapsed from heart attack in his 62's Mother Breast and colon cancer No siblings  ROS:   Please see the history of present illness.   (+) Tingling sensations, bottom of bilateral feet (+) Fatigue (+) Bilateral LE ankle edema Additional pertinent ROS otherwise unremarkable.  EKGs/Labs/Other Studies Reviewed:    The following studies were reviewed today:  Echo 12/24/2020: 1. Left ventricular ejection fraction, by estimation, is 60 to 65%. The  left ventricle has normal function. The left ventricle has no regional  wall motion  abnormalities. Left ventricular diastolic parameters were  normal.   2. Right ventricular systolic function is normal. The right ventricular  size is normal. There is normal pulmonary artery systolic pressure.   3. A small to moderate (1.0 cm) pericardial effusion is present. The  pericardial effusion is localized near the right atrium. There is also a  trivial collection posteriorly.   4. Mild mitral valve regurgitation.   5. The inferior vena cava is normal in size with greater than 50%  respiratory variability, suggesting right atrial pressure of 3 mmHg.   Comparison(s): Prior images reviewed side by side. There has been  significant improvement although not resolution of the pericardial  effusion.   Echo 11/24/2020: 1. Left ventricular ejection fraction, by estimation, is 55 to 60%. The  left ventricle has normal function. The left ventricle has no regional  wall motion abnormalities. Left ventricular diastolic parameters are  consistent with Grade II diastolic  dysfunction (pseudonormalization). Elevated left ventricular end-diastolic  pressure.   2. Right ventricular systolic function is normal. The right ventricular  size is normal. There is normal pulmonary artery systolic pressure.   3. Left atrial size was severely dilated.   4. Large pericardial effusion measuring up to 2.3 cm. Right atrial  collapse noted but no right ventricular collapse or significant  respiratory inflow variation. RA pressure 8 mmHg. Findings are consistent  with early tamponade. Recommend serial follow up   and clinical correlation. Large pericardial effusion. The pericardial  effusion is circumferential. There is no evidence of cardiac tamponade.  Moderate pleural effusion in the left lateral region.   5. The mitral valve is normal in structure. No evidence of mitral valve  regurgitation. No evidence of mitral stenosis.   6. The aortic valve is tricuspid. Aortic valve regurgitation is not   visualized. No aortic stenosis is present.   7. Aortic dilatation noted. There is borderline dilatation of the  ascending aorta, measuring 36 mm.   8. The inferior vena cava is dilated in size with >50% respiratory  variability, suggesting right atrial pressure of 8 mmHg.   9. There is a small patent foramen ovale.  EKG:  EKG is personally reviewed. 04/25/2021: EKG was not ordered. 12/30/2020: NSR at 88 bpm, with LVH/repol pattern   12/02/2020: sinus tachycardia at 115 bpm  Recent Labs: 11/23/2020: B Natriuretic Peptide 490.3 11/26/2020: ALT 19 11/27/2020: Magnesium 1.8 11/29/2020: BUN 23; Creatinine, Ser 1.27; Potassium 3.9; Sodium 136 11/30/2020: Hemoglobin 8.9; Platelets 363   Recent Lipid Panel    Component Value Date/Time   CHOL 201 (H) 11/25/2020 0909   TRIG 140 11/25/2020 0909   HDL 22 (L) 11/25/2020 0909   CHOLHDL 9.1 11/25/2020 0909   VLDL 28 11/25/2020 0909   LDLCALC 151 (H) 11/25/2020 0909    Physical Exam:    VS:  BP 110/78 (BP Location: Right Arm, Patient Position: Sitting,  Cuff Size: Normal)    Pulse (!) 52    Ht 5' 10" (1.778 m)    Wt 175 lb (79.4 kg)    SpO2 99%    BMI 25.11 kg/m     Wt Readings from Last 3 Encounters:  04/25/21 175 lb (79.4 kg)  03/24/21 174 lb (78.9 kg)  12/30/20 173 lb 9.6 oz (78.7 kg)    GEN: Well nourished, well developed in no acute distress HEENT: Normal, moist mucous membranes NECK: No JVD CARDIAC: regular rhythm, normal S1 and S2, no rubs or gallops. No murmur. VASCULAR: Radial and DP pulses 2+ bilaterally. No carotid bruits RESPIRATORY:  Clear to auscultation without rales, wheezing or rhonchi  ABDOMEN: Soft, non-tender, non-distended MUSCULOSKELETAL:  Ambulates independently SKIN: Warm and dry, no edema NEUROLOGIC:  Alert and oriented x 3. No focal neuro deficits noted. PSYCHIATRIC:  Normal affect    ASSESSMENT:    1. 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 Essential  hypertension   4. History of DVT (deep vein thrombosis)     PLAN:    Lupus, with lupus nephritis Initial presentation with: Anemia, Anasarca/hypoalbuminemia, Acute kidney injury, Pleural effusion, Pericardial effusion -overall he is improving with treatment of his lupus -no evidence of clinical tamponade -discussed red flag warning signs that need immediate medical attention -follows closely with Dr. Joelyn Oms at [facility]  DVT: on anticoagulation  Hypertension: -continue amlodipine given renal issues, monitor edema -BP at goal today, if continues to improve may be able to cut back or stop amlodipine  Plan for follow up: 6 months or sooner as needed.   Medication Adjustments/Labs and Tests Ordered: Current medicines are reviewed at length with the patient today.  Concerns regarding medicines are outlined above.   No orders of the defined types were placed in this encounter.  No orders of the defined types were placed in this encounter.  Patient Instructions  Medication Instructions:  Your Physician recommend you continue on your current medication as directed.    *If you need a refill on your cardiac medications before your next appointment, please call your pharmacy*   Lab Work: None ordered today   Testing/Procedures: None ordered today   Follow-Up: At [facility], you and your health needs are our priority.  As part of our continuing mission to provide you with exceptional heart care, we have created designated Provider Care Teams.  These Care Teams include your primary Cardiologist (physician) and Advanced Practice Providers (APPs -  Physician Assistants and Nurse Practitioners) who all work together to provide you with the care you need, when you need it.  We recommend signing up for the patient portal called "MyChart".  Sign up information is provided on this After Visit Summary.  MyChart is used to connect with patients for Virtual Visits (Telemedicine).   Patients are able to view lab/test results, encounter notes, upcoming appointments, etc.  Non-urgent messages can be sent to your provider as well.   To learn more about what you can do with MyChart, go to [email or web address].    Your next appointment:   6 month(s)  The format for your next appointment:   In Person  Provider:   Buford Dresser, MD       [facility] Stumpf,acting as a scribe for Buford Dresser, MD.,have documented all relevant documentation on the behalf of Buford Dresser, MD,as directed by  Buford Dresser, MD while in the presence of Buford Dresser, MD.  I, Shawna Orleans  Harrell Gave, MD, have reviewed all documentation for this visit. The documentation on 04/25/21 for the exam, diagnosis, procedures, and orders are all accurate and complete.   Signed, Buford Dresser, MD PhD 04/25/2021 8:25 AM    [place]

## 2021-05-10 NOTE — Unmapped (Signed)
[facility] Clinical Assessment & Refill Coordination Note    Miguel Sharp, DOB: Oct 08, 1960  Phone: [phone] (home) [phone] (work)    All above HIPAA information was verified with patient.     Was a Nurse, learning disability used for this call? No    Specialty Medication(s):   Inflammatory Disorders: Benlysta and mycophenolate     Current Outpatient Medications   Medication Sig Dispense Refill   ??? amLODIPine (NORVASC) 10 MG tablet Take 10 mg by mouth daily.     ??? belimumab (BENLYSTA) 200 mg/mL AtIn Inject the contents of 1 pen (200 mg) under the skin once a week as maintenance. 4 mL 1   ??? belimumab (BENLYSTA) 200 mg/mL AtIn Inject the contents of 2 pens (400 mg) under the skin once a week for 3 weeks. THEN inject 1 pen ( 200mg ) under the skin every week thereafter. 8 mL 0   ??? ELIQUIS 5 mg Tab Take 5 mg by mouth Two (2) times a day.     ??? empty container Misc USE AS DIRECTED 1 each 2   ??? furosemide (LASIX) 80 MG tablet Take 80 mg by mouth Two (2) times a day.     ??? hydrOXYchloroQUINE (PLAQUENIL) 200 mg tablet Take 200 mg by mouth daily.     ??? mycophenolate (CELLCEPT) 500 mg tablet Take 3 tablets (1,500 mg total) by mouth two (2) times a day. 540 tablet 3     No current facility-administered medications for this visit.        Changes to medications: Beverley reports no changes at this time.    No Known Allergies    Changes to allergies: No    SPECIALTY MEDICATION ADHERENCE     mycophenolate 500 mg: ~10 days of medicine on hand            Specialty medication(s) dose(s) confirmed: Regimen is correct and unchanged.     Are there any concerns with adherence? No    Adherence counseling provided? Not needed    CLINICAL MANAGEMENT AND INTERVENTION      Clinical Benefit Assessment:    Do you feel the medicine is effective or helping your condition? Yes    Clinical Benefit counseling provided? Patient has an appt on 2/3 for follow up    Adverse Effects Assessment:    Are you experiencing any side effects? No    Are you experiencing difficulty administering your medicine? No    Quality of Life Assessment:    Quality of Life    Rheumatology  Oncology  Dermatology  Cystic Fibrosis          How many days over the past month did your condition  keep you from your normal activities? For example, brushing your teeth or getting up in the morning. Patient declined to answer    Have you discussed this with your provider? Not needed    Acute Infection Status:    Acute infections noted within Epic:  No active infections  Patient reported infection: None    Therapy Appropriateness:    Is therapy appropriate and patient progressing towards therapeutic goals? Yes, therapy is appropriate and should be continued    DISEASE/MEDICATION-SPECIFIC INFORMATION      For patients on injectable medications: Patient currently has 2 doses left.  Next injection is scheduled for 2/3, 2/10.    PATIENT SPECIFIC NEEDS     - Does the patient have any physical, cognitive, or cultural barriers? No    -  Is the patient high risk? No    - Does the patient require a Care Management Plan? No     SOCIAL DETERMINANTS OF HEALTH     At the [facility], we have learned that life circumstances - like trouble affording food, housing, utilities, or transportation can affect the health of many of our patients.   That is why we wanted to ask: are you currently experiencing any life circumstances that are negatively impacting your health and/or quality of life? Patient declined to answer    Social Determinants of Health     Food Insecurity: Not on file   Tobacco Use: Low Risk    ??? Smoking Tobacco Use: Never   ??? Smokeless Tobacco Use: Never   ??? Passive Exposure: Not on file   Transportation Needs: Not on file   Alcohol Use: Not on file   Housing/Utilities: Not on file   Substance Use: Not on file   Financial Resource Strain: Not on file   Physical Activity: Not on file   Health Literacy: Not on file   Stress: Not on file   Intimate Partner Violence: Not on file   Depression: Not on file   Social Connections: Not on file       Would you be willing to receive help with any of the needs that you have identified today? Not applicable       SHIPPING     Specialty Medication(s) to be Shipped:   Inflammatory Disorders: Benlysta    Other medication(s) to be shipped: No additional medications requested for fill at this time     Changes to insurance: No    Delivery Scheduled: Yes, Expected medication delivery date: 2/9.     Medication will be delivered via UPS to the confirmed prescription address in [facility].    The patient will receive a drug information handout for each medication shipped and additional FDA Medication Guides as required.  Verified that patient has previously received a Conservation officer, historic buildings and a Surveyor, mining.    The patient or caregiver noted above participated in the development of this care plan and knows that they can request review of or adjustments to the care plan at any time.      All of the patient's questions and concerns have been addressed.    Julianne Rice   [facility] Specialty Pharmacist

## 2021-05-18 MED FILL — MYCOPHENOLATE MOFETIL 500 MG TABLET: ORAL | 30 days supply | Qty: 180 | Fill #3

## 2021-05-18 MED FILL — BENLYSTA 200 MG/ML SUBCUTANEOUS AUTO-INJECTOR: SUBCUTANEOUS | 28 days supply | Qty: 4 | Fill #0

## 2021-06-09 NOTE — Unmapped (Signed)
[facility] Refill Coordination Note    Specialty Medication(s) to be Shipped:   Inflammatory Disorders: Benlysta and mycophenolate    Other medication(s) to be shipped: No additional medications requested for fill at this time     Miguel Sharp, DOB: 1960-06-06  Phone: [phone] (home) [phone] (work)      All above HIPAA information was verified with patient.        Was a Nurse, learning disability used for this call? No    Completed refill call assessment today to schedule patient's medication shipment from the [facility] [phone]).  All relevant notes have been reviewed.     Specialty medication(s) and dose(s) confirmed: Regimen is correct and unchanged.   Changes to medications: Miguel Sharp reports no changes at this time.  Changes to insurance: No  New side effects reported not previously addressed with a pharmacist or physician: None reported  Questions for the pharmacist: No    Confirmed patient received a Conservation officer, historic buildings and a Surveyor, mining with first shipment. The patient will receive a drug information handout for each medication shipped and additional FDA Medication Guides as required.       DISEASE/MEDICATION-SPECIFIC INFORMATION        For patients on injectable medications: Patient currently has 0 doses left.  Next injection is scheduled for 06/17/21.    SPECIALTY MEDICATION ADHERENCE     Medication Adherence    Patient reported X missed doses in the last month: 0  Specialty Medication: Benlysta  Patient is on additional specialty medications: Yes  Additional Specialty Medications: Mycophenolate  Patient Reported Additional Medication X Missed Doses in the Last Month: 0  Patient is on more than two specialty medications: No        Mycophenolate  10 days worth of medication on hand.        Were doses missed due to medication being on hold? No        REFERRAL TO PHARMACIST     Referral to the pharmacist: Not needed      [facility]     Shipping address confirmed in Epic.     Delivery Scheduled: Yes, Expected medication delivery date: 06/14/21.     Medication will be delivered via UPS to the prescription address in Epic WAM.    [place] A Kalayah Leske   [facility] Specialty Technician

## 2021-06-13 MED FILL — BENLYSTA 200 MG/ML SUBCUTANEOUS AUTO-INJECTOR: SUBCUTANEOUS | 28 days supply | Qty: 4 | Fill #1

## 2021-06-13 MED FILL — MYCOPHENOLATE MOFETIL 500 MG TABLET: ORAL | 30 days supply | Qty: 180 | Fill #4

## 2021-06-23 NOTE — Progress Notes (Signed)
? ?Office Visit Note ? ?Patient: Gerald Lloyd             ?Date of Birth: 08-29-1960           ?MRN: [id number]             ?PCP: Bonnita Hollow, MD ?Referring: Bonnita Hollow, MD ?Visit Date: 06/24/2021 ? ? ?Subjective:  ? ?History of Present Illness: Gerald Lloyd is a 61 y.o. male here for follow up for systemic lupus with lupus nephritis on HCQ 400 mg daily, cellcept 1500 mg BID, Benlysta 200 mg  weekly, prednisone 5 mg daily. He started benlysta with induction dose in January due partial renal response. He has not noticed any problems taking the Benlysta.  Leg swelling is doing well.  He has not had any shortness of breath or chest pain.  He does report episodes of burping during the past month these are not every day but are very noticeable when they occur.  He is not having any other problems such as nausea diarrhea abdominal pain or acid reflux.  He had follow-up with Dr. Joelyn Oms in the past month reports findings indicated continue to trend in the right direction but still not a complete renal response. ? ?Previous HPI ?03/24/21 ?Gerald Lloyd is a 61 y.o. male here for follow up for systemic lupus with class IV/V nephritis now on HCQ 400 mg daily, cellcept 1500 mg BID, and prednisone 5 mg daily. Most recent follow up with Dr. Joelyn Oms with continued proteinuria partially improving.  He continues having mild bilateral leg swelling no other symptom complaints. ?  ?Previous HPI ?12/22/20 ?Gerald Lloyd is a 61 y.o. male with history of lower extremity DVT on eliquis anticoagulation here for new diagnosis systemic lupus.  In February he was evaluated for unilateral left leg swelling with ultrasound exam finding a DVT in the left popliteal vein unprovoked.  There was ongoing local swelling of the left calf for several months.  He then presented to the hospital due to shortness of breath.  Work-up identified bilateral pleural effusions, large circumferential pericardial effusion with tamponade  and volume overload, lupus nephritis. He was started with pulse dose steroids, cellcept, and hydroxychloroquine.  And headache very good improvement after starting the high-dose steroids.  He has followed up for this with Dr. Joelyn Oms at [facility] on induction therapy for lupus nephritis.  He currently feels that most of his symptoms are much improved he does have a generalized weakness and decreased exertion tolerance ever since his hospital stay. ?He denies any particular skin rashes, alopecia, arthritis, oral ulcers, or Raynaud's symptoms.  He does not know of any family history of autoimmune disease.  He has never experienced similar symptoms prior to these events. ?  ?Lupus manifestations ?Clas IV/V Lupus nephritis ?DVT ?Pleural effusions ?Pericarditis ?  ?Labs reviewed ?11/2020 ?dsDNA >300 ?Smith 1.7 ?Chromatin >8.0 ?C3 61 ?C4 7 ?Iron 15 TIBC 172 Sat 9% ?Ferritin 1,419 ?HAV/HBV/HCV neg ?HIV neg ?  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 ? ?Review of Systems  ?Constitutional:  Positive for fatigue.  ?HENT:  Negative for mouth sores, mouth dryness and nose dryness.   ?Eyes:  Negative for pain, itching and dryness.  ?Respiratory:  Negative for shortness of breath and difficulty breathing.   ?Cardiovascular:  Negative for chest pain and palpitations.  ?Gastrointestinal:  Negative for blood in stool, constipation and diarrhea.  ?Endocrine: Negative for increased urination.  ?Genitourinary:  Negative  for difficulty urinating.  ?Musculoskeletal:  Negative for joint pain, joint pain, joint swelling, myalgias, morning stiffness, muscle tenderness and myalgias.  ?Skin:  Negative for color change, rash and redness.  ?Allergic/Immunologic: Negative for susceptible to infections.  ?Neurological:  Negative for dizziness, numbness, headaches, memory loss and weakness.  ?Hematological:  Negative for bruising/bleeding tendency.   ?Psychiatric/Behavioral:  Negative for confusion.   ? ?PMFS History:  ?Patient Active Problem List  ? Diagnosis Date Noted  ? High risk medication use 03/24/2021  ? Left leg DVT ([place]) 12/22/2020  ? SLE (systemic lupus erythematosus related syndrome) ([place]) 11/27/2020  ? Lupus nephritis ([place]) 11/27/2020  ? Bilateral pleural effusion 11/25/2020  ? AKI (acute kidney injury) ([place]) 11/24/2020  ? Pericardial effusion 11/24/2020  ? Anemia 11/24/2020  ? Dyspnea 11/24/2020  ? Essential hypertension 11/24/2020  ? Acute on chronic diastolic CHF (congestive heart failure) ([place]) 11/23/2020  ?  ?Past Medical History:  ?Diagnosis Date  ? Anemia   ? DVT (deep venous thrombosis) ([place])   ? Hypertension   ?  ?Family History  ?Problem Relation Age of Onset  ? Breast cancer Mother   ? Colon cancer Mother   ? Diabetes Father   ? Healthy Son   ? Healthy Son   ? ?Past Surgical History:  ?Procedure Laterality Date  ? IR THORACENTESIS ASP PLEURAL SPACE W/IMG GUIDE  11/25/2020  ? IR THORACENTESIS ASP PLEURAL SPACE W/IMG GUIDE  11/26/2020  ? ?Social History  ? ?Social History Narrative  ? Not on file  ? ?Immunization History  ?Administered Date(s) Administered  ? Moderna Sars-Covid-2 Vaccination 07/30/2019, 12/10/2019, 03/25/2020  ?  ? ?Objective: ?Vital Signs: BP 124/78 (BP Location: Left Arm, Patient Position: Sitting, Cuff Size: Normal)   Pulse 65   Ht '5\' 10"'  (1.778 m)   Wt 174 lb 6.4 oz (79.1 kg)   BMI 25.02 kg/m?   ? ?Physical Exam ?HENT:  ?   Nose: Nose normal.  ?   Mouth/Throat:  ?   Mouth: Mucous membranes are moist.  ?   Pharynx: Oropharynx is clear.  ?Eyes:  ?   Conjunctiva/sclera: Conjunctivae normal.  ?Cardiovascular:  ?   Rate and Rhythm: Normal rate and regular rhythm.  ?   Comments: Heart sounds are clear, occasional PVCs ?Pulmonary:  ?   Effort: Pulmonary effort is normal.  ?   Breath sounds: Normal breath sounds.  ?Musculoskeletal:  ?   Comments: Trace bilateral pedal edema  ?Skin: ?   General: Skin is warm and dry.  ?    Findings: No rash.  ?Neurological:  ?   Mental Status: He is alert.  ?Psychiatric:     ?   Mood and Affect: Mood normal.  ?  ? ?Musculoskeletal Exam:  ?Shoulders full ROM no tenderness or swelling ?Elbows full ROM no tenderness or swelling ?Wrists full ROM no tenderness or swelling ?Fingers full ROM no tenderness or swelling ?Knees full ROM no tenderness or swelling ?Ankles full ROM no tenderness or swelling ? ? ?Investigation: ?No additional findings. ? ?Imaging: ?No results found. ? ?Recent Labs: ?Lab Results  ?Component Value Date  ? WBC 9.3 11/30/2020  ? HGB 8.9 (L) 11/30/2020  ? PLT 363 11/30/2020  ? NA 136 11/29/2020  ? K 3.9 11/29/2020  ? CL 107 11/29/2020  ? CO2 23 11/29/2020  ? GLUCOSE 98 11/29/2020  ? BUN 23 (H) 11/29/2020  ? CREATININE 1.27 (H) 11/29/2020  ? BILITOT 0.4 11/26/2020  ? ALKPHOS 61 11/26/2020  ? AST 24  11/26/2020  ? ALT 19 11/26/2020  ? PROT 4.6 (L) 11/26/2020  ? ALBUMIN 1.4 (L) 11/29/2020  ? CALCIUM 7.7 (L) 11/29/2020  ? QFTBGOLDPLUS NEGATIVE 03/24/2021  ? ? ?Speciality Comments: No specialty comments available. ? ?Procedures:  ?No procedures performed ?Allergies: Patient has no known allergies.  ? ?Assessment / Plan:     ?Visit Diagnoses: SLE (systemic lupus erythematosus related syndrome) ([place]) ? ?Labs from 05/05/21 were obtained and reviewed protein/creatinine ratio of 1768 otherwise normal UA and eGFR of 65. CBC with WBC 11.3, Hbg 12.1, and platelets 476. I don't see any evidence of serious side effects requiring medication change, still incomplete renal response. ?No extrarenal disease activity seen at this time. He is continuing HCQ 200 mg daily, MMF 1500 mg BID, prednisone 5 mg daily, and benlysta 200 mg [place] weekly. ? ?Lupus nephritis ([place]) ? ?Early to decide benlysta effect at about 2 months maximum effect expected in 3-6 months. ? ?High risk medication use ? ?Will request recent [facility] results from [place] for monitoring, no reported side effects. I am not sure about burping unless early  stomach or esophagus irritation. ? ?Acute on chronic diastolic CHF (congestive heart failure) ([place]) ?Pericardial effusion ? ?Leg swelling is minimal, no shortness of breath and heart sounds are normal so do not suspect signific

## 2021-06-24 ENCOUNTER — Ambulatory Visit: Payer: Commercial Managed Care - PPO | Admitting: Internal Medicine

## 2021-06-24 ENCOUNTER — Encounter: Payer: Self-pay | Admitting: Internal Medicine

## 2021-06-24 ENCOUNTER — Other Ambulatory Visit: Payer: Self-pay

## 2021-06-24 VITALS — BP 124/78 | HR 65 | Ht 70.0 in | Wt 174.4 lb

## 2021-06-24 DIAGNOSIS — M329 Systemic lupus erythematosus, unspecified: Secondary | ICD-10-CM | POA: Diagnosis not present

## 2021-06-24 DIAGNOSIS — Z79899 Other long term (current) drug therapy: Secondary | ICD-10-CM | POA: Diagnosis not present

## 2021-06-24 DIAGNOSIS — I5033 Acute on chronic diastolic (congestive) heart failure: Secondary | ICD-10-CM | POA: Diagnosis not present

## 2021-06-24 DIAGNOSIS — M3214 Glomerular disease in systemic lupus erythematosus: Secondary | ICD-10-CM

## 2021-06-24 DIAGNOSIS — I3139 Other pericardial effusion (noninflammatory): Secondary | ICD-10-CM

## 2021-07-08 ENCOUNTER — Other Ambulatory Visit: Payer: Self-pay | Admitting: Internal Medicine

## 2021-07-08 DIAGNOSIS — M3214 Glomerular disease in systemic lupus erythematosus: Secondary | ICD-10-CM

## 2021-07-08 DIAGNOSIS — M329 Systemic lupus erythematosus, unspecified: Secondary | ICD-10-CM

## 2021-07-08 DIAGNOSIS — Z79899 Other long term (current) drug therapy: Secondary | ICD-10-CM

## 2021-07-08 MED ORDER — BENLYSTA 200 MG/ML SUBCUTANEOUS AUTO-INJECTOR
SUBCUTANEOUS | 1 refills | 28 days
Start: 2021-07-08 — End: ?

## 2021-07-08 NOTE — Unmapped (Signed)
[facility] Refill Coordination Note    Specialty Medication(s) to be Shipped:   Inflammatory Disorders: Benlysta and mycophenolate    Other medication(s) to be shipped: No additional medications requested for fill at this time     Miguel Sharp, DOB: [birth year]  Phone: [phone] (home) [phone] (work)      All above HIPAA information was verified with patient.        Was a Nurse, learning disability used for this call? No    Completed refill call assessment today to schedule patient's medication shipment from the [facility] [phone]).  All relevant notes have been reviewed.     Specialty medication(s) and dose(s) confirmed: Regimen is correct and unchanged.   Changes to medications: Miguel Sharp reports no changes at this time.  Changes to insurance: No  New side effects reported not previously addressed with a pharmacist or physician: None reported  Questions for the pharmacist: No    Confirmed patient received a Conservation officer, historic buildings and a Surveyor, mining with first shipment. The patient will receive a drug information handout for each medication shipped and additional FDA Medication Guides as required.       DISEASE/MEDICATION-SPECIFIC INFORMATION        For patients on injectable medications: Patient currently has 2 doses left.  Next injection is scheduled for 07/08/2021.    SPECIALTY MEDICATION ADHERENCE     Medication Adherence    Patient reported X missed doses in the last month: 0  Specialty Medication: Benlysta  Patient is on additional specialty medications: Yes  Additional Specialty Medications: Mycophenolate  Patient Reported Additional Medication X Missed Doses in the Last Month: 0  Patient is on more than two specialty medications: No  Any gaps in refill history greater than 2 weeks in the last 3 months: no  Demonstrates understanding of importance of adherence: yes  Informant: patient  Reliability of informant: reliable  Confirmed plan for next specialty medication refill: delivery by pharmacy  Refills needed for supportive medications: not needed        Were doses missed due to medication being on hold? No     Mycophenolate 500 mg: 10 days of medication on hand.        REFERRAL TO PHARMACIST     Referral to the pharmacist: Not needed      [facility]     Shipping address confirmed in Epic.     Delivery Scheduled: Yes, Expected medication delivery date: 07/13/2021.  However, Rx request for refills was sent to the provider as there are none remaining.     Medication will be delivered via UPS to the prescription address in Epic WAM.    Miguel Sharp   [facility] Specialty Technician

## 2021-07-08 NOTE — Telephone Encounter (Signed)
Next Visit: 09/23/2021 ? ?Last Visit: 06/24/2021 ? ?Last Fill: 04/14/2021 ? ?DX:SLE (systemic lupus erythematosus related syndrome) ? ?Current Dose per office note 06/24/2021: benlysta 200 mg [place] weekly. ? ?Labs: Labs from 05/05/21 were obtained and reviewed protein/creatinine ratio of 1768 otherwise normal UA and eGFR of 65. CBC with WBC 11.3, Hbg 12.1, and platelets 476.  ? ?TB Gold: 03/24/2021 Neg   ? ?Okay to refill Benlysta?  ?

## 2021-07-11 MED ORDER — BENLYSTA 200 MG/ML SUBCUTANEOUS AUTO-INJECTOR
SUBCUTANEOUS | 1 refills | 28 days
Start: 2021-07-11 — End: ?

## 2021-07-11 NOTE — Unmapped (Signed)
[facility] ([facility]-- Medication Adherence  NO OUTREACH    Kholton Coate is a 61 y.o. male identified as having an adherence of <30%  for a hypertension medication, based on payer reports and chart review.    Population:  [id number]    After chart and pharmacy claims review regarding the medication(s) below, patient outreach was not needed due to the following reason(s):       losartan 50/12.5  mg: Medication discontinued per chart note dated 03/15/21 by Catarina Hartshorn , CPhT  Certified Pharmacy Technician  [facility] ([facility])

## 2021-07-12 MED ORDER — BENLYSTA 200 MG/ML SUBCUTANEOUS AUTO-INJECTOR
2 refills | 0 days
Start: 2021-07-12 — End: ?

## 2021-07-12 MED ORDER — BELIMUMAB 200 MG/ML SUBCUTANEOUS AUTO-INJECTOR
SUBCUTANEOUS | 1 refills | 28 days
Start: 2021-07-12 — End: ?

## 2021-07-12 MED FILL — MYCOPHENOLATE MOFETIL 500 MG TABLET: ORAL | 30 days supply | Qty: 180 | Fill #5

## 2021-07-12 MED FILL — BENLYSTA 200 MG/ML SUBCUTANEOUS AUTO-INJECTOR: 28 days supply | Qty: 4 | Fill #0

## 2021-08-03 NOTE — Unmapped (Signed)
[facility] Refill Coordination Note    Specialty Medication(s) to be Shipped:   Inflammatory Disorders: Benlysta and mycophenolate    Other medication(s) to be shipped: No additional medications requested for fill at this time     Miguel Sharp, DOB: 1961-03-16  Phone: [phone] (home) [phone] (work)      All above HIPAA information was verified with patient.     Was a Nurse, learning disability used for this call? No    Completed refill call assessment today to schedule patient's medication shipment from the [facility] [phone]).  All relevant notes have been reviewed.     Specialty medication(s) and dose(s) confirmed: Regimen is correct and unchanged.   Changes to medications: [place] reports no changes at this time.  Changes to insurance: No  New side effects reported not previously addressed with a pharmacist or physician: None reported  Questions for the pharmacist: No    Confirmed patient received a Conservation officer, historic buildings and a Surveyor, mining with first shipment. The patient will receive a drug information handout for each medication shipped and additional FDA Medication Guides as required.       DISEASE/MEDICATION-SPECIFIC INFORMATION        For patients on injectable medications: Patient currently has 2 doses left.  Next injection is scheduled for 08/05/21.    SPECIALTY MEDICATION ADHERENCE     Medication Adherence    Patient reported X missed doses in the last month: 0  Specialty Medication: mycophenolate 500 mg  Patient is on additional specialty medications: Yes  Additional Specialty Medications: BENLYSTA 200 mg/mL    Patient Reported Additional Medication X Missed Doses in the Last Month: 0  Patient is on more than two specialty medications: No              Were doses missed due to medication being on hold? No    Benlysta 200 mg/ml: 8 days of medicine on hand   Mycophenolate 500 mg: 8 days of medicine on hand        REFERRAL TO PHARMACIST     Referral to the pharmacist: Not needed      [facility]     Shipping address confirmed in Epic.     Delivery Scheduled: Yes, Expected medication delivery date: 08/09/21.     Medication will be delivered via UPS to the prescription address in Epic WAM.    Unk Lightning   [facility] Specialty Technician

## 2021-08-08 MED FILL — BENLYSTA 200 MG/ML SUBCUTANEOUS AUTO-INJECTOR: 28 days supply | Qty: 4 | Fill #1

## 2021-08-08 MED FILL — MYCOPHENOLATE MOFETIL 500 MG TABLET: ORAL | 30 days supply | Qty: 180 | Fill #6

## 2021-09-02 NOTE — Unmapped (Signed)
[facility] Refill Coordination Note    Specialty Medication(s) to be Shipped:   Inflammatory Disorders: Benlysta and mycophenolate    Other medication(s) to be shipped: No additional medications requested for fill at this time     Miguel Sharp, DOB: 14-Jan-1961  Phone: [phone] (home) [phone] (work)      All above HIPAA information was verified with patient.     Was a Nurse, learning disability used for this call? No    Completed refill call assessment today to schedule patient's medication shipment from the [facility] [phone]).  All relevant notes have been reviewed.     Specialty medication(s) and dose(s) confirmed: Regimen is correct and unchanged.   Changes to medications: Miguel Sharp reports no changes at this time.  Changes to insurance: No  New side effects reported not previously addressed with a pharmacist or physician: None reported  Questions for the pharmacist: No    Confirmed patient received a Conservation officer, historic buildings and a Surveyor, mining with first shipment. The patient will receive a drug information handout for each medication shipped and additional FDA Medication Guides as required.       DISEASE/MEDICATION-SPECIFIC INFORMATION        For patients on injectable medications: Patient currently has 2 doses left.  Next injection is scheduled for 6/2.    SPECIALTY MEDICATION ADHERENCE     Medication Adherence    Patient reported X missed doses in the last month: 0  Specialty Medication: Benlysta  Patient is on additional specialty medications: Yes  Additional Specialty Medications: Mycophenolate  Patient Reported Additional Medication X Missed Doses in the Last Month: 0  Patient is on more than two specialty medications: No  Any gaps in refill history greater than 2 weeks in the last 3 months: no  Demonstrates understanding of importance of adherence: yes  Informant: patient              Were doses missed due to medication being on hold? No    Benlysta 200mg /ml: Patient has 14 days of medication on hand  Mycophenolate 500mg : Patient has 14 days of medication on hand    REFERRAL TO PHARMACIST     Referral to the pharmacist: Not needed      [facility]     Shipping address confirmed in Epic.     Delivery Scheduled: Yes, Expected medication delivery date: 6/6.     Medication will be delivered via UPS to the prescription address in Epic WAM.    Miguel Sharp   [facility] Specialty Technician

## 2021-09-09 NOTE — Progress Notes (Signed)
Office Visit Note  Patient: Gerald Lloyd             Date of Birth: 03-16-1961           MRN: [id number]             PCP: Garnette Gunner, MD Referring: Garnette Gunner, MD Visit Date: 09/23/2021   Subjective:   History of Present Illness: Gerald Lloyd is a 61 y.o. male here for follow up for systemic lupus with lupus nephritis on HCQ 400 mg daily, MMF 1500 mg BID, Benlysta 200 mg [place] weekly, prednisone 5 mg daily. He is doing well overall without major interval events. He started on low dose lisinopril for continued proteinuria.  Previous HPI 06/24/2021 Gerald Lloyd is a 61 y.o. male here for follow up for systemic lupus with lupus nephritis on HCQ 400 mg daily, cellcept 1500 mg BID, Benlysta 200 mg [place] weekly, prednisone 5 mg daily. He started benlysta with induction dose in January due partial renal response. He has not noticed any problems taking the Benlysta. Leg swelling is doing well.  He has not had any shortness of breath or chest pain.  He does report episodes of burping during the past month these are not every day but are very noticeable when they occur.  He is not having any other problems such as nausea diarrhea abdominal pain or acid reflux.  He had follow-up with Dr. Marisue Humble in the past month reports findings indicated continue to trend in the right direction but still not a complete renal response.   Previous HPI 03/24/21 Gerald Lloyd is a 61 y.o. male here for follow up for systemic lupus with class IV/V nephritis now on HCQ 400 mg daily, cellcept 1500 mg BID, and prednisone 5 mg daily. Most recent follow up with Dr. Marisue Humble with continued proteinuria partially improving.  He continues having mild bilateral leg swelling no other symptom complaints.   Previous HPI 12/22/20 Gerald Lloyd is a 61 y.o. male with history of lower extremity DVT on eliquis anticoagulation here for new diagnosis systemic lupus.  In February he was evaluated for unilateral left  leg swelling with ultrasound exam finding a DVT in the left popliteal vein unprovoked.  There was ongoing local swelling of the left calf for several months.  He then presented to the hospital due to shortness of breath.  Work-up identified bilateral pleural effusions, large circumferential pericardial effusion with tamponade and volume overload, lupus nephritis. He was started with pulse dose steroids, cellcept, and hydroxychloroquine.  And headache very good improvement after starting the high-dose steroids.  He has followed up for this with Dr. Marisue Humble at [facility] on induction therapy for lupus nephritis.  He currently feels that most of his symptoms are much improved he does have a generalized weakness and decreased exertion tolerance ever since his hospital stay. He denies any particular skin rashes, alopecia, arthritis, oral ulcers, or Raynaud's symptoms.  He does not know of any family history of autoimmune disease.  He has never experienced similar symptoms prior to these events.   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  HENT:  Negative for mouth dryness.  Eyes:  Negative for dryness.  Respiratory:  Negative for shortness of breath.   Cardiovascular:  Negative for swelling in legs/feet.  Gastrointestinal:  Negative for constipation.  Endocrine: Negative for excessive thirst.  Genitourinary:  Negative for difficulty urinating.  Musculoskeletal:  Negative for morning stiffness.  Skin:  Negative for rash.  Allergic/Immunologic: Negative for susceptible to infections.  Neurological:  Negative for numbness.  Hematological:   Negative for bruising/bleeding tendency.  Psychiatric/Behavioral:  Negative for sleep disturbance.     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/27/2020   Bilateral pleural effusion 11/25/2020   AKI (acute kidney injury) (HCC) 11/24/2020   Pericardial effusion 11/24/2020   Anemia 11/24/2020   Dyspnea 11/24/2020   Essential hypertension 11/24/2020   Acute on chronic diastolic CHF (congestive heart failure) (HCC) 11/23/2020    Past Medical History:  Diagnosis Date   Anemia    DVT (deep venous thrombosis) (HCC)    Hypertension     Family History  Problem Relation Age of Onset   Breast cancer Mother    Colon cancer Mother    Diabetes Father    Healthy Son    Healthy Son    Past Surgical History:  Procedure Laterality Date   IR THORACENTESIS ASP PLEURAL SPACE W/IMG GUIDE  11/25/2020   IR THORACENTESIS ASP PLEURAL SPACE W/IMG GUIDE  11/26/2020   Social History   Social History Narrative   Not on file   Immunization History  Administered Date(s) Administered   Moderna Sars-Covid-2 Vaccination 07/30/2019, 12/10/2019, 03/25/2020     Objective: Vital Signs: BP 112/76 (BP Location: Left Arm, Patient Position: Sitting, Cuff Size: Small)   Pulse 62   Resp 12   Ht 5\' 10"  (1.778 m)   Wt 173 lb 6.4 oz (78.7 kg)   BMI 24.88 kg/m    Physical Exam HENT:     Mouth/Throat:     Mouth: Mucous membranes are moist.     Pharynx: Oropharynx is clear.  Eyes:     Conjunctiva/sclera: Conjunctivae normal.  Cardiovascular:     Rate and Rhythm: Normal rate and regular rhythm.  Pulmonary:     Effort: Pulmonary effort is normal.     Breath sounds: Normal breath sounds.  Musculoskeletal:     Right lower leg: No edema.     Left lower leg: No edema.   Lymphadenopathy:     Cervical: No cervical adenopathy.  Skin:    General: Skin is warm and dry.     Findings: Rash present.     Comments: Petechial rash on bilateral forearms on flexor surface  Neurological:     Mental Status: He is alert.  Psychiatric:        Mood and Affect: Mood normal.      Musculoskeletal Exam:  Elbows full ROM no tenderness or swelling Wrists full ROM no tenderness or swelling Fingers full ROM no tenderness or swelling Knees full ROM no tenderness or swelling Ankles full ROM no tenderness or swelling   Investigation: No additional findings.  Imaging: No results found.  Recent Labs: Lab Results  Component Value Date   WBC 9.3 11/30/2020   HGB 8.9 (L) 11/30/2020   PLT 363 11/30/2020   NA 136 11/29/2020   K 3.9 11/29/2020   CL  107 11/29/2020   CO2 23 11/29/2020   GLUCOSE 98 11/29/2020   BUN 23 (H) 11/29/2020   CREATININE 1.27 (H) 11/29/2020   BILITOT 0.4 11/26/2020   ALKPHOS 61 11/26/2020   AST 24 11/26/2020   ALT 19 11/26/2020   PROT 4.6 (L) 11/26/2020   ALBUMIN 1.4 (L) 11/29/2020   CALCIUM 7.7 (L) 11/29/2020   QFTBGOLDPLUS NEGATIVE 03/24/2021    Speciality Comments: No specialty comments available.  Procedures:  No procedures performed Allergies: Patient has no known allergies.   Assessment / Plan:     Visit Diagnoses: SLE (systemic lupus erythematosus related syndrome) (HCC) - Plan: Anti-DNA antibody, double-stranded,   No particular extrarenal symptoms of lupus activity.  We will recheck double-stranded DNA for serum marker of disease activity.  Plan for continuing the hydroxychloroquine 200 mg daily Benlysta 200 mg subcu weekly mycophenolate 1500 mg twice daily is on 5 mg daily.  Lupus nephritis (HCC)  High risk medication use - HCQ 200 mg daily, MMF 1500 mg BID, prednisone 5 mg daily, and benlysta 200 mg [place] weekly. PLQ eye exam? - Plan: CBC with Differential/Platelet, COMPLETE METABOLIC PANEL WITH GFR  Checking CBC and CMP  for medication monitoring on MMF and belnysta. Will also contact [facility] office for recent [facility] results records.  Acute on chronic diastolic CHF (congestive heart failure) (HCC) Pericardial effusion  Improved  Antiphospholipid antibody positive - Plan: Cardiolipin antibodies, IgG, IgM, IgA, Beta-2 glycoprotein antibodies  On eliquis currently from LEDVT last year. Possibly hypercoagulable due to lupus and nephrotic syndrome, also has positive B2GP1 and ACA. Rechecking today.  Orders: Orders Placed This Encounter  Procedures   Anti-DNA antibody, double-stranded   Cardiolipin antibodies, IgG, IgM, IgA   Beta-2 glycoprotein antibodies   CBC with Differential/Platelet   COMPLETE METABOLIC PANEL WITH GFR   No orders of the defined types were placed in this encounter.    Follow-Up Instructions: Return in about 3 months (around 12/24/2021) for SLE on HCQ/MMF/BLY/GC f/u 59mos.   Fuller Plan, MD  Note - This record has been created using [employer].  Chart creation errors have been sought, but may not always  have been located. Such creation errors do not reflect on  the standard of medical care.

## 2021-09-12 MED FILL — BENLYSTA 200 MG/ML SUBCUTANEOUS AUTO-INJECTOR: 28 days supply | Qty: 4 | Fill #2

## 2021-09-12 MED FILL — MYCOPHENOLATE MOFETIL 500 MG TABLET: ORAL | 30 days supply | Qty: 180 | Fill #7

## 2021-09-13 ENCOUNTER — Other Ambulatory Visit: Payer: Self-pay | Admitting: Internal Medicine

## 2021-09-13 DIAGNOSIS — M329 Systemic lupus erythematosus, unspecified: Secondary | ICD-10-CM

## 2021-09-13 DIAGNOSIS — Z79899 Other long term (current) drug therapy: Secondary | ICD-10-CM

## 2021-09-13 DIAGNOSIS — M3214 Glomerular disease in systemic lupus erythematosus: Secondary | ICD-10-CM

## 2021-09-13 MED ORDER — BELIMUMAB 200 MG/ML SUBCUTANEOUS AUTO-INJECTOR
2 refills | 0 days
Start: 2021-09-13 — End: ?

## 2021-09-13 MED ORDER — BENLYSTA 200 MG/ML SUBCUTANEOUS AUTO-INJECTOR
SUBCUTANEOUS | 2 refills | 0 days
Start: 2021-09-13 — End: ?

## 2021-09-23 ENCOUNTER — Ambulatory Visit: Payer: Commercial Managed Care - PPO | Admitting: Internal Medicine

## 2021-09-23 ENCOUNTER — Encounter: Payer: Self-pay | Admitting: Internal Medicine

## 2021-09-23 VITALS — BP 112/76 | HR 62 | Resp 12 | Ht 70.0 in | Wt 173.4 lb

## 2021-09-23 DIAGNOSIS — Z79899 Other long term (current) drug therapy: Secondary | ICD-10-CM | POA: Diagnosis not present

## 2021-09-23 DIAGNOSIS — I5033 Acute on chronic diastolic (congestive) heart failure: Secondary | ICD-10-CM

## 2021-09-23 DIAGNOSIS — R760 Raised antibody titer: Secondary | ICD-10-CM

## 2021-09-23 DIAGNOSIS — I3139 Other pericardial effusion (noninflammatory): Secondary | ICD-10-CM

## 2021-09-23 DIAGNOSIS — M329 Systemic lupus erythematosus, unspecified: Secondary | ICD-10-CM | POA: Diagnosis not present

## 2021-09-23 DIAGNOSIS — M3214 Glomerular disease in systemic lupus erythematosus: Secondary | ICD-10-CM

## 2021-09-29 LAB — CBC WITH DIFFERENTIAL/PLATELET
Absolute Monocytes: 753 cells/uL (ref 200–950)
Basophils Absolute: 49 cells/uL (ref 0–200)
Basophils Relative: 0.6 %
Eosinophils Absolute: 49 cells/uL (ref 15–500)
Eosinophils Relative: 0.6 %
HCT: 40.4 % (ref 38.5–50.0)
Hemoglobin: 13.3 g/dL (ref 13.2–17.1)
Lymphs Abs: 980 cells/uL (ref 850–3900)
MCH: 28.9 pg (ref 27.0–33.0)
MCHC: 32.9 g/dL (ref 32.0–36.0)
MCV: 87.8 fL (ref 80.0–100.0)
MPV: 9.6 fL (ref 7.5–12.5)
Monocytes Relative: 9.3 %
Neutro Abs: 6269 cells/uL (ref 1500–7800)
Neutrophils Relative %: 77.4 %
Platelets: 376 10*3/uL (ref 140–400)
RBC: 4.6 10*6/uL (ref 4.20–5.80)
RDW: 13.6 % (ref 11.0–15.0)
Total Lymphocyte: 12.1 %
WBC: 8.1 10*3/uL (ref 3.8–10.8)

## 2021-09-29 LAB — COMPLETE METABOLIC PANEL WITH GFR
AG Ratio: 2.2 (calc) (ref 1.0–2.5)
ALT: 12 U/L (ref 9–46)
AST: 13 U/L (ref 10–35)
Albumin: 4.4 g/dL (ref 3.6–5.1)
Alkaline phosphatase (APISO): 84 U/L (ref 35–144)
BUN/Creatinine Ratio: 21 (calc) (ref 6–22)
BUN: 27 mg/dL — ABNORMAL HIGH (ref 7–25)
CO2: 29 mmol/L (ref 20–32)
Calcium: 9.5 mg/dL (ref 8.6–10.3)
Chloride: 102 mmol/L (ref 98–110)
Creat: 1.29 mg/dL (ref 0.70–1.30)
Globulin: 2 g/dL (calc) (ref 1.9–3.7)
Glucose, Bld: 83 mg/dL (ref 65–99)
Potassium: 4.5 mmol/L (ref 3.5–5.3)
Sodium: 139 mmol/L (ref 135–146)
Total Bilirubin: 0.5 mg/dL (ref 0.2–1.2)
Total Protein: 6.4 g/dL (ref 6.1–8.1)
eGFR: 64 mL/min/{1.73_m2} (ref >=60)

## 2021-09-29 LAB — CARDIOLIPIN ANTIBODIES, IGG, IGM, IGA
Anticardiolipin IgA: 5.8 APL-U/mL (ref <20.0)
Anticardiolipin IgG: 22.1 GPL-U/mL — ABNORMAL HIGH (ref <20.0)
Anticardiolipin IgM: <2 MPL-U/mL (ref <20.0)

## 2021-09-29 LAB — BETA-2 GLYCOPROTEIN ANTIBODIES
Beta-2 Glyco 1 IgA: 4.6 U/mL (ref <20.0)
Beta-2 Glyco 1 IgM: <2 U/mL (ref <20.0)
Beta-2 Glyco I IgG: 38.7 U/mL — ABNORMAL HIGH (ref <20.0)

## 2021-09-29 LAB — ANTI-DNA ANTIBODY, DOUBLE-STRANDED: ds DNA Ab: 121 IU/mL — ABNORMAL HIGH

## 2021-10-03 NOTE — Unmapped (Signed)
[facility] Refill Coordination Note    Specialty Medication(s) to be Shipped:   Inflammatory Disorders: Benlysta and mycophenolate 500mg  tab    Other medication(s) to be shipped: No additional medications requested for fill at this time     Miguel Sharp, DOB: 05/08/1960  Phone: [phone] (home) [phone] (work)      All above HIPAA information was verified with patient.     Was a Nurse, learning disability used for this call? No    Completed refill call assessment today to schedule patient's medication shipment from the [facility] [phone]).  All relevant notes have been reviewed.     Specialty medication(s) and dose(s) confirmed: Regimen is correct and unchanged.   Changes to medications: Nik reports no changes at this time.  Changes to insurance: No  New side effects reported not previously addressed with a pharmacist or physician: None reported  Questions for the pharmacist: No    Confirmed patient received a Conservation officer, historic buildings and a Surveyor, mining with first shipment. The patient will receive a drug information handout for each medication shipped and additional FDA Medication Guides as required.       DISEASE/MEDICATION-SPECIFIC INFORMATION        For patients on injectable medications: Patient currently has 2 doses left.  Next injection is scheduled for 10/07/21.    SPECIALTY MEDICATION ADHERENCE     Medication Adherence    Patient reported X missed doses in the last month: 0  Specialty Medication: Benlysta  Patient is on additional specialty medications: Yes  Additional Specialty Medications: Mycophenolate  Patient Reported Additional Medication X Missed Doses in the Last Month: 0  Patient is on more than two specialty medications: No  Informant: patient  Reliability of informant: reliable  Reasons for non-adherence: no problems identified              Were doses missed due to medication being on hold? No    BENLYSTA 200 mg//ml syr  : 14 days of medicine on hand   Mycophenolate 500mg  tab  : 14 days of medicine on hand       REFERRAL TO PHARMACIST     Referral to the pharmacist: Not needed      [facility]     Shipping address confirmed in Epic.     Delivery Scheduled: Yes, Expected medication delivery date: 10/13/21.     Medication will be delivered via UPS to the prescription address in Epic WAM.    Bonni Neuser' W Wilhemena Durie [facility] Specialty Technician

## 2021-10-12 MED FILL — BENLYSTA 200 MG/ML SUBCUTANEOUS AUTO-INJECTOR: SUBCUTANEOUS | 28 days supply | Qty: 4 | Fill #0

## 2021-10-12 MED FILL — MYCOPHENOLATE MOFETIL 500 MG TABLET: ORAL | 30 days supply | Qty: 180 | Fill #8

## 2021-10-18 NOTE — Unmapped (Cosign Needed)
[place] [facility] (CAMP) Clinic-- Medication Adherence Clinic SUCCESSFUL ENGAGEMENT      Miguel Sharp is a 61 y.o. male identified as having an adherence of <40%  for a hypertension medication, based on payer reports and chart review. Contacted the Patient to review adherence to the medication(s) described below.    Clinical Medication Adherence Assessment      Medication Assessed #1: LISINOPRIL   Chronic Disease State: Hypertension   Source of Data/Request for Medication Adherence Assessment: Payer Report   Does patient indicate they are taking their medication as prescribed?: Yes   Are prescription instructions in line with the way patient is currently taking this medication?: No   Provider alerted to differences in prescription instructions: Yes   Medication Refills Remaining?: Yes   Date of Last Refill: 08/30/21   Do you currently receive a 90-day supply of this medication?: No   Would you like for [place] to speak to your provider about a 90-day supply?: No   In general do you have any trouble obtaining or affording your medications?: No   Does the patient have any perceived side effects from this medication?: No         What other reasons can you think of that may have caused missed doses or gaps in medication fill history?: Currently have extra supply of medication due to dosage change   Reasons for Non-Adherence: Taking differently-Verbal change in Rx   Actions taken for this medication: None      General Medication Adherence: :   Population: NHP   Contact Source: Patient   Who helps you with your medications?: Manage Myself   What reminder methods or adherence aids do you currently use to take your medications?: None   Thinking about all of your medications, do you have any issues or concerns that we can further assist you with?: No   Would you like to switch to a [facility] in-house pharmacy for a lower copay?: No   Is patient eligible for CAMP VBBD enrollment?: No      Would you be interested in discussing your medication concerns further through a scheduled virtual visit with a pharmacist?: No   Interventions Taken: None              Clarke Amburn  Jimmey Ralph , CPhT  Certified Pharmacy Technician  [place] [facility] (CAMP)

## 2021-10-18 NOTE — Unmapped (Signed)
[place] [facility] (CAMP) Clinic-- Medication Adherence  UNREACHABLE    Miguel Sharp is a 61 y.o. male identified as having an adherence of <30%  for a hypertension medication, based on payer reports and chart review.       1st attempt call made to the patient's Cell number(s). There was no answer & voicemail was left to return call.       Population:  [id number]    Medication(s):    Blood pressure: lisinopril 5mg     Last Filled on 08/30/21   Refills remaining:YES        Siyona Coto  Jimmey Ralph , CPhT  Certified Pharmacy Technician  [place] [facility] (CAMP)

## 2021-11-04 NOTE — Unmapped (Signed)
[facility] Refill Coordination Note    Specialty Medication(s) to be Shipped:   Inflammatory Disorders: Benlysta and mycophenolate    Other medication(s) to be shipped: No additional medications requested for fill at this time     Miguel Sharp, DOB: September 10, 1960  Phone: [phone] (home) [phone] (work)      All above HIPAA information was verified with patient.     Was a Nurse, learning disability used for this call? No    Completed refill call assessment today to schedule patient's medication shipment from the [facility] [phone]).  All relevant notes have been reviewed.     Specialty medication(s) and dose(s) confirmed: Regimen is correct and unchanged.   Changes to medications: Genovevo reports no changes at this time.  Changes to insurance: No  New side effects reported not previously addressed with a pharmacist or physician: None reported  Questions for the pharmacist: No    Confirmed patient received a Conservation officer, historic buildings and a Surveyor, mining with first shipment. The patient will receive a drug information handout for each medication shipped and additional FDA Medication Guides as required.       DISEASE/MEDICATION-SPECIFIC INFORMATION        For patients on injectable medications: Patient currently has 1 doses left.  Next injection is scheduled for 8/4.    SPECIALTY MEDICATION ADHERENCE     Medication Adherence    Patient reported X missed doses in the last month: 0  Specialty Medication: Benlysta  Patient is on additional specialty medications: Yes  Additional Specialty Medications: Mycophenolate  Patient Reported Additional Medication X Missed Doses in the Last Month: 0  Patient is on more than two specialty medications: No  Any gaps in refill history greater than 2 weeks in the last 3 months: no  Demonstrates understanding of importance of adherence: yes  Informant: patient              Were doses missed due to medication being on hold? No    Benlysta 200mg /ml: Patient has 7 days of medication on hand  Mycophenolate 500mg : Patient has 7 days of medication on hand    REFERRAL TO PHARMACIST     Referral to the pharmacist: Not needed      [facility]     Shipping address confirmed in Epic.     Delivery Scheduled: Yes, Expected medication delivery date: 8/2.     Medication will be delivered via UPS to the prescription address in Epic WAM.    Olga Millers   [facility] Specialty Technician

## 2021-11-08 MED FILL — BENLYSTA 200 MG/ML SUBCUTANEOUS AUTO-INJECTOR: SUBCUTANEOUS | 28 days supply | Qty: 4 | Fill #1

## 2021-11-08 MED FILL — MYCOPHENOLATE MOFETIL 500 MG TABLET: ORAL | 30 days supply | Qty: 180 | Fill #9

## 2021-11-30 NOTE — Unmapped (Signed)
[facility] Clinical Assessment & Refill Coordination Note    Miguel Sharp, DOB: 1960/07/29  Phone: [phone] (home) [phone] (work)    All above HIPAA information was verified with patient.     Was a Nurse, learning disability used for this call? No    Specialty Medication(s):   Inflammatory Disorders: Benlysta and mycophenolate     Current Outpatient Medications   Medication Sig Dispense Refill   ??? amLODIPine (NORVASC) 10 MG tablet Take 10 mg by mouth daily.     ??? belimumab (BENLYSTA) 200 mg/mL AtIn Inject the contents of 2 pens (400 mg) under the skin once a week for 3 weeks. THEN inject 1 pen ( 200mg ) under the skin every week thereafter. 8 mL 0   ??? belimumab (BENLYSTA) 200 mg/mL AtIn Inject the contents of 1 auto-injector (200 mg total) under the skin every seven (7) days. 4 mL 2   ??? ELIQUIS 5 mg Tab Take 5 mg by mouth Two (2) times a day.     ??? empty container Misc USE AS DIRECTED 1 each 2   ??? furosemide (LASIX) 80 MG tablet Take 80 mg by mouth Two (2) times a day.     ??? hydrOXYchloroQUINE (PLAQUENIL) 200 mg tablet Take 200 mg by mouth daily.     ??? mycophenolate (CELLCEPT) 500 mg tablet Take 3 tablets (1,500 mg total) by mouth two (2) times a day. 540 tablet 3     No current facility-administered medications for this visit.        Changes to medications: [name] reports no changes at this time.    No Known Allergies    Changes to allergies: No    SPECIALTY MEDICATION ADHERENCE     mycophenolate 500 mg: 7-10 days of medicine on hand       Medication Adherence    Patient reported X missed doses in the last month: 0  Specialty Medication: Benlysta  Patient is on additional specialty medications: Yes  Additional Specialty Medications: Mycophenolate   Patient Reported Additional Medication X Missed Doses in the Last Month: 0  Patient is on more than two specialty medications: No  Informant: patient                      Specialty medication(s) dose(s) confirmed: Regimen is correct and unchanged.     Are there any concerns with adherence? No    Adherence counseling provided? Not needed    CLINICAL MANAGEMENT AND INTERVENTION      Clinical Benefit Assessment:    Do you feel the medicine is effective or helping your condition? Yes    Clinical Benefit counseling provided? Not needed    Adverse Effects Assessment:    Are you experiencing any side effects? No    Are you experiencing difficulty administering your medicine? No    Quality of Life Assessment:    Quality of Life    Rheumatology  Oncology  Dermatology  Cystic Fibrosis          How many days over the past month did your SLE  keep you from your normal activities? For example, brushing your teeth or getting up in the morning. 0    Have you discussed this with your provider? Not needed    Acute Infection Status:    Acute infections noted within Epic:  No active infections  Patient reported infection: None    Therapy Appropriateness:    Is therapy appropriate and patient progressing towards  therapeutic goals? Yes, therapy is appropriate and should be continued    DISEASE/MEDICATION-SPECIFIC INFORMATION      For patients on injectable medications: Patient currently has 2 doses left.  Next injection is scheduled for 8/25, 9/1.    PATIENT SPECIFIC NEEDS     - Does the patient have any physical, cognitive, or cultural barriers? No    - Is the patient high risk? No    - Does the patient require a Care Management Plan? No     SOCIAL DETERMINANTS OF HEALTH     At the [facility], we have learned that life circumstances - like trouble affording food, housing, utilities, or transportation can affect the health of many of our patients.   That is why we wanted to ask: are you currently experiencing any life circumstances that are negatively impacting your health and/or quality of life? Patient declined to answer    Social Determinants of Health     Financial Resource Strain: Not on file   Internet Connectivity: Not on file   Food Insecurity: Not on file   Tobacco Use: Low Risk  (03/15/2021)    Patient History    ??? Smoking Tobacco Use: Never    ??? Smokeless Tobacco Use: Never    ??? Passive Exposure: Not on file   Housing/Utilities: Not on file   Alcohol Use: Not on file   Transportation Needs: Not on file   Substance Use: Not on file   Health Literacy: Not on file   Physical Activity: Not on file   Interpersonal Safety: Not on file   Stress: Not on file   Intimate Partner Violence: Not on file   Depression: Not on file   Social Connections: Not on file       Would you be willing to receive help with any of the needs that you have identified today? Not applicable       SHIPPING     Specialty Medication(s) to be Shipped:   Inflammatory Disorders: Benlysta and mycophenolate    Other medication(s) to be shipped: No additional medications requested for fill at this time     Changes to insurance: No    Delivery Scheduled: Yes, Expected medication delivery date: 8/29.     Medication will be delivered via UPS to the confirmed prescription address in [facility].    The patient will receive a drug information handout for each medication shipped and additional FDA Medication Guides as required.  Verified that patient has previously received a Conservation officer, historic buildings and a Surveyor, mining.    The patient or caregiver noted above participated in the development of this care plan and knows that they can request review of or adjustments to the care plan at any time.      All of the patient's questions and concerns have been addressed.    Julianne Rice   [facility] Specialty Pharmacist

## 2021-12-05 MED FILL — MYCOPHENOLATE MOFETIL 500 MG TABLET: ORAL | 30 days supply | Qty: 180 | Fill #10

## 2021-12-05 MED FILL — BENLYSTA 200 MG/ML SUBCUTANEOUS AUTO-INJECTOR: SUBCUTANEOUS | 28 days supply | Qty: 4 | Fill #2

## 2021-12-06 ENCOUNTER — Other Ambulatory Visit: Payer: Self-pay | Admitting: Internal Medicine

## 2021-12-06 DIAGNOSIS — Z79899 Other long term (current) drug therapy: Secondary | ICD-10-CM

## 2021-12-06 DIAGNOSIS — M329 Systemic lupus erythematosus, unspecified: Secondary | ICD-10-CM

## 2021-12-06 DIAGNOSIS — M3214 Glomerular disease in systemic lupus erythematosus: Secondary | ICD-10-CM

## 2021-12-06 MED ORDER — BENLYSTA 200 MG/ML SUBCUTANEOUS AUTO-INJECTOR
SUBCUTANEOUS | 2 refills | 28 days
Start: 2021-12-06 — End: ?

## 2021-12-06 NOTE — Telephone Encounter (Addendum)
Next Visit: 01/06/2022  Last Visit: 09/23/2021  Last Fill: 09/13/2021  DX: SLE (systemic lupus erythematosus related syndrome)   Current Dose per office note 09/23/2021: benlysta 200 mg [place] weekly  Labs: 09/23/2021 CMP WNL except BUN 27 HIGH 09/23/2021 CBC WNL   TB Gold: 03/24/2021   Okay to refill Benlysta?

## 2021-12-07 MED ORDER — BELIMUMAB 200 MG/ML SUBCUTANEOUS AUTO-INJECTOR
SUBCUTANEOUS | 2 refills | 28 days
Start: 2021-12-07 — End: ?

## 2021-12-07 MED ORDER — BENLYSTA 200 MG/ML SUBCUTANEOUS AUTO-INJECTOR
SUBCUTANEOUS | 2 refills | 28 days
Start: 2021-12-07 — End: ?

## 2021-12-23 ENCOUNTER — Ambulatory Visit (HOSPITAL_BASED_OUTPATIENT_CLINIC_OR_DEPARTMENT_OTHER): Payer: Commercial Managed Care - PPO | Admitting: Cardiology

## 2021-12-27 NOTE — Progress Notes (Signed)
Office Visit Note  Patient: Gerald Lloyd             Date of Birth: 09-Nov-1960           MRN: [id number]             PCP: Garnette Gunner, MD Referring: Garnette Gunner, MD Visit Date: 01/06/2022   Subjective:   History of Present Illness: Gerald Lloyd is a 61 y.o. male here for follow up for systemic lupus with lupus nephritis currently on HCQ 200 mg daily MF 1500 mg BID benlysta 200 mg [place] weekly, and prednisone 5 mg daily. He is feeling well today without any new complaints. 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 He remains on eliquis 5 mg BID for anticoagulation. He anticipates next labs visit in about 2 weeks before his follow up with Dr. Marisue Humble. He reports seeing [facility] in [place] with exam for HCQ monitoring which was fine.  Previous HPI 09/23/2021 Gerald Lloyd is a 61 y.o. male here for follow up for systemic lupus with lupus nephritis on HCQ 400 mg daily, MMF 1500 mg BID, Benlysta 200 mg [place] weekly, prednisone 5 mg daily. He is doing well overall without major interval events. He started on low dose lisinopril for continued proteinuria.   Previous HPI 06/24/2021 Gerald Lloyd is a 61 y.o. male here for follow up for systemic lupus with lupus nephritis on HCQ 400 mg daily, cellcept 1500 mg BID, Benlysta 200 mg [place] weekly, prednisone 5 mg daily. He started benlysta with induction dose in January due partial renal response. He has not noticed any problems taking the Benlysta. Leg swelling is doing well.  He has not had any shortness of breath or chest pain.  He does report episodes of burping during the past month these are not every day but are very noticeable when they occur.  He is not having any other problems such as nausea diarrhea abdominal pain or acid reflux.  He had follow-up with Dr. Marisue Humble in the past month reports findings indicated continue to trend in the right direction but still not a complete  renal response.   Previous HPI 03/24/21 Gerald Lloyd is a 61 y.o. male here for follow up for systemic lupus with class IV/V nephritis now on HCQ 400 mg daily, cellcept 1500 mg BID, and prednisone 5 mg daily. Most recent follow up with Dr. Marisue Humble with continued proteinuria partially improving.  He continues having mild bilateral leg swelling no other symptom complaints.   Previous HPI 12/22/20 Gerald Lloyd is a 61 y.o. male with history of lower extremity DVT on eliquis anticoagulation here for new diagnosis systemic lupus.  In February he was evaluated for unilateral left leg swelling with ultrasound exam finding a DVT in the left popliteal vein unprovoked.  There was ongoing local swelling of the left calf for several months.  He then presented to the hospital due to shortness of breath.  Work-up identified bilateral pleural effusions, large circumferential pericardial effusion with tamponade and volume overload, lupus nephritis. He was started with pulse dose steroids, cellcept, and hydroxychloroquine.  And headache very good improvement after starting the high-dose steroids.  He has followed up for this with Dr. Marisue Humble at [facility] on induction therapy for lupus nephritis.  He currently feels that most of his symptoms are much improved he does have a generalized weakness and decreased exertion tolerance ever since his hospital stay. He denies  any particular skin rashes, alopecia, arthritis, oral ulcers, or Raynaud's symptoms.  He does not know of any family history of autoimmune disease.  He has never experienced similar symptoms prior to these events.   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  HENT:  Negative for mouth sores and mouth dryness.   Eyes:  Negative for dryness.  Respiratory:  Negative for shortness of breath.   Cardiovascular:  Negative for chest pain and palpitations.  Gastrointestinal:  Negative for blood in stool, constipation and diarrhea.  Endocrine: Negative for increased urination.  Genitourinary:  Negative for involuntary urination.  Musculoskeletal:  Negative for joint pain, gait problem, joint pain, joint swelling, myalgias, muscle weakness, morning stiffness, muscle tenderness and myalgias.  Skin:  Negative for color change, rash, hair loss and sensitivity to sunlight.  Allergic/Immunologic: Negative for susceptible to infections.  Neurological:  Negative for dizziness and headaches.  Hematological:  Negative for swollen glands.  Psychiatric/Behavioral:  Negative for depressed mood and sleep disturbance. The patient is not nervous/anxious.     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/27/2020   Bilateral pleural effusion 11/25/2020   AKI (acute kidney injury) (HCC) 11/24/2020   Pericardial effusion 11/24/2020   Anemia 11/24/2020   Dyspnea 11/24/2020   Essential hypertension 11/24/2020   Acute on chronic diastolic CHF (congestive heart failure) (HCC) 11/23/2020    Past Medical History:  Diagnosis Date   Anemia    DVT (deep venous thrombosis) (HCC)    Hypertension     Family History  Problem Relation Age of Onset   Breast cancer Mother    Colon cancer Mother    Diabetes Father    Healthy Son    Healthy Son    Past Surgical History:  Procedure Laterality Date   IR THORACENTESIS ASP PLEURAL SPACE W/IMG GUIDE  11/25/2020   IR  THORACENTESIS ASP PLEURAL SPACE W/IMG GUIDE  11/26/2020   Social History   Social History Narrative   Not on file   Immunization History  Administered Date(s) Administered   Moderna Sars-Covid-2 Vaccination 07/30/2019, 12/10/2019, 03/25/2020     Objective: Vital Signs: BP 111/71 (BP Location: Right Arm, Patient Position: Sitting, Cuff Size: Normal)   Pulse 60   Resp 16   Ht 5\' 10"  (1.778 m)   Wt 171 lb 6.4 oz (77.7 kg)   BMI 24.59 kg/m    Physical Exam HENT:     Nose: Nose normal.     Mouth/Throat:     Mouth: Mucous membranes are moist.     Pharynx: Oropharynx is clear.  Cardiovascular:     Rate and Rhythm: Normal rate and regular rhythm.  Pulmonary:     Effort: Pulmonary effort is normal.     Breath sounds: Normal breath sounds.  Skin:    General: Skin is warm and dry.     Comments: Trace pedal edema b/l, hairless lateral side of shins  Neurological:  Mental Status: He is alert.  Psychiatric:        Mood and Affect: Mood normal.      Musculoskeletal Exam:  Shoulders full ROM no tenderness or swelling Elbows full ROM no tenderness or swelling Wrists full ROM no tenderness or swelling Fingers full ROM no tenderness or swelling Knees full ROM no tenderness or swelling Ankles full ROM no tenderness or swelling MTPs full ROM no tenderness or swelling   Investigation: No additional findings.  Imaging: No results found.  Recent Labs: Lab Results  Component Value Date   WBC 8.1 09/23/2021   HGB 13.3 09/23/2021   PLT 376 09/23/2021   NA 139 09/23/2021   K 4.5 09/23/2021   CL 102 09/23/2021   CO2 29 09/23/2021   GLUCOSE 83 09/23/2021   BUN 27 (H) 09/23/2021   CREATININE 1.29 09/23/2021   BILITOT 0.5 09/23/2021   ALKPHOS 61 11/26/2020   AST 13 09/23/2021   ALT 12 09/23/2021   PROT 6.4 09/23/2021   ALBUMIN 1.4 (L) 11/29/2020   CALCIUM 9.5 09/23/2021   QFTBGOLDPLUS NEGATIVE 03/24/2021    Speciality Comments: No specialty comments  available.  Procedures:  No procedures performed Allergies: Patient has no known allergies.   Assessment / Plan:     Visit Diagnoses: SLE (systemic lupus erythematosus related syndrome) (HCC)  Extrarenal symptoms appear completely controlled on current medications. Continuing HCQ 200 mg MMF 1500 mg BID, benlysta 200 mg [place] weekly, and prednisone 5 mg daily.  High risk medication use - HCQ 200 mg daily  Upcoming labs so will just wait and review these, no reported intolerance or infection events. Will need updated quantiferon at next f/u visit. Will request documentation of eye exam.  Lupus nephritis ([place])  Following up with Dr. Joelyn Oms with repeat labs next month. Appears to be stable from volume status.  Acute on chronic diastolic CHF (congestive heart failure) ([place])  Recent cardiology f/u looks good, on diuretics for edema still.  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 Warfarin may be slightly more effective than DOACs for this indication but with one venous event I think continuing current treatment is adequate.  Orders: No orders of the defined types were placed in this encounter.  No orders of the defined types were placed in this encounter.    Follow-Up Instructions: Return in about 3 months (around 04/07/2022) for SLE on HCQ/MMF/BLY/GC f/u 10mos.   Collier Salina, MD  Note - This record has been created using [employer].  Chart creation errors have been sought, but may not always  have been located. Such creation errors do not reflect on  the standard of medical care.

## 2021-12-29 NOTE — Unmapped (Signed)
[facility] Refill Coordination Note    Specialty Medication(s) to be Shipped:   Inflammatory Disorders: Benlysta and mycophenolate500mg     Other medication(s) to be shipped: No additional medications requested for fill at this time     Miguel Sharp, DOB: 1960/08/31  Phone: [phone] (home) [phone] (work)      All above HIPAA information was verified with patient.     Was a Nurse, learning disability used for this call? No    Completed refill call assessment today to schedule patient's medication shipment from the [facility] [phone]).  All relevant notes have been reviewed.     Specialty medication(s) and dose(s) confirmed: Regimen is correct and unchanged.   Changes to medications: Miguel Sharp reports no changes at this time.  Changes to insurance: No  New side effects reported not previously addressed with a pharmacist or physician: None reported  Questions for the pharmacist: No    Confirmed patient received a Conservation officer, historic buildings and a Surveyor, mining with first shipment. The patient will receive a drug information handout for each medication shipped and additional FDA Medication Guides as required.       DISEASE/MEDICATION-SPECIFIC INFORMATION        For patients on injectable medications: Patient currently has 2 doses left.  Next injection is scheduled for 09/22,09/29.    SPECIALTY MEDICATION ADHERENCE     Medication Adherence    Patient reported X missed doses in the last month: 0  Specialty Medication: Benlysta  Patient is on additional specialty medications: Yes  Additional Specialty Medications: Mycophenolate  Patient Reported Additional Medication X Missed Doses in the Last Month: 0  Patient is on more than two specialty medications: No  Any gaps in refill history greater than 2 weeks in the last 3 months: no  Demonstrates understanding of importance of adherence: yes  Informant: patient  Reliability of informant: reliable  Provider-estimated medication adherence level: good  Patient is at risk for Non-Adherence: No  Reasons for non-adherence: no problems identified                  Confirmed plan for next specialty medication refill: delivery by pharmacy  Refills needed for supportive medications: not needed          Refill Coordination    Has the Patients' Contact Information Changed: No  Is the Shipping Address Different: No         Were doses missed due to medication being on hold? No    benlysta 200 mg/ml: 14 days of medicine on hand   Mycophenolate 500 mg: 14 days of medicine on hand       REFERRAL TO PHARMACIST     Referral to the pharmacist: Not needed      [facility]     Shipping address confirmed in Epic.     Delivery Scheduled: Yes, Expected medication delivery date: 09/27.     Medication will be delivered via UPS to the prescription address in Epic WAM.    Antonietta Barcelona   [facility] Specialty Technician

## 2022-01-03 MED FILL — MYCOPHENOLATE MOFETIL 500 MG TABLET: ORAL | 30 days supply | Qty: 180 | Fill #11

## 2022-01-03 MED FILL — BENLYSTA 200 MG/ML SUBCUTANEOUS AUTO-INJECTOR: SUBCUTANEOUS | 28 days supply | Qty: 4 | Fill #0

## 2022-01-04 ENCOUNTER — Encounter (HOSPITAL_BASED_OUTPATIENT_CLINIC_OR_DEPARTMENT_OTHER): Payer: Self-pay | Admitting: Cardiology

## 2022-01-04 ENCOUNTER — Ambulatory Visit (HOSPITAL_BASED_OUTPATIENT_CLINIC_OR_DEPARTMENT_OTHER): Payer: Commercial Managed Care - PPO | Admitting: Cardiology

## 2022-01-04 VITALS — BP 104/72 | HR 58 | Ht 71.0 in | Wt 172.9 lb

## 2022-01-04 DIAGNOSIS — Z86718 Personal history of other venous thrombosis and embolism: Secondary | ICD-10-CM | POA: Diagnosis not present

## 2022-01-04 DIAGNOSIS — I10 Essential (primary) hypertension: Secondary | ICD-10-CM

## 2022-01-04 DIAGNOSIS — M329 Systemic lupus erythematosus, unspecified: Secondary | ICD-10-CM

## 2022-01-04 DIAGNOSIS — M3214 Glomerular disease in systemic lupus erythematosus: Secondary | ICD-10-CM | POA: Diagnosis not present

## 2022-01-04 NOTE — Progress Notes (Signed)
Cardiology Office Note:    Date:  01/04/2022   ID:  Gerald Lloyd, DOB 04-11-1960, MRN [id number]  PCP:  Bonnita Hollow, MD  Cardiologist:  Buford Dresser, MD  Referring MD: Bonnita Hollow, MD   CC: follow up  History of Present Illness:    Gerald Lloyd is a 61 y.o. male with a hx of anemia, DVT, and hypertension, who is seen for follow-up. I met him during this hospitalization in 11/2020. Reviewed hospital course, workup, and medications at length today. Working diagnosis is systemic autoimmune process (possibly lupus) resulting in systemic volume overload, pericardial and pleural effusions.  Today:  He says he has been doing a lot better than he was last year. He reports that he has been tolerating his medications well.  He states he can perform good activity "within reason." He is able to do housework, yard work, and go for walks with relative ease.  In regards to his diet, he has cut out all salt in his diet.  He denies any palpitations, chest pain, shortness of breath, or peripheral edema. No lightheadedness, headaches, syncope, orthopnea, or PND.  Past Medical History:  Diagnosis Date   Anemia    DVT (deep venous thrombosis) ([place])    Hypertension     Past Surgical History:  Procedure Laterality Date   IR THORACENTESIS ASP PLEURAL SPACE W/IMG GUIDE  11/25/2020   IR THORACENTESIS ASP PLEURAL SPACE W/IMG GUIDE  11/26/2020    Current Medications: Current Outpatient Medications on File Prior to Visit  Medication Sig   BENLYSTA 200 MG/ML SOAJ Inject the contents of 1 auto-injector (200 mg total) under the skin every seven (7) days.   ELIQUIS 5 MG TABS tablet Take 1 tablet (5 mg total) by mouth 2 (two) times daily.   furosemide (LASIX) 80 MG tablet Take 80 mg by mouth as needed.   hydroxychloroquine (PLAQUENIL) 200 MG tablet Take 1 tablet (200 mg total) by mouth daily.   lisinopril (ZESTRIL) 2.5 MG tablet Take 2.5 mg by mouth daily. Patient takes half  tablet daily.   mycophenolate (CELLCEPT) 500 MG tablet Take 1,500 mg by mouth 2 (two) times daily. X 1 week, 2nd week 2 tabs in the morning and 2 tabs in evening. 3rd week 3 tabs each morning and 3 each evening.   predniSONE (DELTASONE) 5 MG tablet Take 5 mg by mouth daily.   No current facility-administered medications on file prior to visit.     Allergies:   Patient has no known allergies.   Social History   Tobacco Use   Smoking status: Never    Passive exposure: Never   Smokeless tobacco: Never  Vaping Use   Vaping Use: Never used  Substance Use Topics   Alcohol use: Yes    Comment: Occasionally   Drug use: Never    Family History: Father has polycythemia, stroke in his 86's Paternal Grandfather collapsed from heart attack in his 77's Mother Breast and colon cancer No siblings  ROS:   Please see the history of present illness.   Additional pertinent ROS otherwise unremarkable.  EKGs/Labs/Other Studies Reviewed:    The following studies were reviewed today:  Echo 12/24/2020: 1. Left ventricular ejection fraction, by estimation, is 60 to 65%. The  left ventricle has normal function. The left ventricle has no regional  wall motion abnormalities. Left ventricular diastolic parameters were  normal.   2. Right ventricular systolic function is normal. The right ventricular  size is normal. There is normal pulmonary  artery systolic pressure.   3. A small to moderate (1.0 cm) pericardial effusion is present. The  pericardial effusion is localized near the right atrium. There is also a  trivial collection posteriorly.   4. Mild mitral valve regurgitation.   5. The inferior vena cava is normal in size with greater than 50%  respiratory variability, suggesting right atrial pressure of 3 mmHg.   Comparison(s): Prior images reviewed side by side. There has been  significant improvement although not resolution of the pericardial  effusion.   Echo 11/24/2020: 1. Left  ventricular ejection fraction, by estimation, is 55 to 60%. The  left ventricle has normal function. The left ventricle has no regional  wall motion abnormalities. Left ventricular diastolic parameters are  consistent with Grade II diastolic  dysfunction (pseudonormalization). Elevated left ventricular end-diastolic  pressure.   2. Right ventricular systolic function is normal. The right ventricular  size is normal. There is normal pulmonary artery systolic pressure.   3. Left atrial size was severely dilated.   4. Large pericardial effusion measuring up to 2.3 cm. Right atrial  collapse noted but no right ventricular collapse or significant  respiratory inflow variation. RA pressure 8 mmHg. Findings are consistent  with early tamponade. Recommend serial follow up   and clinical correlation. Large pericardial effusion. The pericardial  effusion is circumferential. There is no evidence of cardiac tamponade.  Moderate pleural effusion in the left lateral region.   5. The mitral valve is normal in structure. No evidence of mitral valve  regurgitation. No evidence of mitral stenosis.   6. The aortic valve is tricuspid. Aortic valve regurgitation is not  visualized. No aortic stenosis is present.   7. Aortic dilatation noted. There is borderline dilatation of the  ascending aorta, measuring 36 mm.   8. The inferior vena cava is dilated in size with >50% respiratory  variability, suggesting right atrial pressure of 8 mmHg.   9. There is a small patent foramen ovale.  EKG:  EKG is personally reviewed. 01/04/22: sinus bradycardia at 58 bpm 04/25/2021: EKG was not ordered. 12/30/2020: NSR at 88 bpm, with LVH/repol pattern   12/02/2020: sinus tachycardia at 115 bpm  Recent Labs: 09/23/2021: ALT 12; BUN 27; Creat 1.29; Hemoglobin 13.3; Platelets 376; Potassium 4.5; Sodium 139   Recent Lipid Panel    Component Value Date/Time   CHOL 201 (H) 11/25/2020 0909   TRIG 140 11/25/2020 0909   HDL 22  (L) 11/25/2020 0909   CHOLHDL 9.1 11/25/2020 0909   VLDL 28 11/25/2020 0909   LDLCALC 151 (H) 11/25/2020 0909    Physical Exam:    VS:  BP 104/72   Pulse (!) 58   Ht '5\' 11"'  (1.803 m)   Wt 172 lb 14.4 oz (78.4 kg)   BMI 24.11 kg/m     Wt Readings from Last 3 Encounters:  01/06/22 171 lb 6.4 oz (77.7 kg)  01/04/22 172 lb 14.4 oz (78.4 kg)  09/23/21 173 lb 6.4 oz (78.7 kg)    GEN: Well nourished, well developed in no acute distress HEENT: Normal, moist mucous membranes NECK: No JVD CARDIAC: regular rhythm, normal S1 and S2, no rubs or gallops. No murmur. VASCULAR: Radial and DP pulses 2+ bilaterally. No carotid bruits RESPIRATORY:  Clear to auscultation without rales, wheezing or rhonchi  ABDOMEN: Soft, non-tender, non-distended MUSCULOSKELETAL:  Ambulates independently SKIN: Warm and dry, no edema NEUROLOGIC:  Alert and oriented x 3. No focal neuro deficits noted. PSYCHIATRIC:  Normal affect  ASSESSMENT:    1. Essential hypertension   2. SLE (systemic lupus erythematosus related syndrome) ([place])   3. Lupus nephritis ([place])   4. History of DVT (deep vein thrombosis)    PLAN:    Lupus, with lupus nephritis Initial presentation with: Anemia, Anasarca/hypoalbuminemia, Acute kidney injury, Pleural effusion, Pericardial effusion -overall he is improving with treatment of his lupus -discussed red flag warning signs that need immediate medical attention -follows closely with Dr. Joelyn Oms at [facility]  DVT: on anticoagulation  Hypertension: -much improved with treatment of his lupus, now off amlodipine and on only lisinopril 2.5 mg daily and lasix 80 mg daily  Plan for follow up: 1 year.   Medication Adjustments/Labs and Tests Ordered: Current medicines are reviewed at length with the patient today.  Concerns regarding medicines are outlined above.   Orders Placed This Encounter  Procedures   EKG 12-Lead   No orders of the defined types were placed in this  encounter.  Patient Instructions  Medication Instructions:  Your physician recommends that you continue on your current medications as directed. Please refer to the Current Medication list given to you today.   *If you need a refill on your cardiac medications before your next appointment, please call your pharmacy*  Lab Work: NONE  Testing/Procedures: NONE  Follow-Up: At [facility], you and your health needs are our priority.  As part of our continuing mission to provide you with exceptional heart care, we have created designated Provider Care Teams.  These Care Teams include your primary Cardiologist (physician) and Advanced Practice Providers (APPs -  Physician Assistants and Nurse Practitioners) who all work together to provide you with the care you need, when you need it.  We recommend signing up for the patient portal called "MyChart".  Sign up information is provided on this After Visit Summary.  MyChart is used to connect with patients for Virtual Visits (Telemedicine).  Patients are able to view lab/test results, encounter notes, upcoming appointments, etc.  Non-urgent messages can be sent to your provider as well.   To learn more about what you can do with MyChart, go to [email or web address].    Your next appointment:   12 month(s)  The format for your next appointment:   In Person  Provider:   Buford Dresser, MD             I,Breanna Adamick,acting as a scribe for Buford Dresser, MD.,have documented all relevant documentation on the behalf of Buford Dresser, MD,as directed by  Buford Dresser, MD while in the presence of Buford Dresser, MD.  I, Buford Dresser, MD, have reviewed all documentation for this visit. The documentation on 01/04/22 for the exam, diagnosis, procedures, and orders are all accurate and complete.   Signed, Buford Dresser, MD PhD 01/04/2022     [place]

## 2022-01-04 NOTE — Patient Instructions (Signed)
Medication Instructions:  Your physician recommends that you continue on your current medications as directed. Please refer to the Current Medication list given to you today.   *If you need a refill on your cardiac medications before your next appointment, please call your pharmacy*  Lab Work: NONE  Testing/Procedures: NONE  Follow-Up: At [facility], you and your health needs are our priority.  As part of our continuing mission to provide you with exceptional heart care, we have created designated Provider Care Teams.  These Care Teams include your primary Cardiologist (physician) and Advanced Practice Providers (APPs -  Physician Assistants and Nurse Practitioners) who all work together to provide you with the care you need, when you need it.  We recommend signing up for the patient portal called "MyChart".  Sign up information is provided on this After Visit Summary.  MyChart is used to connect with patients for Virtual Visits (Telemedicine).  Patients are able to view lab/test results, encounter notes, upcoming appointments, etc.  Non-urgent messages can be sent to your provider as well.   To learn more about what you can do with MyChart, go to [email or web address].    Your next appointment:   12 month(s)  The format for your next appointment:   In Person  Provider:   Bridgette Christopher, MD        

## 2022-01-06 ENCOUNTER — Encounter: Payer: Self-pay | Admitting: Internal Medicine

## 2022-01-06 ENCOUNTER — Ambulatory Visit: Payer: Commercial Managed Care - PPO | Attending: Internal Medicine | Admitting: Internal Medicine

## 2022-01-06 VITALS — BP 111/71 | HR 60 | Resp 16 | Ht 70.0 in | Wt 171.4 lb

## 2022-01-06 DIAGNOSIS — R760 Raised antibody titer: Secondary | ICD-10-CM

## 2022-01-06 DIAGNOSIS — M3214 Glomerular disease in systemic lupus erythematosus: Secondary | ICD-10-CM

## 2022-01-06 DIAGNOSIS — I82432 Acute embolism and thrombosis of left popliteal vein: Secondary | ICD-10-CM

## 2022-01-06 DIAGNOSIS — Z79899 Other long term (current) drug therapy: Secondary | ICD-10-CM

## 2022-01-06 DIAGNOSIS — I5033 Acute on chronic diastolic (congestive) heart failure: Secondary | ICD-10-CM | POA: Diagnosis not present

## 2022-01-06 DIAGNOSIS — M329 Systemic lupus erythematosus, unspecified: Secondary | ICD-10-CM

## 2022-01-11 NOTE — Unmapped (Cosign Needed)
[place] Assessment of Medications Program (CAMP) Clinic-- Medication Adherence Clinic SUCCESSFUL ENGAGEMENT      Miguel Sharp is a 61 y.o. male identified as having an adherence of <80% for a hypertension medication, based on payer reports and chart review. Contacted the Patient to review adherence to the medication(s) described below.    Clinical Medication Adherence Assessment      Medication Assessed #1: AMLDOIPINE   PDC %: 75.5   Chronic Disease State: Hypertension   Source of Data/Request for Medication Adherence Assessment: Payer Report   Does patient indicate they are taking their medication as prescribed?: No   Are prescription instructions in line with the way patient is currently taking this medication?: Yes   Medication Refills Remaining?: No   Date of Last Refill: 07/26/21   Do you currently receive a 90-day supply of this medication?: No   Would you like for [place] to speak to your provider about a 90-day supply?: No   In general do you have any trouble obtaining or affording your medications?: No   Does the patient have any perceived side effects from this medication?: No         What other reasons can you think of that may have caused missed doses or gaps in medication fill history?: Medication was discontinued by provider   Reasons for Non-Adherence: Discontinued by provider   Actions taken for this medication: None     Medication Assessed #2: LISINOPRIL   PDC %: 42.9   Chronic Disease State: Hypertension   Source of Data/Request for Medication Adherence Assessment: Payer Report   Does patient indicate they are taking their medication as prescribed?: Yes   Are prescription instructions in line with the way patient is currently taking this medication?: Yes   Medication Refills Remaining?: Yes   Date of Last Refill: 11/21/21   Do you currently receive a 90-day supply of this medication?: No   Would you like for [place] to speak to your provider about a 90-day supply?: No   In general do you have any trouble obtaining or affording your medications?: No   Does the patient have any perceived side effects from this medication?: No   What other reasons can you think of that may have caused missed doses or gaps in medication fill history?: None   Reasons for Non-Adherence: Other   Patient was on higher dose (5mg ) Medication decreased to (2.5mg )   Actions taken for this medication: None      General Medication Adherence: :   Population: NHP   Contact Source: Patient   Who helps you with your medications?: Manage Myself   What reminder methods or adherence aids do you currently use to take your medications?: None   Thinking about all of your medications, do you have any issues or concerns that we can further assist you with?: No   Would you like to switch to a [facility] in-house pharmacy for a lower copay?: No   Is patient eligible for CAMP VBBD enrollment?: No      Would you be interested in discussing your medication concerns further through a scheduled virtual visit with a pharmacist?: No   Interventions Taken: None              Brantley Naser Jimmey Ralph , CPhT  Certified Pharmacy Technician  [place] Assessment of Medications Program (CAMP)

## 2022-01-25 MED ORDER — MYCOPHENOLATE MOFETIL 500 MG TABLET
ORAL_TABLET | Freq: Two times a day (BID) | ORAL | 3 refills | 90 days
Start: 2022-01-25 — End: ?

## 2022-01-25 NOTE — Unmapped (Signed)
[facility] Refill Coordination Note    Specialty Medication(s) to be Shipped:   Inflammatory Disorders: Benlysta and mycophenolate    Other medication(s) to be shipped: No additional medications requested for fill at this time     Miguel Sharp, DOB: [birth year]  Phone: [phone] (home) [phone] (work)      All above HIPAA information was verified with patient.     Was a Nurse, learning disability used for this call? No    Completed refill call assessment today to schedule patient's medication shipment from the [facility] [phone]).  All relevant notes have been reviewed.     Specialty medication(s) and dose(s) confirmed: Regimen is correct and unchanged.   Changes to medications: Miguel Sharp reports no changes at this time.  Changes to insurance: No  New side effects reported not previously addressed with a pharmacist or physician: None reported  Questions for the pharmacist: No    Confirmed patient received a Conservation officer, historic buildings and a Surveyor, mining with first shipment. The patient will receive a drug information handout for each medication shipped and additional FDA Medication Guides as required.       DISEASE/MEDICATION-SPECIFIC INFORMATION        For patients on injectable medications: Patient currently has 2 doses left.  Next injection is scheduled for 01/27/22.    SPECIALTY MEDICATION ADHERENCE     Medication Adherence    Patient reported X missed doses in the last month: 0  Specialty Medication: Benlysta  Patient is on additional specialty medications: Yes  Additional Specialty Medications: Mycophenolate  Patient Reported Additional Medication X Missed Doses in the Last Month: 0  Patient is on more than two specialty medications: No  Any gaps in refill history greater than 2 weeks in the last 3 months: no  Demonstrates understanding of importance of adherence: yes  Informant: patient  Reliability of informant: reliable  Provider-estimated medication adherence level: good  Patient is at risk for Non-Adherence: No  Reasons for non-adherence: no problems identified                                Were doses missed due to medication being on hold? No    BENLYSTA 200 mg/mL Atin (belimumab)  : 7 days of medicine on hand   mycophenolate 500 mg tablet (CELLCEPT)  : 7 days of medicine on hand        REFERRAL TO PHARMACIST     Referral to the pharmacist: Not needed      [facility]     Shipping address confirmed in Epic.     Delivery Scheduled: Yes, Expected medication delivery date: 02/01/22.  However, Rx request for refills was sent to the provider as there are none remaining.     Medication will be delivered via UPS to the prescription address in Epic WAM.    Miguel Sharp Shared [facility] Specialty Technician

## 2022-01-26 MED ORDER — MYCOPHENOLATE MOFETIL 500 MG TABLET
ORAL_TABLET | Freq: Two times a day (BID) | ORAL | 3 refills | 90.00000 days
Start: 2022-01-26 — End: ?

## 2022-01-30 ENCOUNTER — Encounter (HOSPITAL_BASED_OUTPATIENT_CLINIC_OR_DEPARTMENT_OTHER): Payer: Self-pay | Admitting: Cardiology

## 2022-01-31 DIAGNOSIS — M329 Systemic lupus erythematosus, unspecified: Principal | ICD-10-CM

## 2022-01-31 DIAGNOSIS — M3214 Glomerular disease in systemic lupus erythematosus: Principal | ICD-10-CM

## 2022-01-31 MED FILL — BENLYSTA 200 MG/ML SUBCUTANEOUS AUTO-INJECTOR: SUBCUTANEOUS | 28 days supply | Qty: 4 | Fill #1

## 2022-01-31 MED FILL — MYCOPHENOLATE MOFETIL 500 MG TABLET: ORAL | 30 days supply | Qty: 180 | Fill #0

## 2022-02-23 NOTE — Unmapped (Signed)
[facility] Refill Coordination Note    Specialty Medication(s) to be Shipped:   Inflammatory Disorders: Benlysta and mycophenolate    Other medication(s) to be shipped: No additional medications requested for fill at this time     Miguel Sharp, DOB: 1960/07/13  Phone: [phone] (home) [phone] (work)      All above HIPAA information was verified with patient.     Was a Nurse, learning disability used for this call? No    Completed refill call assessment today to schedule patient's medication shipment from the [facility] [phone]).  All relevant notes have been reviewed.     Specialty medication(s) and dose(s) confirmed: Regimen is correct and unchanged.   Changes to medications: Miguel Sharp reports no changes at this time.  Changes to insurance: No  New side effects reported not previously addressed with a pharmacist or physician: None reported  Questions for the pharmacist: No    Confirmed patient received a Conservation officer, historic buildings and a Surveyor, mining with first shipment. The patient will receive a drug information handout for each medication shipped and additional FDA Medication Guides as required.       DISEASE/MEDICATION-SPECIFIC INFORMATION        For patients on injectable medications: Patient currently has 2 doses left.  Next injection is scheduled for 02/24/22.    SPECIALTY MEDICATION ADHERENCE     Medication Adherence    Patient reported X missed doses in the last month: 0  Specialty Medication: BENLYSTA 200 mg/mL Atin (belimumab)  Patient is on additional specialty medications: Yes  Additional Specialty Medications: mycophenolate 500 mg tablet (CELLCEPT)  Patient Reported Additional Medication X Missed Doses in the Last Month: 0                                Were doses missed due to medication being on hold? No    mycophenolate 500 mg tablet (CELLCEPT)  : 8 days of medicine on hand        REFERRAL TO PHARMACIST     Referral to the pharmacist: Not needed      [facility]     Shipping address confirmed in Epic.     Delivery Scheduled: Yes, Expected medication delivery date: 03/01/22.     Medication will be delivered via UPS to the prescription address in Epic WAM.    Miguel Sharp   [facility] Shared [facility] Specialty Technician

## 2022-02-28 MED FILL — BENLYSTA 200 MG/ML SUBCUTANEOUS AUTO-INJECTOR: SUBCUTANEOUS | 28 days supply | Qty: 4 | Fill #2

## 2022-02-28 MED FILL — MYCOPHENOLATE MOFETIL 500 MG TABLET: ORAL | 30 days supply | Qty: 180 | Fill #1

## 2022-03-24 NOTE — Unmapped (Signed)
[facility] Refill Coordination Note    Specialty Medication(s) to be Shipped:   Inflammatory Disorders: Benlysta and mycophenolate    Other medication(s) to be shipped: No additional medications requested for fill at this time     Miguel Sharp, DOB: 24-Jul-1960  Phone: [phone] (home) [phone] (work)      All above HIPAA information was verified with patient.     Was a Nurse, learning disability used for this call? No    Completed refill call assessment today to schedule patient's medication shipment from the [facility] [phone]).  All relevant notes have been reviewed.     Specialty medication(s) and dose(s) confirmed: Regimen is correct and unchanged.   Changes to medications: [place] reports no changes at this time.  Changes to insurance: No  New side effects reported not previously addressed with a pharmacist or physician: None reported  Questions for the pharmacist: No    Confirmed patient received a Conservation officer, historic buildings and a Surveyor, mining with first shipment. The patient will receive a drug information handout for each medication shipped and additional FDA Medication Guides as required.       DISEASE/MEDICATION-SPECIFIC INFORMATION        For patients on injectable medications: Patient currently has 1 doses left.  Next injection is scheduled for 03/31/22.    SPECIALTY MEDICATION ADHERENCE     Medication Adherence    Patient reported X missed doses in the last month: 0  Specialty Medication: BENLYSTA 200 mg/mL Atin (belimumab)  Patient is on additional specialty medications: Yes  Additional Specialty Medications: mycophenolate 500 mg tablet (CELLCEPT)  Patient Reported Additional Medication X Missed Doses in the Last Month: 0  Patient is on more than two specialty medications: No                                Were doses missed due to medication being on hold? No    mycophenolate 500 mg tablet (CELLCEPT)  : 8 days of medicine on hand        REFERRAL TO PHARMACIST     Referral to the pharmacist: Not needed      [facility]     Shipping address confirmed in Epic.     Delivery Scheduled: Yes, Expected medication delivery date: 03/29/22.     Medication will be delivered via UPS to the prescription address in Epic WAM.    [place] A Kimmora Risenhoover   [facility] Shared [facility] Specialty Technician

## 2022-03-28 ENCOUNTER — Other Ambulatory Visit: Payer: Self-pay | Admitting: Internal Medicine

## 2022-03-28 ENCOUNTER — Other Ambulatory Visit: Payer: Self-pay | Admitting: *Deleted

## 2022-03-28 DIAGNOSIS — Z79899 Other long term (current) drug therapy: Secondary | ICD-10-CM

## 2022-03-28 DIAGNOSIS — M329 Systemic lupus erythematosus, unspecified: Secondary | ICD-10-CM

## 2022-03-28 DIAGNOSIS — M3214 Glomerular disease in systemic lupus erythematosus: Secondary | ICD-10-CM

## 2022-03-28 DIAGNOSIS — Z9225 Personal history of immunosupression therapy: Secondary | ICD-10-CM

## 2022-03-28 DIAGNOSIS — Z111 Encounter for screening for respiratory tuberculosis: Secondary | ICD-10-CM

## 2022-03-28 MED ORDER — BELIMUMAB 200 MG/ML SUBCUTANEOUS AUTO-INJECTOR
SUBCUTANEOUS | 2 refills | 28 days
Start: 2022-03-28 — End: ?

## 2022-03-28 MED FILL — MYCOPHENOLATE MOFETIL 500 MG TABLET: ORAL | 30 days supply | Qty: 180 | Fill #2

## 2022-03-28 NOTE — Unmapped (Signed)
Miguel Sharp 's BENLYSTA 200 mg/mL Atin (belimumab) shipment will be canceled  as a result of no refills remain on the prescription.      I have reached out to the patient  at [phone] and communicated the delivery change. We will not reschedule the medication and have removed this/these medication(s) from the work request.  We have canceled this work request.     Note: Refill request denied: patient should contact provider. Patient is aware and will call his provider. Did not cancel mycophenolate.

## 2022-03-28 NOTE — Progress Notes (Signed)
Patient contacted the office stating that his medication has been declined. Patient advised he is overdue for labs and we can refill once he has updated his labs. Patient requested orders to be released for [facility]. Lab orders released.

## 2022-03-30 ENCOUNTER — Other Ambulatory Visit: Payer: Self-pay | Admitting: *Deleted

## 2022-03-30 DIAGNOSIS — M329 Systemic lupus erythematosus, unspecified: Secondary | ICD-10-CM

## 2022-03-30 DIAGNOSIS — M3214 Glomerular disease in systemic lupus erythematosus: Secondary | ICD-10-CM

## 2022-03-30 DIAGNOSIS — Z79899 Other long term (current) drug therapy: Secondary | ICD-10-CM

## 2022-03-30 MED ORDER — BENLYSTA 200 MG/ML ~~LOC~~ SOAJ: SUBCUTANEOUS | 2 refills | Status: DC

## 2022-03-30 MED ORDER — BENLYSTA 200 MG/ML SUBCUTANEOUS AUTO-INJECTOR
SUBCUTANEOUS | 2 refills | 28 days
Start: 2022-03-30 — End: ?

## 2022-03-30 NOTE — Telephone Encounter (Signed)
Patient called the office requesting a refill on Benlysta  Next Visit: 04/14/2022  Last Visit: 01/06/2022  Last Fill: 12/07/2021  DX:SLE (systemic lupus erythematosus related syndrome)   Current Dose per office note 01/06/2022: benlysta 200 mg [place] weekly   Labs: 03/28/2022 CBC and CMP WNL  TB Gold: 03/24/2021 Neg (updated 03/28/2022, results pending)   Okay to refill Benlysta?

## 2022-04-02 LAB — CMP14+EGFR
ALT: 23 IU/L (ref 0–44)
AST: 18 IU/L (ref 0–40)
Albumin/Globulin Ratio: 2.2 (ref 1.2–2.2)
Albumin: 4.4 g/dL (ref 3.8–4.9)
Alkaline Phosphatase: 85 IU/L (ref 44–121)
BUN/Creatinine Ratio: 19 (ref 9–20)
BUN: 22 mg/dL (ref 6–24)
Bilirubin Total: 0.5 mg/dL (ref 0.0–1.2)
CO2: 24 mmol/L (ref 20–29)
Calcium: 9.5 mg/dL (ref 8.7–10.2)
Chloride: 100 mmol/L (ref 96–106)
Creatinine, Ser: 1.13 mg/dL (ref 0.76–1.27)
Globulin, Total: 2 g/dL (ref 1.5–4.5)
Glucose: 93 mg/dL (ref 70–99)
Potassium: 5.2 mmol/L (ref 3.5–5.2)
Sodium: 138 mmol/L (ref 134–144)
Total Protein: 6.4 g/dL (ref 6.0–8.5)
eGFR: 75 mL/min/{1.73_m2} (ref >59)

## 2022-04-02 LAB — CBC WITH DIFFERENTIAL/PLATELET
Basophils Absolute: 0 10*3/uL (ref 0.0–0.2)
Basos: 0 %
EOS (ABSOLUTE): 0 10*3/uL (ref 0.0–0.4)
Eos: 1 %
Hematocrit: 44.2 % (ref 37.5–51.0)
Hemoglobin: 14.5 g/dL (ref 13.0–17.7)
Immature Grans (Abs): 0 10*3/uL (ref 0.0–0.1)
Immature Granulocytes: 0 %
Lymphocytes Absolute: 0.7 10*3/uL (ref 0.7–3.1)
Lymphs: 8 %
MCH: 28.7 pg (ref 26.6–33.0)
MCHC: 32.8 g/dL (ref 31.5–35.7)
MCV: 88 fL (ref 79–97)
Monocytes Absolute: 0.6 10*3/uL (ref 0.1–0.9)
Monocytes: 7 %
Neutrophils Absolute: 7 10*3/uL (ref 1.4–7.0)
Neutrophils: 84 %
Platelets: 326 10*3/uL (ref 150–450)
RBC: 5.05 x10E6/uL (ref 4.14–5.80)
RDW: 13.1 % (ref 11.6–15.4)
WBC: 8.3 10*3/uL (ref 3.4–10.8)

## 2022-04-02 LAB — QUANTIFERON-TB GOLD PLUS
QuantiFERON Mitogen Value: 1.65 IU/mL
QuantiFERON Nil Value: 0.01 IU/mL
QuantiFERON TB1 Ag Value: 0.01 IU/mL
QuantiFERON TB2 Ag Value: 0.01 IU/mL
QuantiFERON-TB Gold Plus: NEGATIVE

## 2022-04-04 DIAGNOSIS — M329 Systemic lupus erythematosus, unspecified: Principal | ICD-10-CM

## 2022-04-04 DIAGNOSIS — M3214 Glomerular disease in systemic lupus erythematosus: Principal | ICD-10-CM

## 2022-04-06 ENCOUNTER — Telehealth: Payer: Self-pay | Admitting: Pharmacist

## 2022-04-06 NOTE — Telephone Encounter (Signed)
Received fax from [facility] requesting most recent OV notes. Faxed clonicals to [facility] but I went ahead and completed PA renewal today. Completed PA renewal on CMM  Key: [id number]  Chesley Mires, PharmD, MPH, BCPS, CPP Clinical Pharmacist (Rheumatology and Pulmonology)

## 2022-04-07 NOTE — Telephone Encounter (Signed)
Received a fax regarding Prior Authorization from  [facility]  for BENLYSTA. Authorization has been DENIED because proider must submit adequate documentation of disease stability and/or improvement as indicated by the following when compared to baseline: Improvement in SELENA-SLEDAI-2K OR Reducion of baseline BILAG-2004 from A to B or from B to C/D and no BILAG-2004 worsening in other organ systems OR No worsening (<0.3 point increase) in PGA score OR You have seroconverted (negative)  Denial letter has been sent to media tab of patient's chart.  Routing to Dr. Dimple Casey for advisement.  Phone: [phone]  Chesley Mires, PharmD, MPH, BCPS, CPP Clinical Pharmacist (Rheumatology and Pulmonology)

## 2022-04-12 NOTE — Unmapped (Signed)
[facility] Refill Coordination Note    Specialty Medication(s) to be Shipped:   Inflammatory Disorders: Benlysta    Other medication(s) to be shipped: No additional medications requested for fill at this time     Miguel Sharp, DOB: November 05, 1960  Phone: [phone] (home) [phone] (work)      All above HIPAA information was verified with patient.     Was a Nurse, learning disability used for this call? No    Completed refill call assessment today to schedule patient's medication shipment from the [facility] [phone]).  All relevant notes have been reviewed.     Specialty medication(s) and dose(s) confirmed: Regimen is correct and unchanged.   Changes to medications: Kern reports no changes at this time.  Changes to insurance: No  New side effects reported not previously addressed with a pharmacist or physician: None reported  Questions for the pharmacist: No    Confirmed patient received a Conservation officer, historic buildings and a Surveyor, mining with first shipment. The patient will receive a drug information handout for each medication shipped and additional FDA Medication Guides as required.       DISEASE/MEDICATION-SPECIFIC INFORMATION        For patients on injectable medications: Patient currently has 0 doses left.  Next injection is scheduled for 04/14/22.    SPECIALTY MEDICATION ADHERENCE     Medication Adherence    Patient reported X missed doses in the last month: 0  Specialty Medication: BENLYSTA 200 mg/mL Atin (belimumab)  Patient is on additional specialty medications: No  Patient is on more than two specialty medications: No                                Were doses missed due to medication being on hold? No         REFERRAL TO PHARMACIST     Referral to the pharmacist: Not needed      [facility]     Shipping address confirmed in Epic.     Delivery Scheduled: Yes, Expected medication delivery date: 04/14/22.     Medication will be delivered via UPS to the prescription address in Epic WAM.    [name]   [facility] Specialty Technician

## 2022-04-12 NOTE — Telephone Encounter (Signed)
He would not be "seroconverted" as most recent dsDNA Ab titer remains fairly positive.  Based on review of clinic notes and lab results SLEDAI-2K score decreased from 19 March 2021, to 6 last June, to 2 last October. He started [place] in January last year.

## 2022-04-13 NOTE — Unmapped (Signed)
Miguel Sharp 's BENLYSTA 200 mg/mL Atin (belimumab) shipment will be delayed as a result of prior authorization being required by the patient's insurance.     I have reached out to the patient  at [phone] and communicated the delay. We will call the patient back to reschedule the delivery upon resolution. We have not confirmed the new delivery date.

## 2022-04-14 ENCOUNTER — Ambulatory Visit: Payer: Commercial Managed Care - PPO | Admitting: Internal Medicine

## 2022-04-19 ENCOUNTER — Telehealth: Payer: Self-pay | Admitting: *Deleted

## 2022-04-19 NOTE — Telephone Encounter (Signed)
I think that would be fine. 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  FYI- My last updated I sent a response to [facility] on January 3rd regarding the missing documentation point cited in the PA denial for them to appeal this.

## 2022-04-19 NOTE — Telephone Encounter (Signed)
Patient called the office to follow up on his Benlysta PA. Patient advised we have not received the update at this time. Patient would like to know if he can have a sample of Benlysta while waiting. Please advise.

## 2022-04-19 NOTE — Telephone Encounter (Signed)
Attempted to contact the patient and left message advising we would be able to provide a sample for him. Sample reserved in the fridge for patient.

## 2022-04-20 NOTE — Telephone Encounter (Signed)
Submitted an URGENT appeal to  [facility]  for [place]. Estimated turnaround time is 72 hours  Request ID # [id number] Phone: [phone] Fax: [phone]  Knox Saliva, PharmD, MPH, BCPS, CPP Clinical Pharmacist (Rheumatology and Pulmonology)

## 2022-04-21 NOTE — Telephone Encounter (Signed)
Received notification from  [facility]  regarding a prior authorization for [place]. Denial has been OVERTURNED. Authorization has been APPROVED from 04/20/2022 to 04/21/2022. Approval letter sent to scan center.  Patient must continue to fill through  [place] # [id number]  Left VM to advise of approval and that he may call pharmacy to schedule refill.  Knox Saliva, PharmD, MPH, BCPS, CPP Clinical Pharmacist (Rheumatology and Pulmonology)

## 2022-04-22 NOTE — Unmapped (Signed)
Rescheduled Caprice Kluver delivery for 04/25/22

## 2022-04-25 MED FILL — BENLYSTA 200 MG/ML SUBCUTANEOUS AUTO-INJECTOR: SUBCUTANEOUS | 28 days supply | Qty: 4 | Fill #0

## 2022-05-16 NOTE — Unmapped (Signed)
[facility] Refill Coordination Note    Specialty Medication(s) to be Shipped:   Inflammatory Disorders: Benlysta and mycophenolate    Other medication(s) to be shipped: No additional medications requested for fill at this time     Miguel Sharp, DOB: 10/17/1960  Phone: [phone] (home) [phone] (work)      All above HIPAA information was verified with patient.     Was a Nurse, learning disability used for this call? No    Completed refill call assessment today to schedule patient's medication shipment from the [facility] [phone]).  All relevant notes have been reviewed.     Specialty medication(s) and dose(s) confirmed: Regimen is correct and unchanged.   Changes to medications: Lorraine reports no changes at this time.  Changes to insurance: No  New side effects reported not previously addressed with a pharmacist or physician: None reported  Questions for the pharmacist: No    Confirmed patient received a Conservation officer, historic buildings and a Surveyor, mining with first shipment. The patient will receive a drug information handout for each medication shipped and additional FDA Medication Guides as required.       DISEASE/MEDICATION-SPECIFIC INFORMATION        For patients on injectable medications: Patient currently has 0 doses left.  Next injection is scheduled for 05/20/22.    SPECIALTY MEDICATION ADHERENCE     Medication Adherence    Patient reported X missed doses in the last month: 0  Specialty Medication: BENLYSTA 200 mg/mL Atin (belimumab)  Patient is on additional specialty medications: Yes  Additional Specialty Medications: mycophenolate 500 mg tablet (CELLCEPT)  Patient Reported Additional Medication X Missed Doses in the Last Month: 0  Patient is on more than two specialty medications: No                                Were doses missed due to medication being on hold? No      REFERRAL TO PHARMACIST     Referral to the pharmacist: Not needed      [facility] Shipping address confirmed in Epic.     Delivery Scheduled: Yes, Expected medication delivery date: 05/19/22.     Medication will be delivered via UPS to the prescription address in Epic WAM.    Quintella Reichert   [facility] Specialty Technician

## 2022-05-18 MED FILL — BENLYSTA 200 MG/ML SUBCUTANEOUS AUTO-INJECTOR: SUBCUTANEOUS | 28 days supply | Qty: 4 | Fill #1

## 2022-05-18 MED FILL — MYCOPHENOLATE MOFETIL 500 MG TABLET: ORAL | 30 days supply | Qty: 180 | Fill #3

## 2022-05-21 NOTE — Progress Notes (Unsigned)
Office Visit Note  Patient: Gerald Lloyd             Date of Birth: October 25, 1960           MRN: [id number]             PCP: Bonnita Hollow, MD Referring: Bonnita Hollow, MD Visit Date: 05/22/2022   Subjective:  No chief complaint on file.   History of Present Illness: Gerald Lloyd is a 62 y.o. male here for follow up ***   Previous HPI 01/06/22 Gerald Lloyd is a 62 y.o. male here for follow up for systemic lupus with lupus nephritis currently on HCQ 200 mg daily MF 1500 mg BID benlysta 200 mg [place] weekly, and prednisone 5 mg daily. He is feeling well today without any new complaints. 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 He remains on eliquis 5 mg BID for anticoagulation. He anticipates next labs visit in about 2 weeks before his follow up with Dr. Joelyn Oms. He reports seeing [facility] in [place] with exam for HCQ monitoring which was fine.   Previous HPI 09/23/2021 Gerald Lloyd is a 62 y.o. male here for follow up for systemic lupus with lupus nephritis on HCQ 400 mg daily, MMF 1500 mg BID, Benlysta 200 mg [place]amay weekly, prednisone 5 mg daily. He is doing well overall without major interval events. He started on low dose lisinopril for continued proteinuria.   Previous HPI 06/24/2021 Gerald Lloyd is a 62 y.o. male here for follow up for systemic lupus with lupus nephritis on HCQ 400 mg daily, cellcept 1500 mg BID, Benlysta 200 mg [place] weekly, prednisone 5 mg daily. He started benlysta with induction dose in January due partial renal response. He has not noticed any problems taking the Benlysta. Leg swelling is doing well.  He has not had any shortness of breath or chest pain.  He does report episodes of burping during the past month these are not every day but are very noticeable when they occur.  He is not having any other problems such as nausea diarrhea abdominal pain or acid reflux.  He had follow-up with Dr.  Joelyn Oms in the past month reports findings indicated continue to trend in the right direction but still not a complete renal response.   Previous HPI 03/24/21 Gerald Lloyd is a 62 y.o. male here for follow up for systemic lupus with class IV/V nephritis now on HCQ 400 mg daily, cellcept 1500 mg BID, and prednisone 5 mg daily. Most recent follow up with Dr. Joelyn Oms with continued proteinuria partially improving.  He continues having mild bilateral leg swelling no other symptom complaints.   Previous HPI 12/22/20 Gerald Lloyd is a 62 y.o. male with history of lower extremity DVT on eliquis anticoagulation here for new diagnosis systemic lupus.  In February he was evaluated for unilateral left leg swelling with ultrasound exam finding a DVT in the left popliteal vein unprovoked.  There was ongoing local swelling of the left calf for several months.  He then presented to the hospital due to shortness of breath.  Work-up identified bilateral pleural effusions, large circumferential pericardial effusion with tamponade and volume overload, lupus nephritis. He was started with pulse dose steroids, cellcept, and hydroxychloroquine.  And headache very good improvement after starting the high-dose steroids.  He has followed up for this with Dr. Joelyn Oms at [facility] on induction therapy for lupus nephritis.  He currently feels that  most of his symptoms are much improved he does have a generalized weakness and decreased exertion tolerance ever since his hospital stay. He denies any particular skin rashes, alopecia, arthritis, oral ulcers, or Raynaud's symptoms.  He does not know of any family history of autoimmune disease.  He has never experienced similar symptoms prior to these events.   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   High risk medication use 03/24/2021   Left leg DVT ([place]) 12/22/2020   SLE (systemic lupus erythematosus related syndrome) ([place]) 11/27/2020   Lupus nephritis ([place]) 11/27/2020   Bilateral pleural effusion 11/25/2020   AKI (acute kidney injury) ([place]) 11/24/2020   Pericardial effusion 11/24/2020   Anemia 11/24/2020   Dyspnea 11/24/2020   Essential hypertension 11/24/2020   Acute on chronic diastolic CHF (congestive heart failure) ([place]) 11/23/2020    Past Medical History:  Diagnosis Date   Anemia    DVT (deep venous thrombosis) ([place])    Hypertension     Family History  Problem Relation Age of Onset   Breast cancer Mother    Colon cancer Mother    Diabetes Father    Healthy Son    Healthy Son    Past Surgical History:  Procedure Laterality Date   IR THORACENTESIS ASP PLEURAL SPACE W/IMG GUIDE  11/25/2020   IR THORACENTESIS ASP PLEURAL SPACE W/IMG GUIDE  11/26/2020   Social History   Social History Narrative   Not on file   Immunization History  Administered Date(s) Administered   Moderna Sars-Covid-2 Vaccination 07/30/2019, 12/10/2019, 03/25/2020     Objective: Vital Signs: There were no vitals taken for this visit.   Physical Exam   Musculoskeletal Exam: ***  CDAI Exam: CDAI Score: -- Patient Global: --; Provider Global: -- Swollen: --; Tender: -- Joint Exam 05/22/2022   No joint exam has been documented for this visit   There is currently no information documented on the homunculus. Go to the Rheumatology activity and complete the homunculus joint exam.  Investigation: No additional findings.  Imaging: No results found.  Recent Labs: Lab Results  Component Value  Date   WBC 8.3 03/28/2022   HGB 14.5 03/28/2022   PLT 326 03/28/2022   NA 138 03/28/2022   K 5.2 03/28/2022   CL 100 03/28/2022   CO2 24 03/28/2022   GLUCOSE 93 03/28/2022   BUN 22 03/28/2022   CREATININE 1.13 03/28/2022   BILITOT 0.5 03/28/2022   ALKPHOS 85 03/28/2022   AST 18 03/28/2022   ALT 23 03/28/2022   PROT 6.4 03/28/2022   ALBUMIN 4.4 03/28/2022   CALCIUM 9.5 03/28/2022   QFTBGOLDPLUS Negative 03/28/2022    Speciality Comments: PLQ Eye Exam 05/27/2021 normal [facility], [place] Noss, normal f/u 12 months  Procedures:  No procedures performed Allergies: Patient has no known allergies.   Assessment / Plan:     Visit Diagnoses: No diagnosis found.  ***  Orders: No orders of the defined types were placed in this encounter.  No orders of the defined types were placed in this encounter.    Follow-Up Instructions:  No follow-ups on file.   Collier Salina, MD  Note - This record has been created using [employer].  Chart creation errors have been sought, but may not always  have been located. Such creation errors do not reflect on  the standard of medical care.

## 2022-05-22 ENCOUNTER — Ambulatory Visit: Payer: BC Managed Care – PPO | Attending: Internal Medicine | Admitting: Internal Medicine

## 2022-05-22 ENCOUNTER — Encounter: Payer: Self-pay | Admitting: Internal Medicine

## 2022-05-22 VITALS — BP 108/74 | HR 55 | Resp 12 | Ht 70.0 in | Wt 178.0 lb

## 2022-05-22 DIAGNOSIS — Z79899 Other long term (current) drug therapy: Secondary | ICD-10-CM

## 2022-05-22 DIAGNOSIS — M329 Systemic lupus erythematosus, unspecified: Secondary | ICD-10-CM | POA: Diagnosis not present

## 2022-05-22 DIAGNOSIS — M3214 Glomerular disease in systemic lupus erythematosus: Secondary | ICD-10-CM

## 2022-06-08 NOTE — Unmapped (Signed)
[facility] Refill Coordination Note    Specialty Medication(s) to be Shipped:   Inflammatory Disorders: Benlysta and mycophenolate    Other medication(s) to be shipped: No additional medications requested for fill at this time     Miguel Sharp, DOB: 1960/08/26  Phone: [phone] (home) [phone] (work)      All above HIPAA information was verified with patient.     Was a Nurse, learning disability used for this call? No    Completed refill call assessment today to schedule patient's medication shipment from the [facility] [phone]).  All relevant notes have been reviewed.     Specialty medication(s) and dose(s) confirmed: Regimen is correct and unchanged.   Changes to medications: Adnan reports no changes at this time.  Changes to insurance: No  New side effects reported not previously addressed with a pharmacist or physician: None reported  Questions for the pharmacist: No    Confirmed patient received a Conservation officer, historic buildings and a Surveyor, mining with first shipment. The patient will receive a drug information handout for each medication shipped and additional FDA Medication Guides as required.       DISEASE/MEDICATION-SPECIFIC INFORMATION        For patients on injectable medications: Patient currently has 1 doses left.  Next injection is scheduled for 06/09/22.    SPECIALTY MEDICATION ADHERENCE              Were doses missed due to medication being on hold? No    Benlysta 200 mg/ml: 10 days of medicine on hand   mycophenolate 500 mg: 10 days of medicine on hand       REFERRAL TO PHARMACIST     Referral to the pharmacist: Not needed      [facility]     Shipping address confirmed in Epic.     Patient was notified of new phone menu : Yes    Delivery Scheduled: Yes, Expected medication delivery date: 06/16/22.     Medication will be delivered via UPS to the prescription address in Epic WAM.    Sherral Hammers, PharmD   [facility] Specialty Pharmacist

## 2022-06-15 MED FILL — MYCOPHENOLATE MOFETIL 500 MG TABLET: ORAL | 30 days supply | Qty: 180 | Fill #4

## 2022-06-15 MED FILL — BENLYSTA 200 MG/ML SUBCUTANEOUS AUTO-INJECTOR: SUBCUTANEOUS | 28 days supply | Qty: 4 | Fill #2

## 2022-06-30 ENCOUNTER — Other Ambulatory Visit: Payer: Self-pay | Admitting: Internal Medicine

## 2022-06-30 ENCOUNTER — Encounter: Payer: Self-pay | Admitting: *Deleted

## 2022-06-30 ENCOUNTER — Other Ambulatory Visit: Payer: Self-pay | Admitting: *Deleted

## 2022-06-30 DIAGNOSIS — Z79899 Other long term (current) drug therapy: Secondary | ICD-10-CM

## 2022-06-30 DIAGNOSIS — M3214 Glomerular disease in systemic lupus erythematosus: Secondary | ICD-10-CM

## 2022-06-30 DIAGNOSIS — M329 Systemic lupus erythematosus, unspecified: Secondary | ICD-10-CM

## 2022-06-30 MED ORDER — BELIMUMAB 200 MG/ML SUBCUTANEOUS AUTO-INJECTOR
SUBCUTANEOUS | 2 refills | 28 days
Start: 2022-06-30 — End: ?

## 2022-06-30 MED ORDER — BENLYSTA 200 MG/ML SUBCUTANEOUS AUTO-INJECTOR
SUBCUTANEOUS | 2 refills | 28 days
Start: 2022-06-30 — End: ?

## 2022-06-30 NOTE — Telephone Encounter (Signed)
Last Fill: 03/30/2022  Labs: 03/28/2022 CBC/CMP WNL  TB Gold: 03/28/2022 Neg    Next Visit: 08/22/2022  Last Visit: 05/22/2022  DX:SLE (systemic lupus erythematosus related syndrome)   Current Dose per office note 05/22/2022: benlysta 200 mg  weekly,   Sent message to patient via my chart to advise patient he is due to update labs.   Okay to refill Benlysta?

## 2022-07-01 LAB — CBC WITH DIFFERENTIAL/PLATELET
Basophils Absolute: 0 10*3/uL (ref 0.0–0.2)
Basos: 0 %
EOS (ABSOLUTE): 0 10*3/uL (ref 0.0–0.4)
Eos: 0 %
Hematocrit: 45.7 % (ref 37.5–51.0)
Hemoglobin: 14.8 g/dL (ref 13.0–17.7)
Immature Grans (Abs): 0 10*3/uL (ref 0.0–0.1)
Immature Granulocytes: 0 %
Lymphocytes Absolute: 0.9 10*3/uL (ref 0.7–3.1)
Lymphs: 9 %
MCH: 28.4 pg (ref 26.6–33.0)
MCHC: 32.4 g/dL (ref 31.5–35.7)
MCV: 88 fL (ref 79–97)
Monocytes Absolute: 0.8 10*3/uL (ref 0.1–0.9)
Monocytes: 8 %
Neutrophils Absolute: 8.2 10*3/uL — ABNORMAL HIGH (ref 1.4–7.0)
Neutrophils: 83 %
Platelets: 332 10*3/uL (ref 150–450)
RBC: 5.21 x10E6/uL (ref 4.14–5.80)
RDW: 13.2 % (ref 11.6–15.4)
WBC: 9.9 10*3/uL (ref 3.4–10.8)

## 2022-07-01 LAB — CMP14+EGFR
ALT: 24 IU/L (ref 0–44)
AST: 17 IU/L (ref 0–40)
Albumin/Globulin Ratio: 2.4 — ABNORMAL HIGH (ref 1.2–2.2)
Albumin: 4.6 g/dL (ref 3.8–4.9)
Alkaline Phosphatase: 94 IU/L (ref 44–121)
BUN/Creatinine Ratio: 23 — ABNORMAL HIGH (ref 9–20)
BUN: 24 mg/dL (ref 6–24)
Bilirubin Total: 0.3 mg/dL (ref 0.0–1.2)
CO2: 28 mmol/L (ref 20–29)
Calcium: 9.5 mg/dL (ref 8.7–10.2)
Chloride: 99 mmol/L (ref 96–106)
Creatinine, Ser: 1.05 mg/dL (ref 0.76–1.27)
Globulin, Total: 1.9 g/dL (ref 1.5–4.5)
Glucose: 82 mg/dL (ref 70–99)
Potassium: 5.5 mmol/L — ABNORMAL HIGH (ref 3.5–5.2)
Sodium: 138 mmol/L (ref 134–144)
Total Protein: 6.5 g/dL (ref 6.0–8.5)
eGFR: 82 mL/min/{1.73_m2} (ref >59)

## 2022-07-04 ENCOUNTER — Telehealth: Payer: Self-pay

## 2022-07-04 NOTE — Progress Notes (Signed)
The lab results look okay.  Potassium is mildly elevated at 5.5 this is an electrolyte can be associated with muscle cramping or in severe cases heart arrhythmia but this is a pretty mild variation outside normal.  Make sure he is not taking any food additives or supplements with potassium. Neutrophils are 1 type of white blood cell his number is just mildly elevated and total white blood cell count is normal.  I do not see any change needed with current lupus medication.

## 2022-07-04 NOTE — Telephone Encounter (Signed)
Patient contacted the office and states he would like Dr.Rice to review his lab results from 06/30/2022. Patient states he does not understand what the lab results mean. Please advise. Patient's call back number is [phone].

## 2022-07-10 NOTE — Unmapped (Signed)
[facility] Refill Coordination Note    Specialty Medication(s) to be Shipped:   Inflammatory Disorders: Benlysta and mycophenolate    Other medication(s) to be shipped: No additional medications requested for fill at this time     Miguel Sharp, DOB: July 16, 1960  Phone: [phone] (home) [phone] (work)      All above HIPAA information was verified with patient.     Was a Nurse, learning disability used for this call? No    Completed refill call assessment today to schedule patient's medication shipment from the [facility] [phone]).  All relevant notes have been reviewed.     Specialty medication(s) and dose(s) confirmed: Regimen is correct and unchanged.   Changes to medications: Geordi reports no changes at this time.  Changes to insurance: No  New side effects reported not previously addressed with a pharmacist or physician: None reported  Questions for the pharmacist: No    Confirmed patient received a Conservation officer, historic buildings and a Surveyor, mining with first shipment. The patient will receive a drug information handout for each medication shipped and additional FDA Medication Guides as required.       DISEASE/MEDICATION-SPECIFIC INFORMATION        For patients on injectable medications: Patient currently has 1 doses left.  Next injection is scheduled for 07/14/22.    SPECIALTY MEDICATION ADHERENCE              Were doses missed due to medication being on hold? No    Benlysta 200 mg/ml: 10 days of medicine on hand   Mycophenolate 500 mg: 10 days of medicine on hand       REFERRAL TO PHARMACIST     Referral to the pharmacist: Not needed      [facility]     Shipping address confirmed in Epic.     Patient was notified of new phone menu : Yes    Delivery Scheduled: Yes, Expected medication delivery date: 07/12/22.     Medication will be delivered via UPS to the prescription address in Epic WAM.    Sherral Hammers, PharmD   [facility] Specialty Pharmacist

## 2022-07-11 MED FILL — BENLYSTA 200 MG/ML SUBCUTANEOUS AUTO-INJECTOR: SUBCUTANEOUS | 28 days supply | Qty: 4 | Fill #0

## 2022-07-11 MED FILL — MYCOPHENOLATE MOFETIL 500 MG TABLET: ORAL | 30 days supply | Qty: 180 | Fill #5

## 2022-08-01 NOTE — Unmapped (Signed)
[facility] Refill Coordination Note    Specialty Medication(s) to be Shipped:   Inflammatory Disorders: Benlysta and Transplant: mycophenolate mofetil 500mg     Other medication(s) to be shipped: No additional medications requested for fill at this time     Miguel Sharp, DOB: [birth year]  Phone: [phone] (home) [phone] (work)      All above HIPAA information was verified with patient.     Was a Nurse, learning disability used for this call? No    Completed refill call assessment today to schedule patient's medication shipment from the [facility] [phone]).  All relevant notes have been reviewed.     Specialty medication(s) and dose(s) confirmed: Regimen is correct and unchanged.   Changes to medications: Miguel Sharp reports no changes at this time.  Changes to insurance: No  New side effects reported not previously addressed with a pharmacist or physician: None reported  Questions for the pharmacist: No    Confirmed patient received a Conservation officer, historic buildings and a Surveyor, mining with first shipment. The patient will receive a drug information handout for each medication shipped and additional FDA Medication Guides as required.       DISEASE/MEDICATION-SPECIFIC INFORMATION        For patients on injectable medications: Patient currently has 2 doses left.  Next injection is scheduled for 08/04/22.    SPECIALTY MEDICATION ADHERENCE     Medication Adherence    Patient reported X missed doses in the last month: 0  Specialty Medication: BENLYSTA 200 mg/mL Atin (belimumab)  Patient is on additional specialty medications: Yes  Additional Specialty Medications: mycophenolate 500 mg tablet (CELLCEPT)  Patient Reported Additional Medication X Missed Doses in the Last Month: 0  Patient is on more than two specialty medications: No  Informant: patient              Were doses missed due to medication being on hold? No    Benlysta 200 mg/ml: 14 days of medicine on hand   Mycophenolate 500 mg: 10 days of medicine on hand       REFERRAL TO PHARMACIST     Referral to the pharmacist: Not needed      [facility]     Shipping address confirmed in Epic.       Delivery Scheduled: Yes, Expected medication delivery date: 08/09/22.     Medication will be delivered via UPS to the prescription address in Epic WAM.    Miguel Sharp   [facility] Specialty Technician

## 2022-08-08 MED FILL — BENLYSTA 200 MG/ML SUBCUTANEOUS AUTO-INJECTOR: SUBCUTANEOUS | 28 days supply | Qty: 4 | Fill #1

## 2022-08-08 MED FILL — MYCOPHENOLATE MOFETIL 500 MG TABLET: ORAL | 30 days supply | Qty: 180 | Fill #6

## 2022-08-22 ENCOUNTER — Ambulatory Visit: Payer: BC Managed Care – PPO | Attending: Internal Medicine | Admitting: Internal Medicine

## 2022-08-22 ENCOUNTER — Encounter: Payer: Self-pay | Admitting: Internal Medicine

## 2022-08-22 VITALS — BP 94/63 | HR 55 | Resp 14 | Ht 70.0 in | Wt 174.0 lb

## 2022-08-22 DIAGNOSIS — M329 Systemic lupus erythematosus, unspecified: Secondary | ICD-10-CM

## 2022-08-22 DIAGNOSIS — M3214 Glomerular disease in systemic lupus erythematosus: Secondary | ICD-10-CM

## 2022-08-22 DIAGNOSIS — Z79899 Other long term (current) drug therapy: Secondary | ICD-10-CM

## 2022-08-22 NOTE — Progress Notes (Signed)
Office Visit Note  Patient: Gerald Lloyd             Date of Birth: [birth year]           MRN: [id number]             PCP: Garnette Gunner, MD Referring: Garnette Gunner, MD Visit Date: 08/22/2022   Subjective:  Follow-up   History of Present Illness: Gerald Lloyd is a 62 y.o. male here for follow up for SLE with class IV/V nephritis on hydroxychloroquine 200 mg, mycophenolate 1500 mg twice daily, Benlysta 200 mg subcu weekly, and prednisone 5 mg daily.  Since her last visit he has had no significant change in symptoms.  Still notices very easy bruising and spots appearing on his forearms and shins otherwise no new issues.  Discussed possible medication changes with Dr. Verna Czech with upcoming appointment in the next few months with his low blood pressure he does get some lightheadedness or dizziness if standing up too quickly.  Previous HPI 05/22/22 Gerald Lloyd is a 62 y.o. male here for follow up for SLE with class IV/V nephritis on HCQ 200 mg, MMF 1500 mg BID, benlysta 200 mg [place] weekly, and prednisone 5 mg daily. He has not experienced any increase in symptoms. No significant leg swelling no shortness of breath or large weight change. He has easy bruising on eliquis treatment. He saw Dr. Marisue Humble last week for follow up with labs from 2 weeks ago. No change in treatment recommended based on renal parameters anticipates aiming for 2 years of treatment response before tapering.   Previous HPI 01/06/22 Gerald Lloyd is a 62 y.o. male here for follow up for systemic lupus with lupus nephritis currently on HCQ 200 mg daily MF 1500 mg BID benlysta 200 mg [place] weekly, and prednisone 5 mg daily. He is feeling well today without any new complaints. 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 He remains on eliquis 5 mg BID for anticoagulation. He anticipates next labs visit in about 2 weeks before his follow up with Dr. Marisue Humble. He reports  seeing [facility] in [place] with exam for HCQ monitoring which was fine.   Previous HPI 09/23/2021 Gerald Lloyd is a 62 y.o. male here for follow up for systemic lupus with lupus nephritis on HCQ 400 mg daily, MMF 1500 mg BID, Benlysta 200 mg [place] weekly, prednisone 5 mg daily. He is doing well overall without major interval events. He started on low dose lisinopril for continued proteinuria.   Previous HPI 06/24/2021 Gerald Lloyd is a 62 y.o. male here for follow up for systemic lupus with lupus nephritis on HCQ 400 mg daily, cellcept 1500 mg BID, Benlysta 200 mg  weekly, prednisone 5 mg daily. He started benlysta with induction dose in January due partial renal response. He has not noticed any problems taking the Benlysta. Leg swelling is doing well.  He has not had any shortness of breath or chest pain.  He does report episodes of burping during the past month these are not every day but are very noticeable when they occur.  He is not having any other problems such as nausea diarrhea abdominal pain or acid reflux.  He had follow-up with Dr. Marisue Humble in the past month reports findings indicated continue to trend in the right direction but still not a complete renal response.   Previous HPI 03/24/21 Gerald Lloyd is a 62 y.o. male here for follow up  for systemic lupus with class IV/V nephritis now on HCQ 400 mg daily, cellcept 1500 mg BID, and prednisone 5 mg daily. Most recent follow up with Dr. Marisue Humble with continued proteinuria partially improving.  He continues having mild bilateral leg swelling no other symptom complaints.   Previous HPI 12/22/20 Gerald Lloyd is a 62 y.o. male with history of lower extremity DVT on eliquis anticoagulation here for new diagnosis systemic lupus.  In February he was evaluated for unilateral left leg swelling with ultrasound exam finding a DVT in the left popliteal vein unprovoked.  There was ongoing local swelling of the left calf for several  months.  He then presented to the hospital due to shortness of breath.  Work-up identified bilateral pleural effusions, large circumferential pericardial effusion with tamponade and volume overload, lupus nephritis. He was started with pulse dose steroids, cellcept, and hydroxychloroquine.  And headache very good improvement after starting the high-dose steroids.  He has followed up for this with Dr. Marisue Humble at [facility] on induction therapy for lupus nephritis.  He currently feels that most of his symptoms are much improved he does have a generalized weakness and decreased exertion tolerance ever since his hospital stay. He denies any particular skin rashes, alopecia, arthritis, oral ulcers, or Raynaud's symptoms.  He does not know of any family history of autoimmune disease.  He has never experienced similar symptoms prior to these events.   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  HENT:  Negative for mouth sores and mouth dryness.   Eyes:  Negative for dryness.  Respiratory:  Negative for shortness of breath.   Cardiovascular:  Negative for chest pain and palpitations.  Gastrointestinal:  Negative for blood in stool, constipation and diarrhea.  Endocrine: Negative for increased urination.  Genitourinary:  Negative for involuntary urination.  Musculoskeletal:  Negative for joint pain, gait problem, joint pain, joint swelling, myalgias, muscle weakness, morning stiffness, muscle tenderness and myalgias.  Skin:  Negative for color change, rash, hair loss and sensitivity to sunlight.  Allergic/Immunologic: Negative for susceptible to  infections.  Neurological:  Negative for dizziness and headaches.  Hematological:  Negative for swollen glands.  Psychiatric/Behavioral:  Negative for depressed mood and sleep disturbance. The patient is not nervous/anxious.     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/27/2020   Bilateral pleural effusion 11/25/2020   AKI (acute kidney injury) (HCC) 11/24/2020   Pericardial effusion 11/24/2020   Anemia 11/24/2020   Dyspnea 11/24/2020   Essential hypertension 11/24/2020   Acute on chronic diastolic CHF (congestive heart failure) (HCC) 11/23/2020    Past Medical History:  Diagnosis Date   Anemia    DVT (deep venous thrombosis) (HCC)    Hypertension     Family History  Problem Relation Age of Onset   Breast cancer Mother    Colon cancer Mother    Diabetes Father    Healthy Son    Healthy Son  Past Surgical History:  Procedure Laterality Date   IR THORACENTESIS ASP PLEURAL SPACE W/IMG GUIDE  11/25/2020   IR THORACENTESIS ASP PLEURAL SPACE W/IMG GUIDE  11/26/2020   Social History   Social History Narrative   Not on file   Immunization History  Administered Date(s) Administered   Moderna Sars-Covid-2 Vaccination 07/30/2019, 12/10/2019, 03/25/2020     Objective: Vital Signs: BP 94/63 (BP Location: Left Arm, Patient Position: Sitting, Cuff Size: Normal)   Pulse (!) 55   Resp 14   Ht 5\' 10"  (1.778 m)   Wt 174 lb (78.9 kg)   BMI 24.97 kg/m    Physical Exam Eyes:     Conjunctiva/sclera: Conjunctivae normal.  Cardiovascular:     Rate and Rhythm: Normal rate and regular rhythm.  Pulmonary:     Effort: Pulmonary effort is normal.     Breath sounds: Normal breath sounds.  Musculoskeletal:     Right lower leg: No edema.      Left lower leg: No edema.  Lymphadenopathy:     Cervical: No cervical adenopathy.  Skin:    General: Skin is warm and dry.     Findings: Bruising present. No rash.  Neurological:     Mental Status: He is alert.  Psychiatric:        Mood and Affect: Mood normal.      Musculoskeletal Exam:  Shoulders full ROM no tenderness or swelling Elbows full ROM no tenderness or swelling Wrists full ROM no tenderness or swelling Fingers full ROM no tenderness or swelling Knees full ROM no tenderness or swelling   Investigation: No additional findings.  Imaging: No results found.  Recent Labs: Lab Results  Component Value Date   WBC 9.9 06/30/2022   HGB 14.8 06/30/2022   PLT 332 06/30/2022   NA 138 06/30/2022   K 5.5 (H) 06/30/2022   CL 99 06/30/2022   CO2 28 06/30/2022   GLUCOSE 82 06/30/2022   BUN 24 06/30/2022   CREATININE 1.05 06/30/2022   BILITOT 0.3 06/30/2022   ALKPHOS 94 06/30/2022   AST 17 06/30/2022   ALT 24 06/30/2022   PROT 6.5 06/30/2022   ALBUMIN 4.6 06/30/2022   CALCIUM 9.5 06/30/2022   QFTBGOLDPLUS Negative 03/28/2022    Speciality Comments: PLQ Eye Exam 05/27/2021 normal [facility], [place], normal f/u 12 months  Procedures:  No procedures performed Allergies: Patient has no known allergies.   Assessment / Plan:     Visit Diagnoses: SLE (systemic lupus erythematosus related syndrome) (HCC) Lupus nephritis (HCC)  Extrarenal disease appears to be very well-controlled at this time.  However I do not recommend any de-escalation and DMARD until he has follow-up with nephrology consult plan is for at least several months of continued renal response.  Definitely seeing significant benefit since addition of Benlysta last year with decrease in proteinuria and improvement in serologic markers.  Continuing on hydroxychloroquine 200 mg, Benlysta 200 mg subcu weekly, mycophenolate 1500 mg twice daily, and prednisone 5 mg daily.  High risk medication use  Most recent  labs from March with no problem for continuing current regimen.  No significant interval infections reported.  Will not repeat lab workup today with most recent only 2 months ago and plan follow-up with Dr. Marisue Humble.  Orders: No orders of the defined types were placed in this encounter.  No orders of the defined types were placed in this encounter.    Follow-Up Instructions: Return in about 6 months (around 02/22/2023) for SLE on HCQ/MMF/BLY  f/u 6mos.   Fuller Plan, MD  Note - This record has been created using [employer].  Chart creation errors have been sought, but may not always  have been located. Such creation errors do not reflect on  the standard of medical care.

## 2022-08-30 NOTE — Unmapped (Signed)
[facility] Refill Coordination Note    Specialty Medication(s) to be Shipped:   Inflammatory Disorders: Benlysta and mycophenolate    Other medication(s) to be shipped: No additional medications requested for fill at this time     Miguel Sharp, DOB: March 01, 1961  Phone: [phone] (home) [phone] (work)      All above HIPAA information was verified with patient.     Was a Nurse, learning disability used for this call? No    Completed refill call assessment today to schedule patient's medication shipment from the [facility] [phone]).  All relevant notes have been reviewed.     Specialty medication(s) and dose(s) confirmed: Regimen is correct and unchanged.   Changes to medications: Binyamin reports no changes at this time.  Changes to insurance: No  New side effects reported not previously addressed with a pharmacist or physician: None reported  Questions for the pharmacist: No    Confirmed patient received a Conservation officer, historic buildings and a Surveyor, mining with first shipment. The patient will receive a drug information handout for each medication shipped and additional FDA Medication Guides as required.       DISEASE/MEDICATION-SPECIFIC INFORMATION        For patients on injectable medications: Patient currently has 2 doses left.  Next injection is scheduled for 05/24.06/01    SPECIALTY MEDICATION ADHERENCE     Medication Adherence    Patient reported X missed doses in the last month: 0  Specialty Medication: BENLYSTA 200 mg/mL Atin (belimumab)  Patient is on additional specialty medications: Yes  Additional Specialty Medications: mycophenolate 500 mg tablet (CELLCEPT)  Patient Reported Additional Medication X Missed Doses in the Last Month: 0  Patient is on more than two specialty medications: No  Any gaps in refill history greater than 2 weeks in the last 3 months: no  Demonstrates understanding of importance of adherence: yes  Informant: patient  Reliability of informant: reliable  Provider-estimated medication adherence level: good  Patient is at risk for Non-Adherence: No  Reasons for non-adherence: no problems identified  Confirmed plan for next specialty medication refill: delivery by pharmacy  Refills needed for supportive medications: not needed          Refill Coordination    Has the Patients' Contact Information Changed: No  Is the Shipping Address Different: No         Were doses missed due to medication being on hold? No    benlysta 200 mg/ml: 14 days of medicine on hand   Mycophenolate  500 mg: 10 days of medicine on hand       REFERRAL TO PHARMACIST     Referral to the pharmacist: Not needed      [facility]     Shipping address confirmed in Epic.       Delivery Scheduled: Yes, Expected medication delivery date: 05/30.     Medication will be delivered via UPS to the prescription address in Epic WAM.    Antonietta Barcelona   [facility] Specialty Technician

## 2022-09-06 MED FILL — BENLYSTA 200 MG/ML SUBCUTANEOUS AUTO-INJECTOR: SUBCUTANEOUS | 28 days supply | Qty: 4 | Fill #2

## 2022-09-06 MED FILL — MYCOPHENOLATE MOFETIL 500 MG TABLET: ORAL | 30 days supply | Qty: 180 | Fill #7

## 2022-09-27 ENCOUNTER — Other Ambulatory Visit: Payer: Self-pay | Admitting: Internal Medicine

## 2022-09-27 DIAGNOSIS — M329 Systemic lupus erythematosus, unspecified: Secondary | ICD-10-CM

## 2022-09-27 DIAGNOSIS — M3214 Glomerular disease in systemic lupus erythematosus: Secondary | ICD-10-CM

## 2022-09-27 DIAGNOSIS — Z79899 Other long term (current) drug therapy: Secondary | ICD-10-CM

## 2022-09-27 MED ORDER — BELIMUMAB 200 MG/ML SUBCUTANEOUS AUTO-INJECTOR
SUBCUTANEOUS | 2 refills | 28 days
Start: 2022-09-27 — End: ?

## 2022-09-27 MED ORDER — BENLYSTA 200 MG/ML SUBCUTANEOUS AUTO-INJECTOR
SUBCUTANEOUS | 5 refills | 28 days
Start: 2022-09-27 — End: ?

## 2022-09-27 NOTE — Unmapped (Signed)
[facility] Clinical Assessment & Refill Coordination Note    Miguel Sharp, DOB: 11-09-1960  Phone: [phone] (home) [phone] (work)    All above HIPAA information was verified with patient.     Was a Nurse, learning disability used for this call? No    Specialty Medication(s):   Inflammatory Disorders: Benlysta and mycophenolate     Current Outpatient Medications   Medication Sig Dispense Refill    amLODIPine (NORVASC) 10 MG tablet Take 10 mg by mouth daily.      belimumab (BENLYSTA) 200 mg/mL AtIn Inject the contents of 2 pens (400 mg) under the skin once a week for 3 weeks. THEN inject 1 pen ( 200mg ) under the skin every week thereafter. 8 mL 0    belimumab (BENLYSTA) 200 mg/mL AtIn Inject the contents of 1 auto-injector (200 mg total) under the skin every seven (7) days. 4 mL 2    ELIQUIS 5 mg Tab Take 5 mg by mouth Two (2) times a day.      empty container Misc USE AS DIRECTED 1 each 2    furosemide (LASIX) 80 MG tablet Take 80 mg by mouth Two (2) times a day.      hydrOXYchloroQUINE (PLAQUENIL) 200 mg tablet Take 200 mg by mouth daily.      mycophenolate (CELLCEPT) 500 mg tablet Take 3 tablets (1,500 mg total) by mouth two (2) times a day. 540 tablet 3     No current facility-administered medications for this visit.        Changes to medications: Miguel Sharp reports no changes at this time.    No Known Allergies    Changes to allergies: No    SPECIALTY MEDICATION ADHERENCE     Benlysta 200 mg/ml: 14 days of medicine on hand   mycophenolate 500 mg: 14 days of medicine on hand            Specialty medication(s) dose(s) confirmed: Regimen is correct and unchanged.     Are there any concerns with adherence? No    Adherence counseling provided? Not needed    CLINICAL MANAGEMENT AND INTERVENTION      Clinical Benefit Assessment:    Do you feel the medicine is effective or helping your condition? Yes    Clinical Benefit counseling provided? Not needed    Adverse Effects Assessment:    Are you experiencing any side effects? No    Are you experiencing difficulty administering your medicine? No    Quality of Life Assessment:    Quality of Life    Rheumatology  Oncology  Dermatology  Cystic Fibrosis          How many days over the past month did your SLE  keep you from your normal activities? For example, brushing your teeth or getting up in the morning. 0    Have you discussed this with your provider? Not needed    Acute Infection Status:    Acute infections noted within Epic:  No active infections  Patient reported infection: None    Therapy Appropriateness:    Is therapy appropriate and patient progressing towards therapeutic goals? Yes, therapy is appropriate and should be continued    DISEASE/MEDICATION-SPECIFIC INFORMATION      For patients on injectable medications: Patient currently has 1 doses left.  Next injection is scheduled for 09/29/22.    Chronic Inflammatory Diseases: Have you experienced any flares in the last month? No    PATIENT SPECIFIC NEEDS  Does the patient have any physical, cognitive, or cultural barriers? No    Is the patient high risk? No    Did the patient require a clinical intervention? No    Does the patient require physician intervention or other additional services (i.e., nutrition, smoking cessation, social work)? No    SOCIAL DETERMINANTS OF HEALTH     At the [facility], we have learned that life circumstances - like trouble affording food, housing, utilities, or transportation can affect the health of many of our patients.   That is why we wanted to ask: are you currently experiencing any life circumstances that are negatively impacting your health and/or quality of life? Patient declined to answer    Social Determinants of Health     Financial Resource Strain: Not on file   Internet Connectivity: Not on file   Food Insecurity: Not on file   Tobacco Use: Low Risk  (03/15/2021)    Patient History     Smoking Tobacco Use: Never     Smokeless Tobacco Use: Never     Passive Exposure: Not on file   Housing/Utilities: Not on file   Alcohol Use: Not on file   Transportation Needs: Not on file   Substance Use: Not on file   Health Literacy: Not on file   Physical Activity: Not on file   Interpersonal Safety: Not on file   Stress: Not on file   Intimate Partner Violence: Not on file   Depression: Not on file   Social Connections: Not on file       Would you be willing to receive help with any of the needs that you have identified today? Not applicable       SHIPPING     Specialty Medication(s) to be Shipped:   Inflammatory Disorders: Benlysta and mycophenolate    Other medication(s) to be shipped: No additional medications requested for fill at this time     Changes to insurance: No    Delivery Scheduled: Yes, Expected medication delivery date: 10/03/22.  However, Rx request for refills was sent to the provider as there are none remaining.     Medication will be delivered via UPS to the confirmed prescription address in [facility].    The patient will receive a drug information handout for each medication shipped and additional FDA Medication Guides as required.  Verified that patient has previously received a Conservation officer, historic buildings and a Surveyor, mining.    The patient or caregiver noted above participated in the development of this care plan and knows that they can request review of or adjustments to the care plan at any time.      All of the patient's questions and concerns have been addressed.    Miguel Sharp, PharmD   [facility] Specialty Pharmacist

## 2022-09-27 NOTE — Telephone Encounter (Signed)
Last Fill: 06/30/2022  Labs: 06/30/2022 The lab results look okay.  Potassium is mildly elevated at 5.5 this is an electrolyte can be associated with muscle cramping or in severe cases heart arrhythmia but this is a pretty mild variation outside normal.  Make sure he is not taking any food additives or supplements with potassium. Neutrophils are 1 type of white blood cell his number is just mildly elevated and total white blood cell count is normal.  I do not see any change needed with current lupus medication.  TB Gold: 03/28/2022    Next Visit: 02/26/2023  Last Visit: 08/22/2022  DX:SLE (systemic lupus erythematosus related syndrome)   Current Dose per office note 08/22/2022: Benlysta 200 mg subcu weekly   Okay to refill Benlysta?

## 2022-10-02 MED FILL — BENLYSTA 200 MG/ML SUBCUTANEOUS AUTO-INJECTOR: SUBCUTANEOUS | 28 days supply | Qty: 4 | Fill #0

## 2022-10-02 MED FILL — MYCOPHENOLATE MOFETIL 500 MG TABLET: ORAL | 30 days supply | Qty: 180 | Fill #8

## 2022-10-19 NOTE — Unmapped (Signed)
[facility] Refill Coordination Note      Specialty Medication(s) to be Shipped:   Inflammatory Disorders: Benlysta  Denied refill of mycophenolate says he has enough medication at this time    Other medication(s) to be shipped: No additional medications requested for fill at this time     Miguel Sharp, DOB: 11-02-1960  Phone: [phone] (home) [phone] (work)      All above HIPAA information was verified with patient.     Was a Nurse, learning disability used for this call? No    Completed refill call assessment today to schedule patient's medication shipment from the [facility] [phone]).  All relevant notes have been reviewed.     Specialty medication(s) and dose(s) confirmed: Regimen is correct and unchanged.   Changes to medications: Morrell reports no changes at this time.  Changes to insurance: No  New side effects reported not previously addressed with a pharmacist or physician: None reported  Questions for the pharmacist: No    Confirmed patient received a Conservation officer, historic buildings and a Surveyor, mining with first shipment. The patient will receive a drug information handout for each medication shipped and additional FDA Medication Guides as required.       DISEASE/MEDICATION-SPECIFIC INFORMATION        For patients on injectable medications: Patient currently has 1 doses left.  Next injection is scheduled for this weekend.    SPECIALTY MEDICATION ADHERENCE              Were doses missed due to medication being on hold? No    Benlysta 200 mg/ml: 9 days of medicine on hand       REFERRAL TO PHARMACIST     Referral to the pharmacist: Not needed      [facility]     Shipping address confirmed in Epic.       Delivery Scheduled: Yes, Expected medication delivery date: 10/25/22.     Medication will be delivered via UPS to the prescription address in Epic WAM.    Sherral Hammers, PharmD   [facility] Specialty Pharmacist

## 2022-10-24 MED FILL — BENLYSTA 200 MG/ML SUBCUTANEOUS AUTO-INJECTOR: SUBCUTANEOUS | 28 days supply | Qty: 4 | Fill #1

## 2022-11-08 ENCOUNTER — Encounter: Payer: Self-pay | Admitting: Internal Medicine

## 2022-11-13 NOTE — Unmapped (Signed)
[facility] Refill Coordination Note    Specialty Medication(s) to be Shipped:   Inflammatory Disorders: Benlysta and mycophenolate    Other medication(s) to be shipped: No additional medications requested for fill at this time     Miguel Sharp, DOB: 12-10-1960  Phone: [phone] (home) [phone] (work)      All above HIPAA information was verified with patient.     Was a Nurse, learning disability used for this call? No    Completed refill call assessment today to schedule patient's medication shipment from the [facility] [phone]).  All relevant notes have been reviewed.     Specialty medication(s) and dose(s) confirmed: Regimen is correct and unchanged.   Changes to medications: Miguel Sharp reports no changes at this time.  Changes to insurance: No  New side effects reported not previously addressed with a pharmacist or physician: None reported  Questions for the pharmacist: No    Confirmed patient received a Conservation officer, historic buildings and a Surveyor, mining with first shipment. The patient will receive a drug information handout for each medication shipped and additional FDA Medication Guides as required.       DISEASE/MEDICATION-SPECIFIC INFORMATION        N/A    SPECIALTY MEDICATION ADHERENCE     Medication Adherence    Patient reported X missed doses in the last month: 0  Specialty Medication: BENLYSTA 200 mg/mL Atin (belimumab)  Patient is on additional specialty medications: Yes  Additional Specialty Medications: mycophenolate 500 mg tablet (CELLCEPT)  Patient Reported Additional Medication X Missed Doses in the Last Month: 0  Patient is on more than two specialty medications: No  Any gaps in refill history greater than 2 weeks in the last 3 months: no  Demonstrates understanding of importance of adherence: yes  Informant: patient  Reliability of informant: reliable  Provider-estimated medication adherence level: good  Patient is at risk for Non-Adherence: No  Reasons for non-adherence: no problems identified              Were doses missed due to medication being on hold? No    mycophenolate 500 mg tablet (CELLCEPT)  : 10 days of medicine on hand   BENLYSTA 200 mg/mL Atin (belimumab)  : 14 days of medicine on hand       REFERRAL TO PHARMACIST     Referral to the pharmacist: Not needed      [facility]     Shipping address confirmed in Epic.       Delivery Scheduled: Yes, Expected medication delivery date: 11/22/22.     Medication will be delivered via UPS to the prescription address in Epic WAM.    Miguel Sharp' W Danae Chen Shared [facility] Specialty Technician

## 2022-11-14 ENCOUNTER — Other Ambulatory Visit: Payer: Self-pay | Admitting: *Deleted

## 2022-11-14 DIAGNOSIS — Z79899 Other long term (current) drug therapy: Secondary | ICD-10-CM

## 2022-11-14 MED ORDER — HYDROXYCHLOROQUINE SULFATE 200 MG PO TABS: 200.0000 mg | ORAL_TABLET | Freq: Every day | ORAL | 0 refills | Status: DC

## 2022-11-14 NOTE — Telephone Encounter (Signed)
Patient contacted the office to request a medication refill.   1. Name of Medication: Plaquenil  2. How are you currently taking this medication (dosage and times per day)? hydroxychloroquine 200 mg    3. What pharmacy would you like for that to be sent to? [facility] [address]

## 2022-11-14 NOTE — Telephone Encounter (Signed)
Last Fill: 12/01/2020  Eye exam: 10/18/2022 normal    Labs: 06/30/2022 lab results look okay.  Potassium is mildly elevated at 5.5 this is an electrolyte can be associated with muscle cramping or in severe cases heart arrhythmia but this is a pretty mild variation outside normal.  Make sure he is not taking any food additives or supplements with potassium. Neutrophils are 1 type of white blood cell his number is just mildly elevated and total white blood cell count is normal.  I do not see any change needed with current lupus medication.  Next Visit: 02/26/2023  Last Visit: 08/22/2022  DX: SLE (systemic lupus erythematosus related syndrome)   Current Dose per office note 08/22/2022: hydroxychloroquine 200 mg   Patient aware he is due to update labs and will go this week to update. Lab orders released.   Okay to refill Plaquenil?

## 2022-11-21 MED FILL — MYCOPHENOLATE MOFETIL 500 MG TABLET: ORAL | 30 days supply | Qty: 180 | Fill #9

## 2022-11-21 MED FILL — BENLYSTA 200 MG/ML SUBCUTANEOUS AUTO-INJECTOR: SUBCUTANEOUS | 28 days supply | Qty: 4 | Fill #2

## 2022-12-09 IMAGING — DX DG CHEST 1V
1 series · 1 of 1 positions shown · non-contrast
Comparison: Multiple chest radiographs, most recently 11/25/2020.
IR ultrasound, 11/25/2020.

CLINICAL DATA: Thoracentesis

EXAM:
CHEST  1 VIEW

[chest ap]
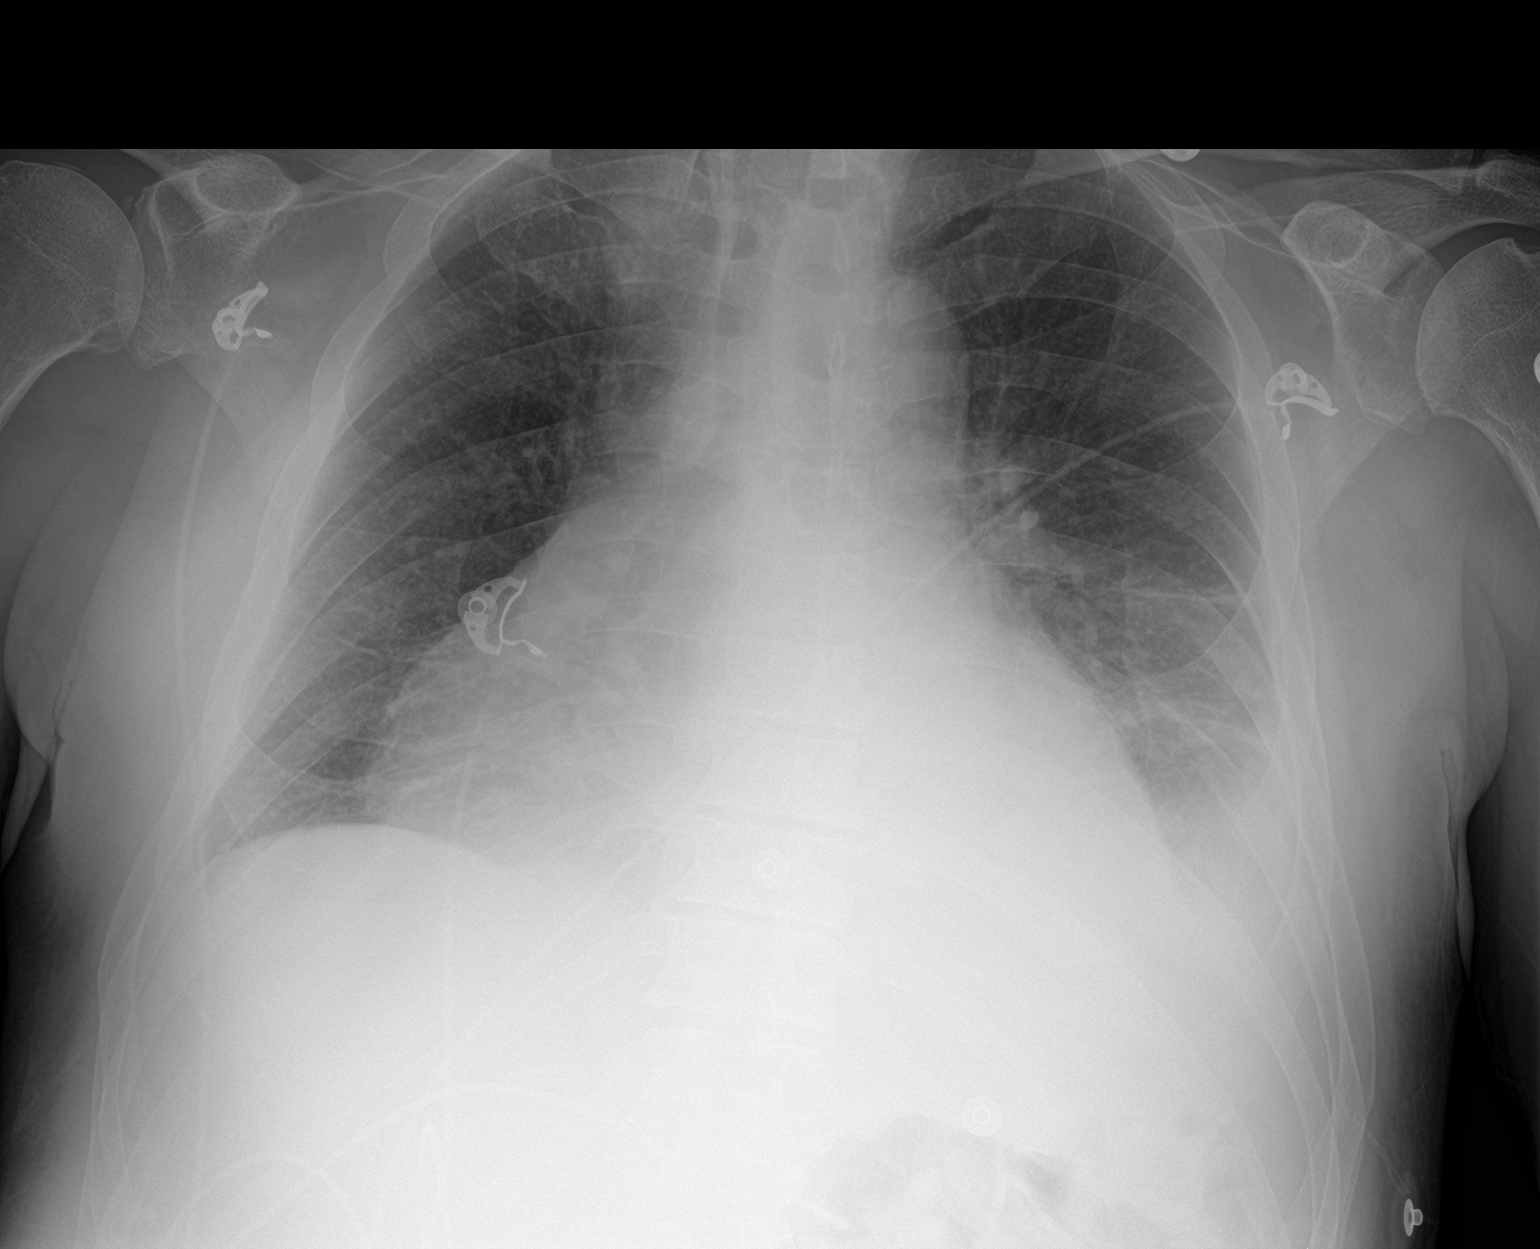

[1 of 1 positions shown; findings below may reference images not displayed]

FINDINGS: Marked cardiomegaly. Hypoinflation. Resolution of
previously-demonstrated small volume RIGHT pleural effusion. Small
volume LEFT pleural effusion, now appears to be layering. LEFT
basilar opacity silhouetting the cardiac apex and LEFT hemidiaphragm
no enlarging pneumothorax. No interval osseous abnormality.
IMPRESSION: Resolution of previously-demonstrated small volume RIGHT pleural
effusion. No pneumothorax.

Residual small volume LEFT pleural effusion.

## 2022-12-10 IMAGING — US IR THORACENTESIS ASP PLEURAL SPACE W/IMG GUIDE
1 series · 1 of 1 positions shown · non-contrast
Comparison: none

INDICATION: Patient with history of pericardial effusion found to have bilateral
pleural effusions. Patient presents for therapeutic left-sided
thoracentesis

[Series 1: ir (id) (id)/(id)/(id) ir · 1 of 1 slices shown]
[im 1/1]
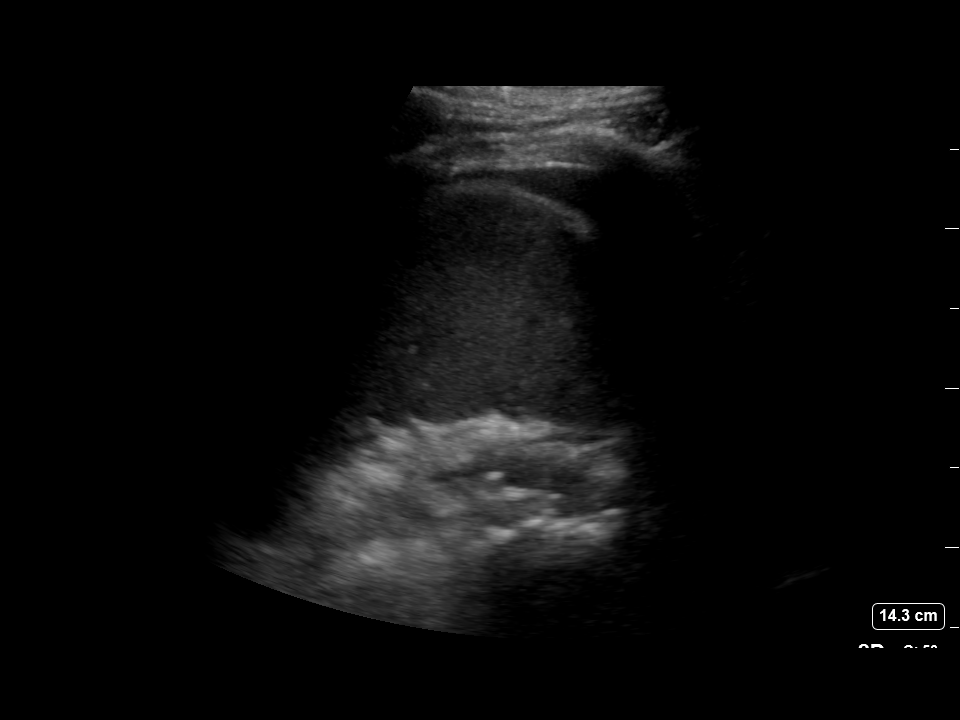

[1 of 1 positions shown; findings below may reference images not displayed]

EXAM:
ULTRASOUND GUIDED THERAPEUTIC LEFT-SIDED THORACENTESIS

MEDICATIONS:
Lidocaine 1% 10 mL

COMPLICATIONS:
None immediate.

PROCEDURE:
An ultrasound guided thoracentesis was thoroughly discussed with the
patient and questions answered. The benefits, risks, alternatives
and complications were also discussed. The patient understands and
wishes to proceed with the procedure. Written consent was obtained.

Ultrasound was performed to localize and mark an adequate pocket of
fluid in the left chest. The area was then prepped and draped in the
normal sterile fashion. 1% Lidocaine was used for local anesthesia.
Under ultrasound guidance a 6 Fr Safe-T-Centesis catheter was
introduced. Thoracentesis was performed. The catheter was removed
and a dressing applied.
FINDINGS: A total of approximately 900 mL of straw-colored fluid was removed.
IMPRESSION: Successful ultrasound guided left-sided therapeutic thoracentesis
yielding 900 mL of pleural fluid.

Read by: Fukuoka Imano, NP

## 2022-12-10 IMAGING — DX DG CHEST 1V
1 series · 1 of 1 positions shown · non-contrast
Comparison: Chest x-ray from yesterday.

CLINICAL DATA: Status post left thoracentesis.

EXAM:
CHEST  1 VIEW

[chest ap]
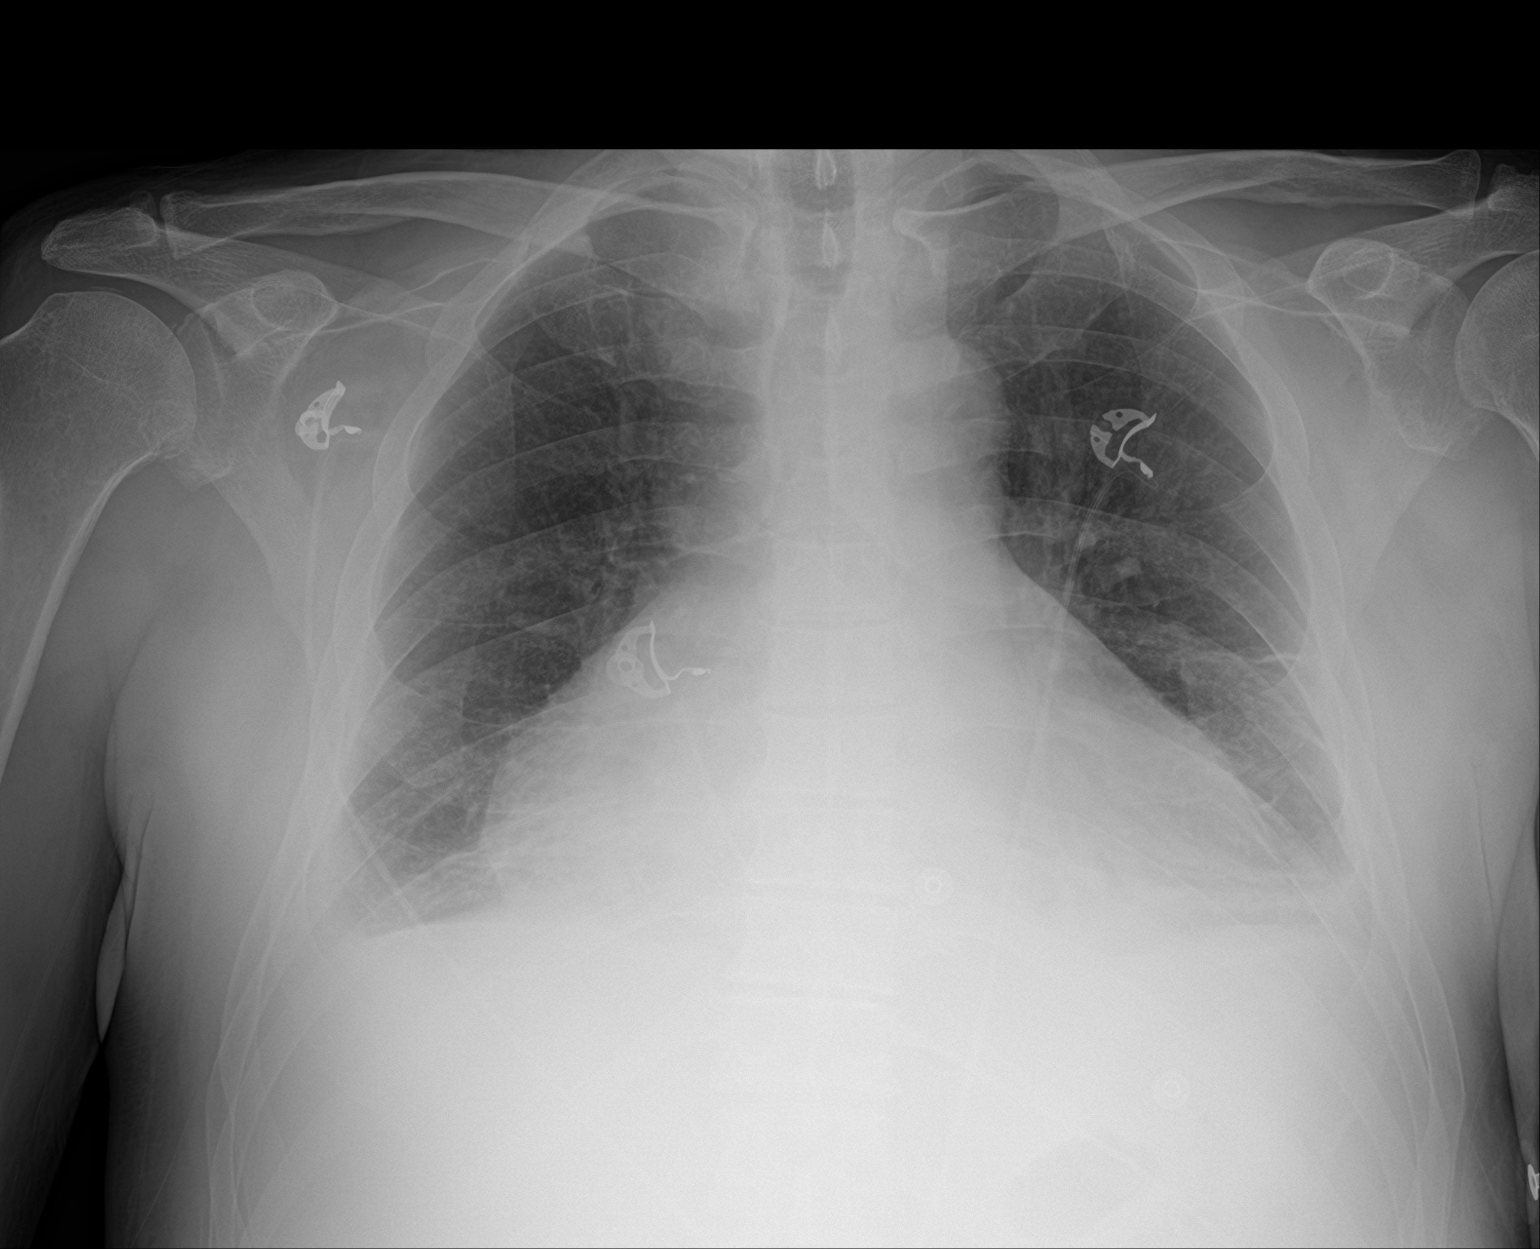

[1 of 1 positions shown; findings below may reference images not displayed]

FINDINGS: Stable cardiomegaly. Residual trace bilateral pleural effusions with
mild bibasilar atelectasis. No pneumothorax. No acute osseous
abnormality.
IMPRESSION: 1. Residual trace bilateral pleural effusions and bibasilar
atelectasis. No pneumothorax.

## 2022-12-13 IMAGING — US US BIOPSY
1 series · 13 of 15 positions shown · non-contrast
Comparison: None.

INDICATION: 59-year-old male with acute kidney injury.

EXAM:
ULTRASOUND GUIDED RENAL BIOPSY

[Series 1001: us biopsy (kidney) · 13 of 15 slices shown]
[im 1/15]
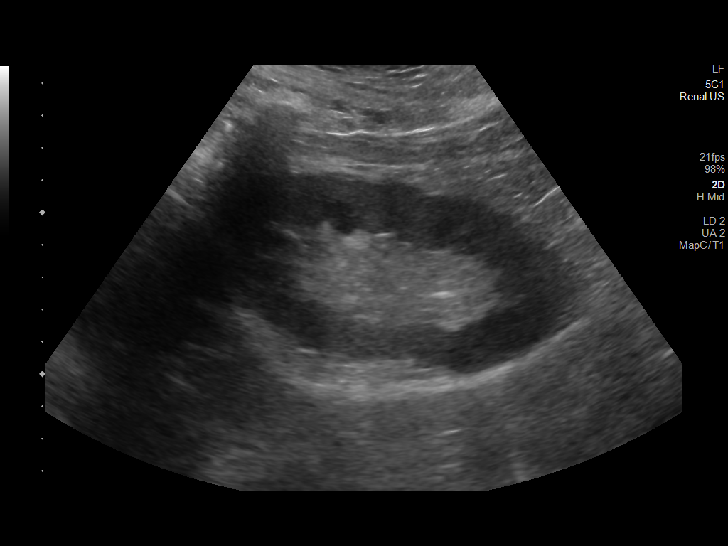
[im 2/15]
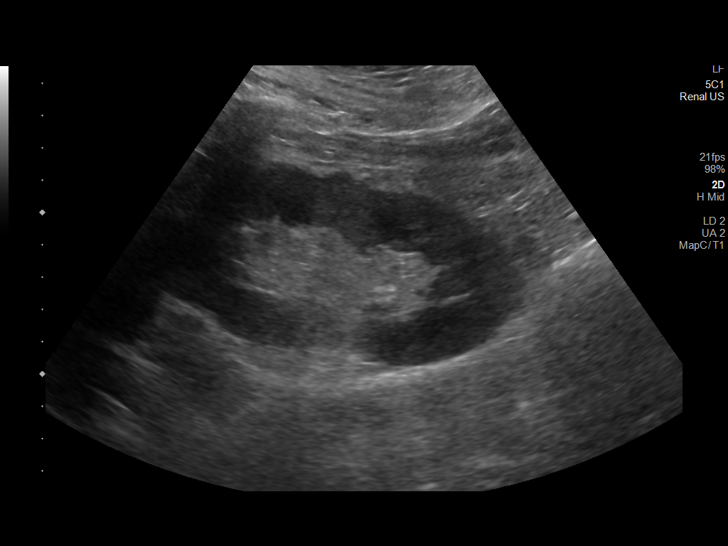
[im 3/15]
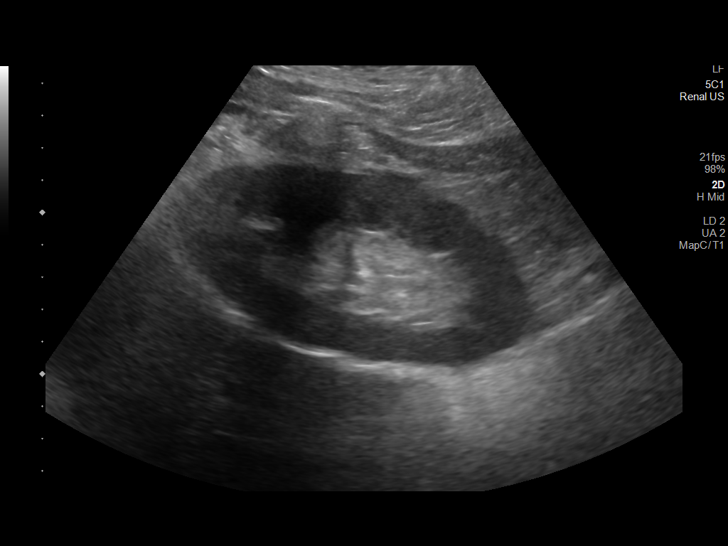
[im 5/15]
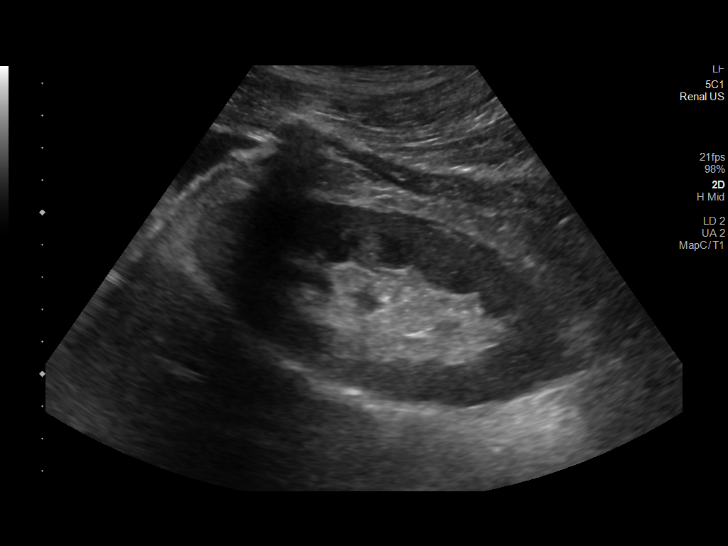
[im 6/15]
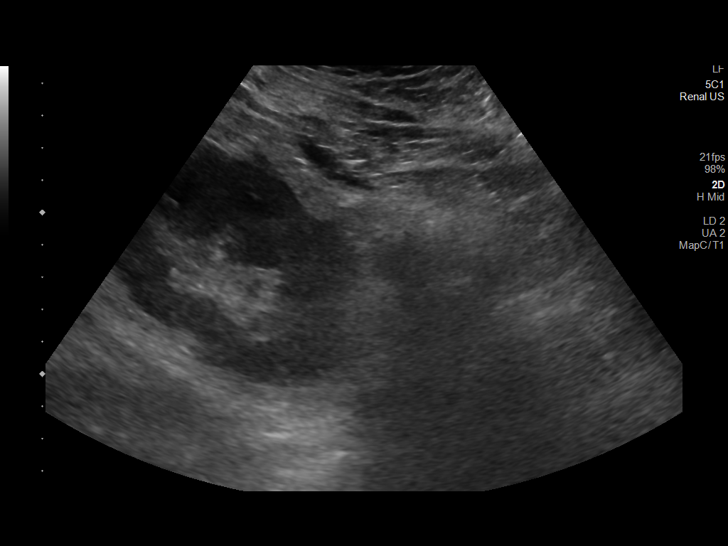
[im 7/15]
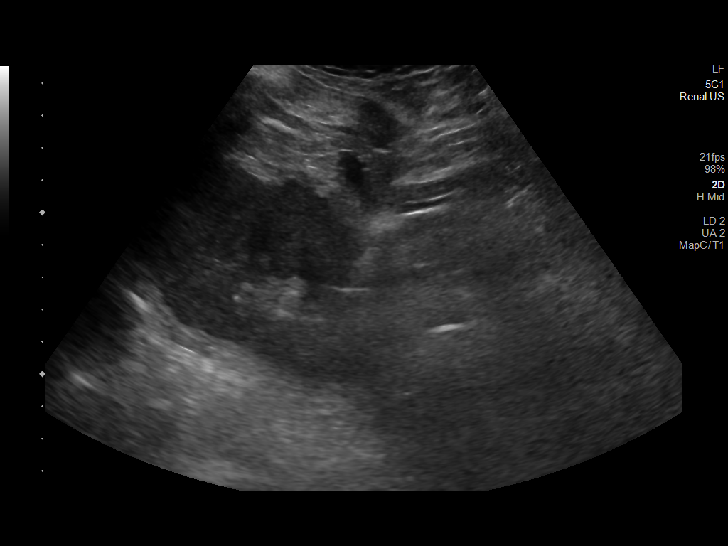
[im 8/15]
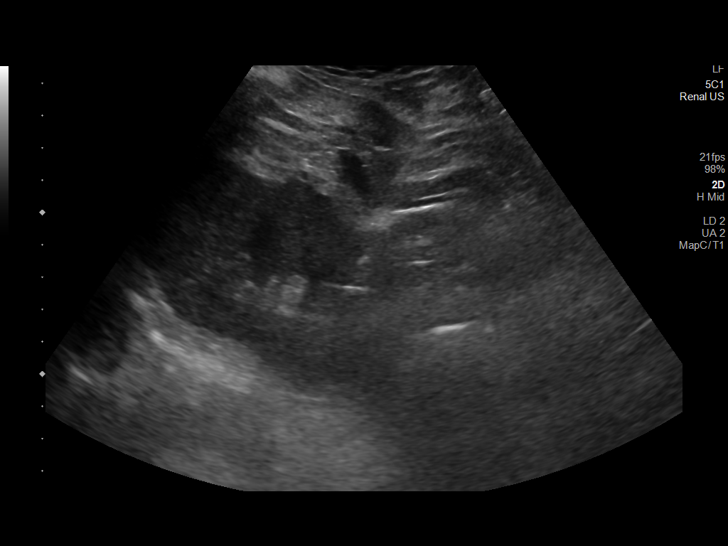
[im 9/15]
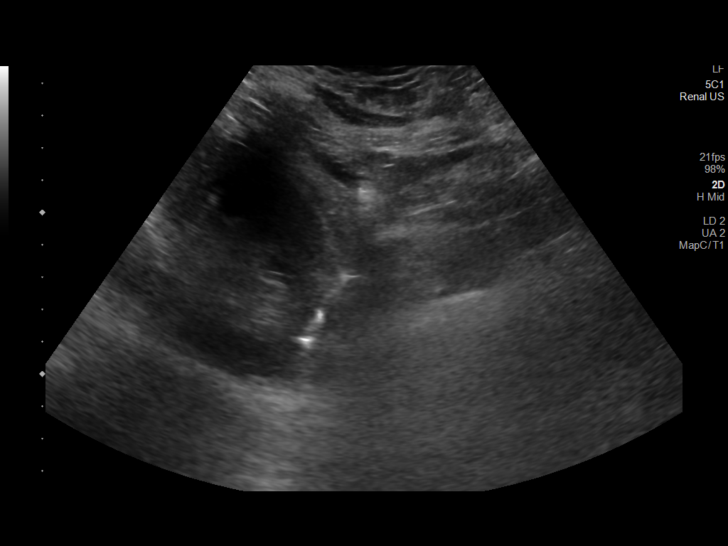
[im 10/15]
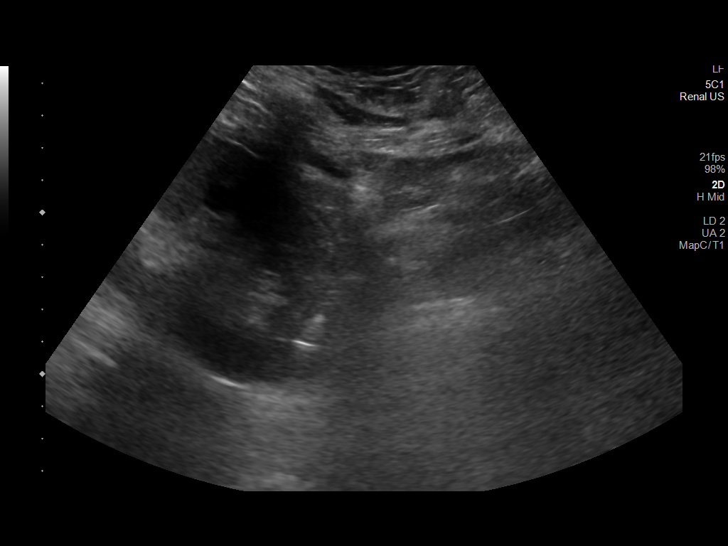
[im 11/15]
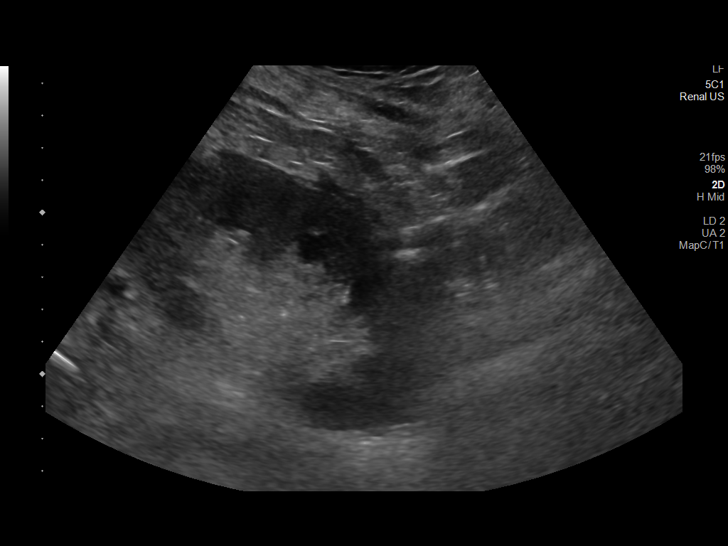
[im 13/15]
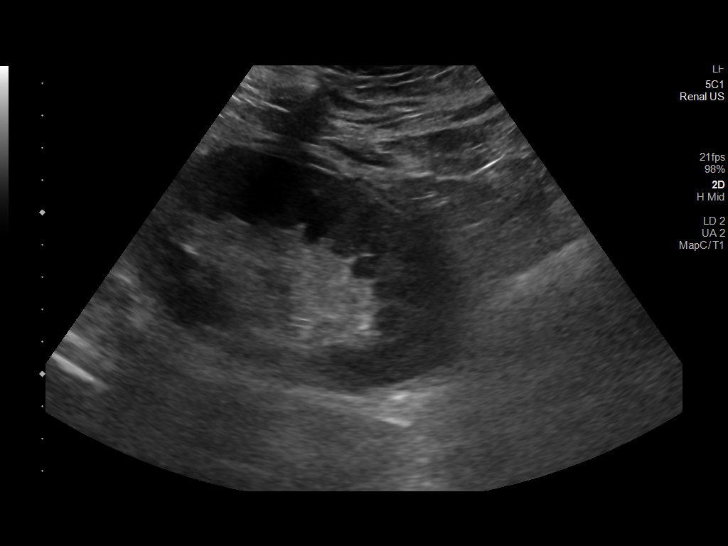
[im 14/15]
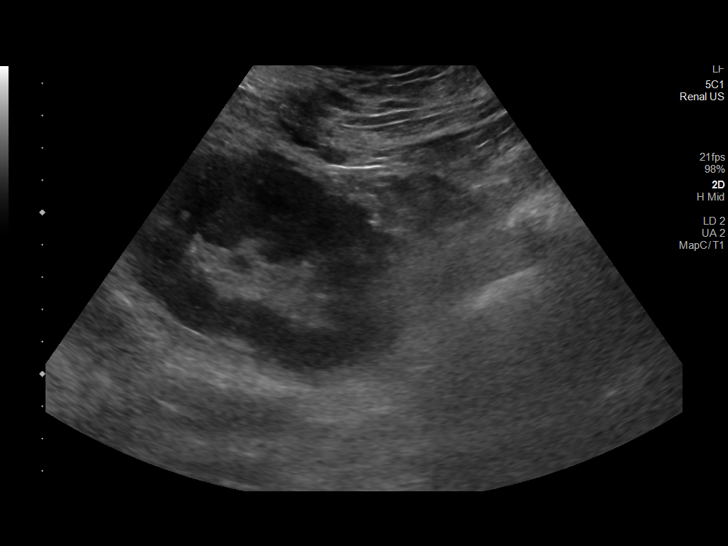
[im 15/15]
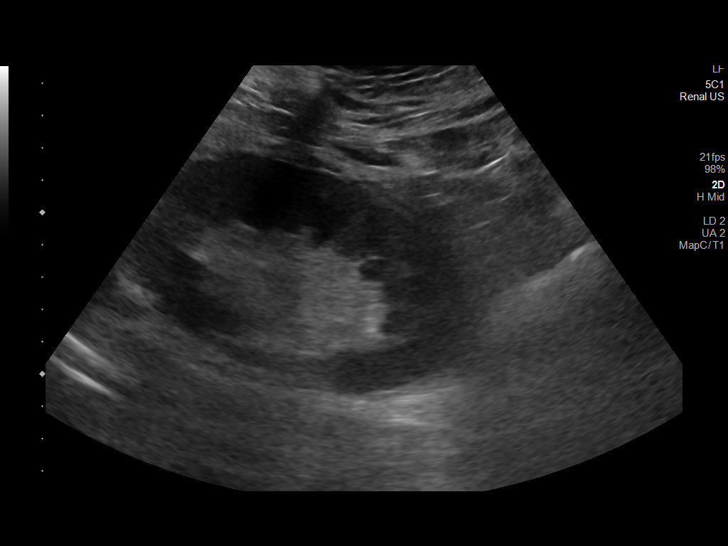

[13 of 15 positions shown; findings below may reference images not displayed]

MEDICATIONS:
Fentanyl 100 mcg IV; Versed 2.5 mg IV; 10 mg hydralazine

ANESTHESIA/SEDATION:
Total Moderate Sedation time

18 minutes

The patient's vital signs and level of consciousness were monitored
continuously by radiology nursing under my direct supervision.

COMPLICATIONS:
None immediate

PROCEDURE:
Informed written consent was obtained from the patient after a
discussion of the risks, benefits and alternatives to treatment. The
patient understands and consents the procedure. A timeout was
performed prior to the initiation of the procedure.

Ultrasound scanning was performed of the bilateral flanks. The
inferior pole of the left kidney was selected for biopsy due to
location and sonographic window. The procedure was planned. The
operative site was prepped and draped in the usual sterile fashion.
The overlying soft tissues were anesthetized with 1% lidocaine with
epinephrine. A 17 gauge core needle biopsy device was advanced into
the inferior cortex of the left kidney and 3 core biopsies were
obtained under direct ultrasound guidance. Images were saved for
documentation purposes. The biopsy device was removed and hemostasis
was obtained with manual compression. Post procedural scanning was
negative for significant post procedural hemorrhage or additional
complication. A dressing was placed. The patient tolerated the
procedure well without immediate post procedural complication.
IMPRESSION: Technically successful ultrasound guided left renal biopsy.

## 2022-12-15 NOTE — Unmapped (Signed)
[facility] Refill Coordination Note    Specialty Medication(s) to be Shipped:   Inflammatory Disorders: Benlysta and mycophenolate    Other medication(s) to be shipped: No additional medications requested for fill at this time     Miguel Sharp, DOB: 06-07-1960  Phone: [phone] (home) [phone] (work)      All above HIPAA information was verified with patient.     Was a Nurse, learning disability used for this call? No    Completed refill call assessment today to schedule patient's medication shipment from the [facility] [phone]).  All relevant notes have been reviewed.     Specialty medication(s) and dose(s) confirmed: Regimen is correct and unchanged.   Changes to medications: Miguel Sharp reports no changes at this time.  Changes to insurance: No  New side effects reported not previously addressed with a pharmacist or physician: None reported  Questions for the pharmacist: No    Confirmed patient received a Conservation officer, historic buildings and a Surveyor, mining with first shipment. The patient will receive a drug information handout for each medication shipped and additional FDA Medication Guides as required.       DISEASE/MEDICATION-SPECIFIC INFORMATION        N/A    SPECIALTY MEDICATION ADHERENCE     Medication Adherence    Specialty Medication: BENLYSTA 200 mg/mL Atin (belimumab)  Patient is on additional specialty medications: Yes  Additional Specialty Medications: mycophenolate 500 mg tablet (CELLCEPT)              Were doses missed due to medication being on hold? No    mycophenolate 500 mg tablet (CELLCEPT)  : 10 days of medicine on hand   BENLYSTA 200 mg/mL Atin (belimumab)  : 14 days of medicine on hand       REFERRAL TO PHARMACIST     Referral to the pharmacist: Not needed      [facility]     Shipping address confirmed in Epic.       Delivery Scheduled: Yes, Expected medication delivery date: 12/20/22.     Medication will be delivered via UPS to the prescription address in Epic WAM.    Gaspar Cola Shared [facility] Specialty Technician

## 2022-12-19 MED FILL — BENLYSTA 200 MG/ML SUBCUTANEOUS AUTO-INJECTOR: SUBCUTANEOUS | 28 days supply | Qty: 4 | Fill #3

## 2022-12-19 MED FILL — MYCOPHENOLATE MOFETIL 500 MG TABLET: ORAL | 30 days supply | Qty: 180 | Fill #10

## 2023-01-04 NOTE — Unmapped (Signed)
[facility] Refill Coordination Note    Specialty Medication(s) to be Shipped:   Inflammatory Disorders: Benlysta and mycophenolate    Other medication(s) to be shipped: No additional medications requested for fill at this time     Miguel Sharp, DOB: 1960-12-14  Phone: [phone] (home) [phone] (work)      All above HIPAA information was verified with patient.     Was a Nurse, learning disability used for this call? No    Completed refill call assessment today to schedule patient's medication shipment from the [facility] [phone]).  All relevant notes have been reviewed.     Specialty medication(s) and dose(s) confirmed: Regimen is correct and unchanged.   Changes to medications: Miguel Sharp reports no changes at this time.  Changes to insurance: No  New side effects reported not previously addressed with a pharmacist or physician: None reported  Questions for the pharmacist: No    Confirmed patient received a Conservation officer, historic buildings and a Surveyor, mining with first shipment. The patient will receive a drug information handout for each medication shipped and additional FDA Medication Guides as required.       DISEASE/MEDICATION-SPECIFIC INFORMATION        N/A    SPECIALTY MEDICATION ADHERENCE     Medication Adherence    Patient reported X missed doses in the last month: 0  Specialty Medication: mycophenolate 500 mg tablet (CELLCEPT)  Patient is on additional specialty medications: Yes  Additional Specialty Medications: BENLYSTA 200 mg/mL Atin (belimumab)  Patient Reported Additional Medication X Missed Doses in the Last Month: 0              Were doses missed due to medication being on hold? No    mycophenolate 500 mg tablet (CELLCEPT)  : bottle and a half of medicine on hand   BENLYSTA 200 mg/mL Atin (belimumab)  : 14 days of medicine on hand       REFERRAL TO PHARMACIST     Referral to the pharmacist: Not needed      [facility]     Shipping address confirmed in Epic. Delivery Scheduled: Yes, Expected medication delivery date: 01/12/23.     Medication will be delivered via UPS to the prescription address in Epic WAM.    Miguel Sharp Shared [facility] Specialty Technician

## 2023-01-11 MED FILL — MYCOPHENOLATE MOFETIL 500 MG TABLET: ORAL | 30 days supply | Qty: 180 | Fill #11

## 2023-01-11 MED FILL — BENLYSTA 200 MG/ML SUBCUTANEOUS AUTO-INJECTOR: SUBCUTANEOUS | 28 days supply | Qty: 4 | Fill #4

## 2023-02-02 MED ORDER — MYCOPHENOLATE MOFETIL 500 MG TABLET
ORAL_TABLET | Freq: Two times a day (BID) | ORAL | 3 refills | 90 days
Start: 2023-02-02 — End: ?

## 2023-02-02 NOTE — Unmapped (Addendum)
[facility] Refill Coordination Note    Specialty Medication(s) to be Shipped:   Inflammatory Disorders: Benlysta and mycophenolate    Other medication(s) to be shipped: No additional medications requested for fill at this time     Miguel Sharp, DOB: [birth year]  Phone: [phone] (home) [phone] (work)      All above HIPAA information was verified with patient.     Was a Nurse, learning disability used for this call? No    Completed refill call assessment today to schedule patient's medication shipment from the [facility]  [phone]).  All relevant notes have been reviewed.     Specialty medication(s) and dose(s) confirmed: Regimen is correct and unchanged.   Changes to medications: [name] reports no changes at this time.  Changes to insurance: No  New side effects reported not previously addressed with a pharmacist or physician: None reported  Questions for the pharmacist: No    Confirmed patient received a Conservation officer, historic buildings and a Surveyor, mining with first shipment. The patient will receive a drug information handout for each medication shipped and additional FDA Medication Guides as required.       DISEASE/MEDICATION-SPECIFIC INFORMATION        For patients on injectable medications: Patient currently has 3 doses left.  Next injection is scheduled for 02/03/23.    SPECIALTY MEDICATION ADHERENCE     Medication Adherence    Specialty Medication: BENLYSTA 200 mg/mL Atin (belimumab)              Were doses missed due to medication being on hold? No    mycophenolate 500  mg: 14 days of medicine on hand   BENLYSTA 200  mg/ml: 3 doses of medicine on hand       REFERRAL TO PHARMACIST     Referral to the pharmacist: Not needed      SHIPPING     Shipping address confirmed in Epic.       Delivery Scheduled: Yes, Expected medication delivery date: 02/14/23.  However, Rx request for refills was sent to the provider as there are none remaining. Medication will be delivered via UPS to the prescription address in Epic WAM.    Kerby Less   [facility]  Specialty Technician

## 2023-02-07 ENCOUNTER — Other Ambulatory Visit: Payer: Self-pay | Admitting: Internal Medicine

## 2023-02-07 NOTE — Telephone Encounter (Signed)
Last Fill: 11/14/2022  Eye exam: PLQ Eye Exam 10/18/2022 normal   Labs: 11/14/2022 Glucose 59 BUN/Creatinine Ratio 24 Neutrophils Absolute 8.0  Next Visit: 02/26/2023  Last Visit: 08/22/2022  DX:SLE (systemic lupus erythematosus related syndrome)   Current Dose per office note 08/22/2022: hydroxychloroquine 200 mg   Okay to refill Plaquenil?

## 2023-02-13 MED FILL — MYCOPHENOLATE MOFETIL 500 MG TABLET: ORAL | 90 days supply | Qty: 540 | Fill #0

## 2023-02-13 MED FILL — BENLYSTA 200 MG/ML SUBCUTANEOUS AUTO-INJECTOR: SUBCUTANEOUS | 28 days supply | Qty: 4 | Fill #5

## 2023-02-13 NOTE — Progress Notes (Unsigned)
Office Visit Note  Patient: Gerald Lloyd             Date of Birth: December 30, 1960           MRN: [id number]             PCP: Garnette Gunner, MD Referring: Garnette Gunner, MD Visit Date: 02/26/2023   Subjective:  Follow-up   History of Present Illness: Gerald Lloyd is a 62 y.o. male here for follow up for SLE with class IV/V nephritis on hydroxychloroquine 200 mg, mycophenolate 1500 mg twice daily, Benlysta 200 mg subcu weekly, and prednisone 5 mg daily.  Most recent labs were collected in September Dr. Marisue Humble including urine protein creatinine ratio that was completely normal.  He has had some intermittent upper respiratory symptoms viral infection versus allergies but did not require antibiotics and is doing well today.  Previous HPI 08/22/2022 Gerald Lloyd is a 62 y.o. male here for follow up for SLE with class IV/V nephritis on hydroxychloroquine 200 mg, mycophenolate 1500 mg twice daily, Benlysta 200 mg subcu weekly, and prednisone 5 mg daily.  Since her last visit he has had no significant change in symptoms.  Still notices very easy bruising and spots appearing on his forearms and shins otherwise no new issues.  Discussed possible medication changes with Dr. Verna Czech with upcoming appointment in the next few months with his low blood pressure he does get some lightheadedness or dizziness if standing up too quickly.   Previous HPI 05/22/22 Gerald Lloyd is a 62 y.o. male here for follow up for SLE with class IV/V nephritis on HCQ 200 mg, MMF 1500 mg BID, benlysta 200 mg [place] weekly, and prednisone 5 mg daily. He has not experienced any increase in symptoms. No significant leg swelling no shortness of breath or large weight change. He has easy bruising on eliquis treatment. He saw Dr. Marisue Humble last week for follow up with labs from 2 weeks ago. No change in treatment recommended based on renal parameters anticipates aiming for 2 years of treatment response before  tapering.   Previous HPI 01/06/22 Gerald Lloyd is a 62 y.o. male here for follow up for systemic lupus with lupus nephritis currently on HCQ 200 mg daily MF 1500 mg BID benlysta 200 mg [place] weekly, and prednisone 5 mg daily. He is feeling well today without any new complaints. 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 He remains on eliquis 5 mg BID for anticoagulation. He anticipates next labs visit in about 2 weeks before his follow up with Dr. Marisue Humble. He reports seeing [facility] in [place] with exam for HCQ monitoring which was fine.   Previous HPI 09/23/2021 Gerald Lloyd is a 62 y.o. male here for follow up for systemic lupus with lupus nephritis on HCQ 400 mg daily, MMF 1500 mg BID, Benlysta 200 mg [place] weekly, prednisone 5 mg daily. He is doing well overall without major interval events. He started on low dose lisinopril for continued proteinuria.   Previous HPI 06/24/2021 Gerald Lloyd is a 62 y.o. male here for follow up for systemic lupus with lupus nephritis on HCQ 400 mg daily, cellcept 1500 mg BID, Benlysta 200 mg [place] weekly, prednisone 5 mg daily. He started benlysta with induction dose in January due partial renal response. He has not noticed any problems taking the Benlysta. Leg swelling is doing well.  He has not had any shortness of breath or chest pain.  He does report episodes of burping during the past month these are not every day but are very noticeable when they occur.  He is not having any other problems such as nausea diarrhea abdominal pain or acid reflux.  He had follow-up with Dr. Marisue Humble in the past month reports findings indicated continue to trend in the right direction but still not a complete renal response.   Previous HPI 03/24/21 Gerald Lloyd is a 62 y.o. male here for follow up for systemic lupus with class IV/V nephritis now on HCQ 400 mg daily, cellcept 1500 mg BID, and prednisone 5 mg daily. Most  recent follow up with Dr. Marisue Humble with continued proteinuria partially improving.  He continues having mild bilateral leg swelling no other symptom complaints.   Previous HPI 12/22/20 Gerald Lloyd is a 62 y.o. male with history of lower extremity DVT on eliquis anticoagulation here for new diagnosis systemic lupus.  In February he was evaluated for unilateral left leg swelling with ultrasound exam finding a DVT in the left popliteal vein unprovoked.  There was ongoing local swelling of the left calf for several months.  He then presented to the hospital due to shortness of breath.  Work-up identified bilateral pleural effusions, large circumferential pericardial effusion with tamponade and volume overload, lupus nephritis. He was started with pulse dose steroids, cellcept, and hydroxychloroquine.  And headache very good improvement after starting the high-dose steroids.  He has followed up for this with Dr. Marisue Humble at [facility] on induction therapy for lupus nephritis.  He currently feels that most of his symptoms are much improved he does have a generalized weakness and decreased exertion tolerance ever since his hospital stay. He denies any particular skin rashes, alopecia, arthritis, oral ulcers, or Raynaud's symptoms.  He does not know of any family history of autoimmune disease.  He has never experienced similar symptoms prior to these events.   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  HENT:  Negative for mouth sores and mouth dryness.   Eyes:  Negative for dryness.  Respiratory:  Negative for shortness of breath.    Cardiovascular:  Negative for chest pain and palpitations.  Gastrointestinal:  Negative for blood in stool, constipation and diarrhea.  Endocrine: Negative for increased urination.  Genitourinary:  Negative for involuntary urination.  Musculoskeletal:  Positive for myalgias and myalgias. Negative for joint pain, gait problem, joint pain, joint swelling, muscle weakness, morning stiffness and muscle tenderness.  Skin:  Negative for color change, rash, hair loss and sensitivity to sunlight.  Allergic/Immunologic: Negative for susceptible to infections.  Neurological:  Negative for dizziness and headaches.  Hematological:  Negative for swollen glands.  Psychiatric/Behavioral:  Negative for depressed mood and sleep disturbance. The patient is not nervous/anxious.     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/27/2020   Bilateral pleural effusion 11/25/2020   AKI (acute kidney injury) (HCC) 11/24/2020   Pericardial effusion 11/24/2020  Anemia 11/24/2020   Dyspnea 11/24/2020   Essential hypertension 11/24/2020   Acute on chronic diastolic CHF (congestive heart failure) (HCC) 11/23/2020    Past Medical History:  Diagnosis Date   Anemia    DVT (deep venous thrombosis) (HCC)    Hypertension     Family History  Problem Relation Age of Onset   Breast cancer Mother    Colon cancer Mother    Diabetes Father    Healthy Son    Healthy Son    Past Surgical History:  Procedure Laterality Date   IR THORACENTESIS ASP PLEURAL SPACE W/IMG GUIDE  11/25/2020   IR THORACENTESIS ASP PLEURAL SPACE W/IMG GUIDE  11/26/2020   Social History   Social History Narrative   Not on file   Immunization History  Administered Date(s) Administered   Moderna  Sars-Covid-2 Vaccination 07/30/2019, 12/10/2019, 03/25/2020     Objective: Vital Signs: BP 101/67 (BP Location: Left Arm, Patient Position: Sitting, Cuff Size: Normal)   Pulse (!) 58   Resp 14   Ht 5\' 10"  (1.778 m)   Wt 177 lb (80.3 kg)   BMI 25.40 kg/m    Physical Exam Eyes:     Conjunctiva/sclera: Conjunctivae normal.  Cardiovascular:     Rate and Rhythm: Normal rate and regular rhythm.  Pulmonary:     Effort: Pulmonary effort is normal.     Breath sounds: Normal breath sounds.  Musculoskeletal:     Right lower leg: No edema.     Left lower leg: No edema.  Lymphadenopathy:     Cervical: No cervical adenopathy.  Skin:    General: Skin is warm and dry.     Findings: No rash.  Neurological:     Mental Status: He is alert.  Psychiatric:        Mood and Affect: Mood normal.      Musculoskeletal Exam:  Neck full ROM no tenderness Shoulders full ROM no tenderness or swelling Elbows full ROM no tenderness or swelling Wrists full ROM no tenderness or swelling Fingers full ROM no tenderness or swelling Knees full ROM no tenderness or swelling  Investigation: No additional findings.  Imaging: No results found.  Recent Labs: Lab Results  Component Value Date   WBC 9.2 02/26/2023   HGB 15.9 02/26/2023   PLT 336 02/26/2023   NA 135 02/26/2023   K 5.0 02/26/2023   CL 99 02/26/2023   CO2 28 02/26/2023   GLUCOSE 89 02/26/2023   BUN 21 02/26/2023   CREATININE 1.23 02/26/2023   BILITOT 0.5 02/26/2023   ALKPHOS 84 11/14/2022   AST 29 02/26/2023   ALT 41 02/26/2023   PROT 6.5 02/26/2023   ALBUMIN 4.4 11/14/2022   CALCIUM 9.4 02/26/2023   QFTBGOLDPLUS Negative 03/28/2022    Speciality Comments: PLQ Eye Exam 10/18/2022 normal [facility], [place], normal f/u 12 months  Procedures:  No procedures performed Allergies: Patient has no known allergies.   Assessment / Plan:     Visit Diagnoses: SLE (systemic lupus erythematosus related syndrome) (HCC)  Lupus nephritis  (HCC) - Plan: Anti-DNA antibody, double-stranded, Sedimentation rate  No extrarenal disease activity.  Latest parameters showing complete renal response.  Will recheck serology see if he is seroconverted as double-stranded ENA was trending downwards since addition of the Benlysta.  Unless there is unexpected change in labs I think we can safely start to taper some of his medication.  Will discuss with Dr. Marisue Humble for reduction of prednisone versus mycophenolate with plan to keep  the hydroxychloroquine and Benlysta unchanged for now.  High risk medication use - Hydroxychloroquine 200 mg, Benlysta 200 mg subcu weekly, and mycophenolate 1500 mg twice daily. PLQ Eye Exam 10/18/2022 normal - Plan: CBC with Differential/Platelet, COMPLETE METABOLIC PANEL WITH GFR, QuantiFERON-TB Gold Plus  Checking CBC and CMP for medication monitoring on continued use of CellCept and Benlysta.  Updating annual TB screening.  Long term (current) use of systemic steroids - prednisone 5 mg daily    Orders: Orders Placed This Encounter  Procedures   Anti-DNA antibody, double-stranded   Sedimentation rate   CBC with Differential/Platelet   COMPLETE METABOLIC PANEL WITH GFR   QuantiFERON-TB Gold Plus   No orders of the defined types were placed in this encounter.    Follow-Up Instructions: Return in about 3 months (around 05/29/2023) for SLE on BLY/MMF/HCQ f/u 3mos.   Fuller Plan, MD  Note - This record has been created using [employer].  Chart creation errors have been sought, but may not always  have been located. Such creation errors do not reflect on  the standard of medical care.

## 2023-02-26 ENCOUNTER — Encounter: Payer: Self-pay | Admitting: Internal Medicine

## 2023-02-26 ENCOUNTER — Ambulatory Visit: Payer: BC Managed Care – PPO | Attending: Internal Medicine | Admitting: Internal Medicine

## 2023-02-26 VITALS — BP 101/67 | HR 58 | Resp 14 | Ht 70.0 in | Wt 177.0 lb

## 2023-02-26 DIAGNOSIS — M3214 Glomerular disease in systemic lupus erythematosus: Secondary | ICD-10-CM | POA: Diagnosis not present

## 2023-02-26 DIAGNOSIS — Z7952 Long term (current) use of systemic steroids: Secondary | ICD-10-CM

## 2023-02-26 DIAGNOSIS — M329 Systemic lupus erythematosus, unspecified: Secondary | ICD-10-CM | POA: Diagnosis not present

## 2023-02-26 DIAGNOSIS — Z79899 Other long term (current) drug therapy: Secondary | ICD-10-CM | POA: Diagnosis not present

## 2023-02-28 LAB — CBC WITH DIFFERENTIAL/PLATELET
Absolute Lymphocytes: 929 {cells}/uL (ref 850–3900)
Absolute Monocytes: 754 {cells}/uL (ref 200–950)
Basophils Absolute: 37 {cells}/uL (ref 0–200)
Basophils Relative: 0.4 %
Eosinophils Absolute: 28 {cells}/uL (ref 15–500)
Eosinophils Relative: 0.3 %
HCT: 47.7 % (ref 38.5–50.0)
Hemoglobin: 15.9 g/dL (ref 13.2–17.1)
MCH: 29.4 pg (ref 27.0–33.0)
MCHC: 33.3 g/dL (ref 32.0–36.0)
MCV: 88.2 fL (ref 80.0–100.0)
MPV: 10.1 fL (ref 7.5–12.5)
Monocytes Relative: 8.2 %
Neutro Abs: 7452 {cells}/uL (ref 1500–7800)
Neutrophils Relative %: 81 %
Platelets: 336 10*3/uL (ref 140–400)
RBC: 5.41 10*6/uL (ref 4.20–5.80)
RDW: 12.9 % (ref 11.0–15.0)
Total Lymphocyte: 10.1 %
WBC: 9.2 10*3/uL (ref 3.8–10.8)

## 2023-02-28 LAB — COMPLETE METABOLIC PANEL WITH GFR
AG Ratio: 2.1 (calc) (ref 1.0–2.5)
ALT: 41 U/L (ref 9–46)
AST: 29 U/L (ref 10–35)
Albumin: 4.4 g/dL (ref 3.6–5.1)
Alkaline phosphatase (APISO): 73 U/L (ref 35–144)
BUN: 21 mg/dL (ref 7–25)
CO2: 28 mmol/L (ref 20–32)
Calcium: 9.4 mg/dL (ref 8.6–10.3)
Chloride: 99 mmol/L (ref 98–110)
Creat: 1.23 mg/dL (ref 0.70–1.35)
Globulin: 2.1 g/dL (ref 1.9–3.7)
Glucose, Bld: 89 mg/dL (ref 65–99)
Potassium: 5 mmol/L (ref 3.5–5.3)
Sodium: 135 mmol/L (ref 135–146)
Total Bilirubin: 0.5 mg/dL (ref 0.2–1.2)
Total Protein: 6.5 g/dL (ref 6.1–8.1)
eGFR: 67 mL/min/{1.73_m2} (ref >=60)

## 2023-02-28 LAB — QUANTIFERON-TB GOLD PLUS
Mitogen-NIL: 5.64 [IU]/mL
NIL: 0.02 [IU]/mL
QuantiFERON-TB Gold Plus: NEGATIVE
TB1-NIL: 0 [IU]/mL
TB2-NIL: 0 [IU]/mL

## 2023-02-28 LAB — ANTI-DNA ANTIBODY, DOUBLE-STRANDED: ds DNA Ab: 61 [IU]/mL — ABNORMAL HIGH

## 2023-02-28 LAB — SEDIMENTATION RATE: Sed Rate: 14 mm/h (ref 0–20)

## 2023-02-28 NOTE — Progress Notes (Signed)
dsDNA decreased even more now down to 61 from 121 last year. I recommend he can try stopping the 5 mg prednisone but keep other medications the same and we will monitor how that goes.

## 2023-03-06 ENCOUNTER — Other Ambulatory Visit: Payer: Self-pay | Admitting: Internal Medicine

## 2023-03-06 DIAGNOSIS — Z79899 Other long term (current) drug therapy: Secondary | ICD-10-CM

## 2023-03-06 DIAGNOSIS — M329 Systemic lupus erythematosus, unspecified: Secondary | ICD-10-CM

## 2023-03-06 DIAGNOSIS — M3214 Glomerular disease in systemic lupus erythematosus: Secondary | ICD-10-CM

## 2023-03-06 MED ORDER — BENLYSTA 200 MG/ML SUBCUTANEOUS AUTO-INJECTOR
SUBCUTANEOUS | 5 refills | 28.00000 days
Start: 2023-03-06 — End: ?

## 2023-03-06 MED ORDER — BELIMUMAB 200 MG/ML SUBCUTANEOUS AUTO-INJECTOR
SUBCUTANEOUS | 5 refills | 28 days
Start: 2023-03-06 — End: ?

## 2023-03-06 NOTE — Unmapped (Signed)
[facility] Delivery Pharm[facility] Refill Coordination Note    Specialty Medication(s) to be Shipped:   Inflammatory Disorders: Benlysta    Other medication(s) to be shipped: No additional medications requested for fill at this time     Miguel Sharp, DOB: [birth year]  Phone: [phone] (home) [phone] (work)      All above HIPAA information was verified with patient.     Was a Nurse, learning disability used for this call? No    Completed refill call assessment today to schedule patient's medication shipment from the [facility] and Home Delivery Pharmacy  [phone]).  All relevant notes have been reviewed.     Specialty medication(s) and dose(s) confirmed: Regimen is correct and unchanged.   Changes to medications: Miguel Sharp reports no changes at this time.  Changes to insurance: No  New side effects reported not previously addressed with a pharmacist or physician: None reported  Questions for the pharmacist: No    Confirmed patient received a Conservation officer, historic buildings and a Surveyor, mining with first shipment. The patient will receive a drug information handout for each medication shipped and additional FDA Medication Guides as required.       DISEASE/MEDICATION-SPECIFIC INFORMATION        For patients on injectable medications: Patient currently has 0 doses left.  Next injection is scheduled for 03/17/23.    SPECIALTY MEDICATION ADHERENCE     Medication Adherence    Patient reported X missed doses in the last month: 0  Specialty Medication: BENLYSTA 200 mg/mL Atin (belimumab)  Patient is on additional specialty medications: No  Patient is on more than two specialty medications: No  Any gaps in refill history greater than 2 weeks in the last 3 months: no  Demonstrates understanding of importance of adherence: yes              Were doses missed due to medication being on hold? No    BENLYSTA 200 mg/ml: 0 days of medicine on hand       REFERRAL TO PHARMACIST     Referral to the pharmacist: Not needed      [facility]     Shipping address confirmed in Epic.       Delivery Scheduled: Yes, Expected medication delivery date: 03/14/23.  However, Rx request for refills was sent to the provider as there are none remaining.     Medication will be delivered via UPS to the prescription address in Epic WAM.    Miguel Sharp   [facility] Delivery [facility]  Specialty Technician

## 2023-03-06 NOTE — Telephone Encounter (Signed)
Last Fill: 09/27/2022  Labs: 02/26/2023 dsDNA decreased even more now down to 61 from 121 last year. I recommend he can try stopping the 5 mg prednisone but keep other medications the same and we will monitor how that goes.   TB Gold: 02/26/2023 Negative    Next Visit: 05/30/2023  Last Visit: 02/26/2023  DX: SLE (systemic lupus erythematosus related syndrome)   Current Dose per office note 02/26/2023: Benlysta 200 mg subcu weekly   Okay to refill Benlysta?

## 2023-03-14 MED FILL — BENLYSTA 200 MG/ML SUBCUTANEOUS AUTO-INJECTOR: 28 days supply | Qty: 4 | Fill #0

## 2023-03-14 NOTE — Unmapped (Signed)
Miguel Sharp 's BENLYSTA 200 mg/mL Atin (belimumab) shipment will be rescheduled as a result of a new prescription for the medication has been received.      I have spoken with the patient  via incoming phone call and communicated the delivery change. We will reschedule the medication for the delivery date that the patient agreed upon.  We have confirmed the delivery date as 03/15/2023, via ups.

## 2023-04-09 MED FILL — BENLYSTA 200 MG/ML SUBCUTANEOUS AUTO-INJECTOR: 28 days supply | Qty: 4 | Fill #1

## 2023-04-09 NOTE — Unmapped (Signed)
[facility] Delivery Pharm[facility] Refill Coordination Note    Specialty Medication(s) to be Shipped:   Inflammatory Disorders: Benlysta    Other medication(s) to be shipped: No additional medications requested for fill at this time     Miguel Sharp, DOB: [birth year]  Phone: [phone] (home) [phone] (work)      All above HIPAA information was verified with patient.     Was a Nurse, learning disability used for this call? No    Completed refill call assessment today to schedule patient's medication shipment from the [facility] and Home Delivery Pharmacy  [phone]).  All relevant notes have been reviewed.     Specialty medication(s) and dose(s) confirmed: Regimen is correct and unchanged.   Changes to medications: Miguel Sharp reports no changes at this time.  Changes to insurance: No  New side effects reported not previously addressed with a pharmacist or physician: None reported  Questions for the pharmacist: No    Confirmed patient received a Conservation officer, historic buildings and a Surveyor, mining with first shipment. The patient will receive a drug information handout for each medication shipped and additional FDA Medication Guides as required.       DISEASE/MEDICATION-SPECIFIC INFORMATION        For patients on injectable medications: Patient currently has 0 doses left.  Next injection is scheduled for 04/10/2023.    SPECIALTY MEDICATION ADHERENCE              Were doses missed due to medication being on hold? No    Benlysta 200 mg/ml: 0 doses of medicine on hand       REFERRAL TO PHARMACIST     Referral to the pharmacist: Not needed      [facility]     Shipping address confirmed in Epic.       Delivery Scheduled: Yes, Expected medication delivery date: 04/10/2023.     Medication will be delivered via UPS to the prescription address in Epic WAM.    Miguel Sharp, PharmD   [facility] Delivery [facility]  Specialty Pharmacist

## 2023-05-05 ENCOUNTER — Other Ambulatory Visit: Payer: Self-pay | Admitting: Internal Medicine

## 2023-05-07 NOTE — Telephone Encounter (Signed)
Last Fill: 02/07/2023  Eye exam: 04/02/2023 normal   Labs: 02/26/2023 dsDNA decreased even more now down to 61 from 121 last year. I recommend he can try stopping the 5 mg prednisone but keep other medications the same and we will monitor how that goes.   Next Visit: 05/30/2023  Last Visit: 02/26/2023  DX:SLE (systemic lupus erythematosus related syndrome)   Current Dose per office note 02/26/2023: Hydroxychloroquine 200 mg   Okay to refill Plaquenil?

## 2023-05-08 ENCOUNTER — Telehealth: Payer: Self-pay | Admitting: *Deleted

## 2023-05-08 DIAGNOSIS — M329 Systemic lupus erythematosus, unspecified: Principal | ICD-10-CM

## 2023-05-08 DIAGNOSIS — M3214 Glomerular disease in systemic lupus erythematosus: Principal | ICD-10-CM

## 2023-05-08 NOTE — Unmapped (Signed)
[facility] and Home Delivery Pharmacy Refill Coordination Note    Specialty Medication(s) to be Shipped:   Inflammatory Disorders: Benlysta    Other medication(s) to be shipped: No additional medications requested for fill at this time     Miguel Sharp, DOB: 1960/09/18  Phone: [phone] (home) [phone] (work)      All above HIPAA information was verified with patient.     Was a Nurse, learning disability used for this call? No    Completed refill call assessment today to schedule patient's medication shipment from the [facility] and Home Delivery Pharmacy  [phone]).  All relevant notes have been reviewed.     Specialty medication(s) and dose(s) confirmed: Regimen is correct and unchanged.   Changes to medications: Kainon reports no changes at this time.  Changes to insurance: No  New side effects reported not previously addressed with a pharmacist or physician: None reported  Questions for the pharmacist: No    Confirmed patient received a Conservation officer, historic buildings and a Surveyor, mining with first shipment. The patient will receive a drug information handout for each medication shipped and additional FDA Medication Guides as required.       DISEASE/MEDICATION-SPECIFIC INFORMATION        For patients on injectable medications: Patient currently has 1 doses left.  Next injection is scheduled for 05/11/23.    SPECIALTY MEDICATION ADHERENCE     Medication Adherence    Patient reported X missed doses in the last month: 0  Specialty Medication: Benlysta 200mg /ml  Patient is on additional specialty medications: No              Were doses missed due to medication being on hold? No    Benlysta 200 mg/ml: 1 doses of medicine on hand     REFERRAL TO PHARMACIST     Referral to the pharmacist: Not needed      [facility]     Shipping address confirmed in Epic.       Delivery Scheduled: Yes, Expected medication delivery date: 05/10/23.     Medication will be delivered via UPS to the prescription address in Epic WAM.    Tera Helper, [facility]   [facility] and Home Delivery Pharm[facility]  Specialty Pharmacist

## 2023-05-08 NOTE — Telephone Encounter (Signed)
Patient contacted the office stating he is needing a new PA for his Benlysta.

## 2023-05-09 NOTE — Unmapped (Signed)
Miguel Sharp 's BENLYSTA 200 mg/mL Atin (belimumab) shipment will be delayed as a result of prior authorization being required by the patient's insurance.     I have reached out to the patient  at [phone] and communicated the delay. We will call the patient back to reschedule the delivery upon resolution. We have not confirmed the new delivery date.

## 2023-05-10 ENCOUNTER — Telehealth: Payer: Self-pay

## 2023-05-10 ENCOUNTER — Other Ambulatory Visit (HOSPITAL_COMMUNITY): Payer: Self-pay

## 2023-05-10 NOTE — Telephone Encounter (Signed)
Submitted a Prior Authorization request to  [facility]  for BENLYSTA SQ via CoverMyMeds. Will update once we receive a response.  Key: [id number]

## 2023-05-11 ENCOUNTER — Other Ambulatory Visit: Payer: Self-pay | Admitting: Internal Medicine

## 2023-05-11 DIAGNOSIS — M329 Systemic lupus erythematosus, unspecified: Secondary | ICD-10-CM

## 2023-05-11 DIAGNOSIS — M3214 Glomerular disease in systemic lupus erythematosus: Secondary | ICD-10-CM

## 2023-05-11 DIAGNOSIS — Z79899 Other long term (current) drug therapy: Secondary | ICD-10-CM

## 2023-05-11 MED ORDER — BELIMUMAB 200 MG/ML SUBCUTANEOUS AUTO-INJECTOR
2 refills | 0.00 days
Start: 2023-05-11 — End: ?

## 2023-05-11 MED ORDER — BENLYSTA 200 MG/ML SUBCUTANEOUS AUTO-INJECTOR
2 refills | 0.00 days
Start: 2023-05-11 — End: ?

## 2023-05-11 NOTE — Telephone Encounter (Signed)
Last Fill: 03/06/2023  Labs: 02/26/2023 dsDNA decreased even more now down to 61 from 121 last year. I recommend he can try stopping the 5 mg prednisone but keep other medications the same and we will monitor how that goes.   TB Gold: 02/26/2023 Negative   Next Visit: 05/30/2023  Last Visit: 02/26/2023   DX:SLE (systemic lupus erythematosus related syndrome)   Current Dose per office note 02/26/2023: Benlysta 200 mg subcu weekly   Okay to refill Benlysta?

## 2023-05-16 NOTE — Unmapped (Signed)
[facility] Specialty and Home Delivery Pharmacy Refill Coordination Note    Specialty Medication(s) to be Shipped:   Inflammatory Disorders: Benlysta    Other medication(s) to be shipped: No additional medications requested for fill at this time     Miguel Sharp, DOB: Jan 02, 1961  Phone: [phone] (home) [phone] (work)      All above HIPAA information was verified with patient.     Was a Nurse, learning disability used for this call? No    Completed refill call assessment today to schedule patient's medication shipment from the [facility] and Home Delivery Pharmacy  [phone]).  All relevant notes have been reviewed.     Specialty medication(s) and dose(s) confirmed: Regimen is correct and unchanged.   Changes to medications: Davinci reports no changes at this time.  Changes to insurance: No  New side effects reported not previously addressed with a pharmacist or physician: None reported  Questions for the pharmacist: No    Confirmed patient received a Conservation officer, historic buildings and a Surveyor, mining with first shipment. The patient will receive a drug information handout for each medication shipped and additional FDA Medication Guides as required.       DISEASE/MEDICATION-SPECIFIC INFORMATION        For patients on injectable medications: Patient currently has 0 doses left.  Next injection is scheduled for 05/22/2023.    SPECIALTY MEDICATION ADHERENCE              Were doses missed due to medication being on hold? No    Benlysta 200 mg/ml: 0 doses of medicine on hand       REFERRAL TO PHARMACIST     Referral to the pharmacist: Not needed      [facility]     Shipping address confirmed in Epic.       Delivery Scheduled: Yes, Expected medication delivery date: 05/22/2023.     Medication will be delivered via UPS to the prescription address in Epic WAM.    Miguel Sharp, PharmD   [facility] Special[facility] and Home Delivery Pharmacy  Specialty Pharmacist

## 2023-05-16 NOTE — Progress Notes (Deleted)
 Office Visit Note  Patient: Gerald Lloyd             Date of Birth: [birth year]           MRN: [id number]             PCP: Garnette Gunner, MD Referring: Garnette Gunner, MD Visit Date: 05/30/2023   Subjective:  No chief complaint on file.   History of Present Illness: Gerald Lloyd is a 63 y.o. male here for follow up for SLE with class IV/V nephritis on hydroxychloroquine 200 mg, mycophenolate 1500 mg twice daily, Benlysta 200 mg subcu weekly, and prednisone 5 mg daily.    Previous HPI 02/26/2023 Gerald Lloyd is a 63 y.o. male here for follow up for SLE with class IV/V nephritis on hydroxychloroquine 200 mg, mycophenolate 1500 mg twice daily, Benlysta 200 mg subcu weekly, and prednisone 5 mg daily.  Most recent labs were collected in September Dr. Marisue Humble including urine protein creatinine ratio that was completely normal.  He has had some intermittent upper respiratory symptoms viral infection versus allergies but did not require antibiotics and is doing well today.   Previous HPI 08/22/2022 Gerald Lloyd is a 63 y.o. male here for follow up for SLE with class IV/V nephritis on hydroxychloroquine 200 mg, mycophenolate 1500 mg twice daily, Benlysta 200 mg subcu weekly, and prednisone 5 mg daily.  Since her last visit he has had no significant change in symptoms.  Still notices very easy bruising and spots appearing on his forearms and shins otherwise no new issues.  Discussed possible medication changes with Dr. Verna Czech with upcoming appointment in the next few months with his low blood pressure he does get some lightheadedness or dizziness if standing up too quickly.   Previous HPI 05/22/22 Gerald Lloyd is a 63 y.o. male here for follow up for SLE with class IV/V nephritis on HCQ 200 mg, MMF 1500 mg BID, benlysta 200 mg [place] weekly, and prednisone 5 mg daily. He has not experienced any increase in symptoms. No significant leg swelling no shortness of breath or large  weight change. He has easy bruising on eliquis treatment. He saw Dr. Marisue Humble last week for follow up with labs from 2 weeks ago. No change in treatment recommended based on renal parameters anticipates aiming for 2 years of treatment response before tapering.   Previous HPI 01/06/22 Gerald Lloyd is a 63 y.o. male here for follow up for systemic lupus with lupus nephritis currently on HCQ 200 mg daily MF 1500 mg BID benlysta 200 mg [place] weekly, and prednisone 5 mg daily. He is feeling well today without any new complaints. 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 He remains on eliquis 5 mg BID for anticoagulation. He anticipates next labs visit in about 2 weeks before his follow up with Dr. Marisue Humble. He reports seeing [facility] in [place] with exam for HCQ monitoring which was fine.   Previous HPI 09/23/2021 Gerald Lloyd is a 63 y.o. male here for follow up for systemic lupus with lupus nephritis on HCQ 400 mg daily, MMF 1500 mg BID, Benlysta 200 mg [place] weekly, prednisone 5 mg daily. He is doing well overall without major interval events. He started on low dose lisinopril for continued proteinuria.   Previous HPI 06/24/2021 Gerald Lloyd is a 63 y.o. male here for follow up for systemic lupus with lupus nephritis on HCQ 400 mg daily, cellcept 1500 mg BID, Benlysta  200 mg [place] weekly, prednisone 5 mg daily. He started benlysta with induction dose in January due partial renal response. He has not noticed any problems taking the Benlysta. Leg swelling is doing well.  He has not had any shortness of breath or chest pain.  He does report episodes of burping during the past month these are not every day but are very noticeable when they occur.  He is not having any other problems such as nausea diarrhea abdominal pain or acid reflux.  He had follow-up with Dr. Marisue Humble in the past month reports findings indicated continue to trend in the right direction  but still not a complete renal response.   Previous HPI 03/24/21 Gerald Lloyd is a 63 y.o. male here for follow up for systemic lupus with class IV/V nephritis now on HCQ 400 mg daily, cellcept 1500 mg BID, and prednisone 5 mg daily. Most recent follow up with Dr. Marisue Humble with continued proteinuria partially improving.  He continues having mild bilateral leg swelling no other symptom complaints.   Previous HPI 12/22/20 Gerald Lloyd is a 63 y.o. male with history of lower extremity DVT on eliquis anticoagulation here for new diagnosis systemic lupus.  In February he was evaluated for unilateral left leg swelling with ultrasound exam finding a DVT in the left popliteal vein unprovoked.  There was ongoing local swelling of the left calf for several months.  He then presented to the hospital due to shortness of breath.  Work-up identified bilateral pleural effusions, large circumferential pericardial effusion with tamponade and volume overload, lupus nephritis. He was started with pulse dose steroids, cellcept, and hydroxychloroquine.  And headache very good improvement after starting the high-dose steroids.  He has followed up for this with Dr. Marisue Humble at [facility] on induction therapy for lupus nephritis.  He currently feels that most of his symptoms are much improved he does have a generalized weakness and decreased exertion tolerance ever since his hospital stay. He denies any particular skin rashes, alopecia, arthritis, oral ulcers, or Raynaud's symptoms.  He does not know of any family history of autoimmune disease.  He has never experienced similar symptoms prior to these events.   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/27/2020   Bilateral pleural effusion 11/25/2020   AKI (acute kidney injury) (HCC) 11/24/2020   Pericardial effusion 11/24/2020   Anemia 11/24/2020   Dyspnea 11/24/2020   Essential hypertension 11/24/2020   Acute on chronic diastolic CHF (congestive heart failure) (HCC) 11/23/2020    Past Medical History:  Diagnosis Date   Anemia    DVT (deep venous thrombosis) (HCC)    Hypertension     Family History  Problem Relation Age of Onset   Breast cancer Mother    Colon cancer Mother    Diabetes Father    Healthy Son    Healthy Son    Past Surgical History:  Procedure Laterality  Date   IR THORACENTESIS ASP PLEURAL SPACE W/IMG GUIDE  11/25/2020   IR THORACENTESIS ASP PLEURAL SPACE W/IMG GUIDE  11/26/2020   Social History   Social History Narrative   Not on file   Immunization History  Administered Date(s) Administered   Moderna Sars-Covid-2 Vaccination 07/30/2019, 12/10/2019, 03/25/2020     Objective: Vital Signs: There were no vitals taken for this visit.   Physical Exam   Musculoskeletal Exam: ***  CDAI Exam: CDAI Score: -- Patient Global: --; Provider Global: -- Swollen: --; Tender: -- Joint Exam 05/30/2023   No joint exam has been documented for this visit   There is currently no information documented on the homunculus. Go to the Rheumatology activity and complete the homunculus joint exam.  Investigation: No additional findings.  Imaging: No results found.  Recent Labs: Lab Results  Component Value Date   WBC 9.2 02/26/2023   HGB 15.9 02/26/2023   PLT 336 02/26/2023   NA 135 02/26/2023   K  5.0 02/26/2023   CL 99 02/26/2023   CO2 28 02/26/2023   GLUCOSE 89 02/26/2023   BUN 21 02/26/2023   CREATININE 1.23 02/26/2023   BILITOT 0.5 02/26/2023   ALKPHOS 84 11/14/2022   AST 29 02/26/2023   ALT 41 02/26/2023   PROT 6.5 02/26/2023   ALBUMIN 4.4 11/14/2022   CALCIUM 9.4 02/26/2023   QFTBGOLDPLUS NEGATIVE 02/26/2023    Speciality Comments: PLQ Eye Exam: 04/02/2023 normal [facility], [place]ita [place]bee, normal f/u 6 months  Procedures:  No procedures performed Allergies: Patient has no known allergies.   Assessment / Plan:     Visit Diagnoses: No diagnosis found.  ***  Orders: No orders of the defined types were placed in this encounter.  No orders of the defined types were placed in this encounter.    Follow-Up Instructions: No follow-ups on file.   Metta Clines, RT  Note - This record has been created using [employer].  Chart creation errors have been sought, but may not always  have been located. Such creation errors do not reflect on  the standard of medical care.

## 2023-05-17 NOTE — Telephone Encounter (Signed)
 Received fax from [facility] [employer] that authorization for Benylsta SQ is on file through 05/14/2024. MyChart message sent to patient.  Closing encounter  Geraldene Kleine, PharmD, MPH, BCPS, CPP Clinical Pharmacist (Rheumatology and Pulmonology

## 2023-05-17 NOTE — Telephone Encounter (Signed)
 Received fax from [facility] [employer] stating the the most up-to-date form must be completed for Benlysta . However unable to find Benlysta =specific form.  General PA form completed and faxed with clinicals  Fax: [phone]  Sherry Pennant, PharmD, MPH, BCPS, CPP Clinical Pharmacist (Rheumatology and Pulmonology)

## 2023-05-21 MED FILL — BENLYSTA 200 MG/ML SUBCUTANEOUS AUTO-INJECTOR: 28 days supply | Qty: 4 | Fill #0

## 2023-05-30 ENCOUNTER — Ambulatory Visit: Payer: BC Managed Care – PPO | Admitting: Internal Medicine

## 2023-05-30 DIAGNOSIS — M3214 Glomerular disease in systemic lupus erythematosus: Secondary | ICD-10-CM

## 2023-05-30 DIAGNOSIS — M329 Systemic lupus erythematosus, unspecified: Secondary | ICD-10-CM

## 2023-05-30 DIAGNOSIS — Z79899 Other long term (current) drug therapy: Secondary | ICD-10-CM

## 2023-05-30 DIAGNOSIS — Z7952 Long term (current) use of systemic steroids: Secondary | ICD-10-CM

## 2023-05-31 LAB — BASIC METABOLIC PANEL
BUN: 24 — AB (ref 4–21)
CO2: 25 — AB (ref 13–22)
Chloride: 99 (ref 99–108)
Creatinine: 1.2 (ref 0.6–1.3)
Glucose: 84
Sodium: 138 (ref 137–147)

## 2023-05-31 LAB — HEPATIC FUNCTION PANEL
ALT: 24 U/L (ref 10–40)
AST: 19 (ref 14–40)
Alkaline Phosphatase: 95 (ref 25–125)
Bilirubin, Total: 0.3

## 2023-05-31 LAB — CBC AND DIFFERENTIAL
HCT: 47 (ref 41–53)
Hemoglobin: 15.2 (ref 13.5–17.5)
Platelets: 299 10*3/uL (ref 150–400)
WBC: 7.8

## 2023-05-31 LAB — COMPREHENSIVE METABOLIC PANEL
Calcium: 9.4 (ref 8.7–10.7)
Globulin: 1.9
eGFR: 67

## 2023-05-31 LAB — PROTEIN / CREATININE RATIO, URINE: Creatinine, Urine: 110.2

## 2023-05-31 LAB — CBC: RBC: 5.29 — AB (ref 3.87–5.11)

## 2023-06-05 ENCOUNTER — Other Ambulatory Visit: Payer: Self-pay | Admitting: Internal Medicine

## 2023-06-05 NOTE — Unmapped (Signed)
 [facility] Refill Coordination Note    Specialty Medication(s) to be Shipped:   Inflammatory Disorders: Benlysta and mycophenolate    Other medication(s) to be shipped: No additional medications requested for fill at this time     Miguel Sharp, DOB: 01-Aug-1960  Phone: [phone] (home) [phone] (work)      All above HIPAA information was verified with patient.     Was a Nurse, learning disability used for this call? No    Completed refill call assessment today to schedule patient's medication shipment from the [facility]  [phone]).  All relevant notes have been reviewed.     Specialty medication(s) and dose(s) confirmed: Regimen is correct and unchanged.   Changes to medications: Hiep reports no changes at this time.  Changes to insurance: No  New side effects reported not previously addressed with a pharmacist or physician: None reported  Questions for the pharmacist: No    Confirmed patient received a Conservation officer, historic buildings and a Surveyor, mining with first shipment. The patient will receive a drug information handout for each medication shipped and additional FDA Medication Guides as required.       DISEASE/MEDICATION-SPECIFIC INFORMATION        For patients on injectable medications: Patient currently has 02 doses left.  Next injection is scheduled for 06/19/2023.    SPECIALTY MEDICATION ADHERENCE     Medication Adherence    Patient reported X missed doses in the last month: 0  Specialty Medication: mycophenolate 500 mg tablet (CELLCEPT)  Patient is on additional specialty medications: Yes  Additional Specialty Medications: BENLYSTA 200 mg/mL [place]in (belimumab)  Patient Reported Additional Medication X Missed Doses in the Last Month: 0  Patient is on more than two specialty medications: No              Were doses missed due to medication being on hold? No    Benlysta 200 mg/ml: 02 doses of medicine on hand   mycophenolate 500 mg tablet (CELLCEPT) 7 days of medicine on hand    REFERRAL TO PHARMACIST     Referral to the pharmacist: Not needed      [facility]     Shipping address confirmed in Epic.       Delivery Scheduled: Yes, Expected medication delivery date: 06/13/2023.     Medication will be delivered via UPS to the prescription address in Epic WAM.    [facility]  Specialty Technician

## 2023-06-05 NOTE — Telephone Encounter (Signed)
 Last Fill: 05/07/2023  Eye exam: 04/02/2023 Normal   Labs: 05/31/2023 WNL ([facility])  Next Visit: 07/23/2023  Last Visit: 02/26/2023  DX:SLE (systemic lupus erythematosus related syndrome) (HCC)   Current Dose per office note 02/26/2023: Hydroxychloroquine 200 mg   Okay to refill Plaquenil?

## 2023-06-12 MED FILL — MYCOPHENOLATE MOFETIL 500 MG TABLET: ORAL | 90 days supply | Qty: 540 | Fill #1

## 2023-06-12 MED FILL — BENLYSTA 200 MG/ML SUBCUTANEOUS AUTO-INJECTOR: 28 days supply | Qty: 4 | Fill #1

## 2023-07-04 NOTE — Unmapped (Signed)
 [facility] Refill Coordination Note    Specialty Medication(s) to be Shipped:   Inflammatory Disorders: Benlysta    Other medication(s) to be shipped: No additional medications requested for fill at this time     Miguel Sharp, DOB: 22-May-1960  Phone: [phone] (home) [phone] (work)      All above HIPAA information was verified with patient.     Was a Nurse, learning disability used for this call? No    Completed refill call assessment today to schedule patient's medication shipment from the [facility]  [phone]).  All relevant notes have been reviewed.     Specialty medication(s) and dose(s) confirmed: Regimen is correct and unchanged.   Changes to medications: Drayden reports no changes at this time.  Changes to insurance: No  New side effects reported not previously addressed with a pharmacist or physician: None reported  Questions for the pharmacist: No    Confirmed patient received a Conservation officer, historic buildings and a Surveyor, mining with first shipment. The patient will receive a drug information handout for each medication shipped and additional FDA Medication Guides as required.       DISEASE/MEDICATION-SPECIFIC INFORMATION        For patients on injectable medications: Patient currently has 1 doses left.  Next injection is scheduled for 3/28.    SPECIALTY MEDICATION ADHERENCE     Medication Adherence    Patient reported X missed doses in the last month: 0  Specialty Medication: BENLYSTA 200 mg/mL Atin (belimumab)  Patient is on additional specialty medications: No              Were doses missed due to medication being on hold? No    BENLYSTA 200 mg/mL Atin (belimumab)  : 1 doses of medicine on hand       REFERRAL TO PHARMACIST     Referral to the pharmacist: Not needed      [facility]     Shipping address confirmed in Epic.     Cost and Payment: Patient has a $0 copay, payment information is not required.    Delivery Scheduled: Yes, Expected medication delivery date: 4/2.     Medication will be delivered via UPS to the prescription address in Epic WAM.    [facility]  Specialty Technician

## 2023-07-10 MED FILL — BENLYSTA 200 MG/ML SUBCUTANEOUS AUTO-INJECTOR: 28 days supply | Qty: 4 | Fill #2

## 2023-07-10 NOTE — Progress Notes (Signed)
 Office Visit Note  Patient: Gerald Lloyd             Date of Birth: 10-19-1960           MRN: [id number]             PCP: Catheryn Cluck, MD Referring: Catheryn Cluck, MD Visit Date: 07/23/2023   Subjective:  Follow-up   Discussed the use of AI scribe software for clinical note transcription with the patient, who gave verbal consent to proceed.  History of Present Illness   Gerald Lloyd is a 63 y.o. male here for follow up for SLE with class IV/V nephritis on hydroxychloroquine 200 mg, mycophenolate 500 mg twice daily, Benlysta 200 mg subcu weekly, and prednisone 5 mg daily.    He has been gradually tapering off mycophenolate, initially taking two doses twice a week last month, and now taking one dose twice a day. He has had an excellent response to the medications and is currently on a reduced dose of mycophenolate tapering monthly now at 500 mg BID this month. He was previously taken off prednisone after November  No recent illnesses or upper respiratory infections. He experiences seasonal allergies due to pollen but has not yet started taking Claritin D this season.  He has a patchy rash on his leg, which he describes as similar to contact dermatitis or poison oak exposure. It has been present for a couple of weeks without itching or pain. He has been using over-the-counter hydrocortisone cream on the area without resolution so far.    Previous HPI 02/26/2023 Gerald Lloyd is a 63 y.o. male here for follow up for SLE with class IV/V nephritis on hydroxychloroquine 200 mg, mycophenolate 1500 mg twice daily, Benlysta 200 mg subcu weekly, and prednisone 5 mg daily.  Most recent labs were collected in September Dr. Jearldine Mina including urine protein creatinine ratio that was completely normal.  He has had some intermittent upper respiratory symptoms viral infection versus allergies but did not require antibiotics and is doing well today.   Previous  HPI 08/22/2022 Gerald Lloyd is a 63 y.o. male here for follow up for SLE with class IV/V nephritis on hydroxychloroquine 200 mg, mycophenolate 1500 mg twice daily, Benlysta 200 mg subcu weekly, and prednisone 5 mg daily.  Since her last visit he has had no significant change in symptoms.  Still notices very easy bruising and spots appearing on his forearms and shins otherwise no new issues.  Discussed possible medication changes with Dr. America Bake with upcoming appointment in the next few months with his low blood pressure he does get some lightheadedness or dizziness if standing up too quickly.   Previous HPI 05/22/22 Gerald Lloyd is a 63 y.o. male here for follow up for SLE with class IV/V nephritis on HCQ 200 mg, MMF 1500 mg BID, benlysta 200 mg [place] weekly, and prednisone 5 mg daily. He has not experienced any increase in symptoms. No significant leg swelling no shortness of breath or large weight change. He has easy bruising on eliquis treatment. He saw Dr. Jearldine Mina last week for follow up with labs from 2 weeks ago. No change in treatment recommended based on renal parameters anticipates aiming for 2 years of treatment response before tapering.   Previous HPI 01/06/22 Gerald Lloyd is a 63 y.o. male here for follow up for systemic lupus with lupus nephritis currently on HCQ 200 mg daily MF 1500 mg BID benlysta 200 mg [place] weekly, and prednisone 5 mg daily. He  is feeling well today without any new complaints. 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 He remains on eliquis 5 mg BID for anticoagulation. He anticipates next labs visit in about 2 weeks before his follow up with Dr. Jearldine Mina. He reports seeing [facility] in [place] with exam for HCQ monitoring which was fine.   Previous HPI 09/23/2021 Gerald Lloyd is a 63 y.o. male here for follow up for systemic lupus with lupus nephritis on HCQ 400 mg daily, MMF 1500 mg BID, Benlysta 200 mg [place]  weekly, prednisone 5 mg daily. He is doing well overall without major interval events. He started on low dose lisinopril for continued proteinuria.   Previous HPI 06/24/2021 Gerald Lloyd is a 63 y.o. male here for follow up for systemic lupus with lupus nephritis on HCQ 400 mg daily, cellcept 1500 mg BID, Benlysta 200 mg [place] weekly, prednisone 5 mg daily. He started benlysta with induction dose in January due partial renal response. He has not noticed any problems taking the Benlysta. Leg swelling is doing well.  He has not had any shortness of breath or chest pain.  He does report episodes of burping during the past month these are not every day but are very noticeable when they occur.  He is not having any other problems such as nausea diarrhea abdominal pain or acid reflux.  He had follow-up with Dr. Jearldine Mina in the past month reports findings indicated continue to trend in the right direction but still not a complete renal response.   Previous HPI 03/24/21 Gerald Lloyd is a 63 y.o. male here for follow up for systemic lupus with class IV/V nephritis now on HCQ 400 mg daily, cellcept 1500 mg BID, and prednisone 5 mg daily. Most recent follow up with Dr. Jearldine Mina with continued proteinuria partially improving.  He continues having mild bilateral leg swelling no other symptom complaints.   Previous HPI 12/22/20 Gerald Lloyd is a 63 y.o. male with history of lower extremity DVT on eliquis anticoagulation here for new diagnosis systemic lupus.  In February he was evaluated for unilateral left leg swelling with ultrasound exam finding a DVT in the left popliteal vein unprovoked.  There was ongoing local swelling of the left calf for several months.  He then presented to the hospital due to shortness of breath.  Work-up identified bilateral pleural effusions, large circumferential pericardial effusion with tamponade and volume overload, lupus nephritis. He was started with pulse dose steroids,  cellcept, and hydroxychloroquine.  And headache very good improvement after starting the high-dose steroids.  He has followed up for this with Dr. Jearldine Mina at [facility] on induction therapy for lupus nephritis.  He currently feels that most of his symptoms are much improved he does have a generalized weakness and decreased exertion tolerance ever since his hospital stay. He denies any particular skin rashes, alopecia, arthritis, oral ulcers, or Raynaud's symptoms.  He does not know of any family history of autoimmune disease.  He has never experienced similar symptoms prior to these events.   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  HENT:  Negative for mouth  sores and mouth dryness.   Eyes:  Negative for dryness.  Respiratory:  Negative for shortness of breath.   Cardiovascular:  Negative for chest pain and palpitations.  Gastrointestinal:  Negative for blood in stool, constipation and diarrhea.  Endocrine: Negative for increased urination.  Genitourinary:  Negative for involuntary urination.  Musculoskeletal:  Positive for myalgias and myalgias. Negative for joint pain, gait problem, joint pain, joint swelling, muscle weakness, morning stiffness and muscle tenderness.  Skin:  Positive for rash and sensitivity to sunlight. Negative for color change and hair loss.  Allergic/Immunologic: Negative for susceptible to infections.  Neurological:  Negative for dizziness and headaches.  Hematological:  Negative for swollen glands.  Psychiatric/Behavioral:  Negative for depressed mood and sleep disturbance. The patient is not  nervous/anxious.     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/27/2020   Bilateral pleural effusion 11/25/2020   AKI (acute kidney injury) (HCC) 11/24/2020   Pericardial effusion 11/24/2020   Anemia 11/24/2020   Dyspnea 11/24/2020   Essential hypertension 11/24/2020   Acute on chronic diastolic CHF (congestive heart failure) (HCC) 11/23/2020    Past Medical History:  Diagnosis Date   Anemia    DVT (deep venous thrombosis) (HCC)    Hypertension     Family History  Problem Relation Age of Onset   Breast cancer Mother    Colon cancer Mother    Diabetes Father    Healthy Son    Healthy Son    Past Surgical History:  Procedure Laterality Date   IR THORACENTESIS ASP PLEURAL SPACE W/IMG GUIDE  11/25/2020   IR THORACENTESIS ASP PLEURAL SPACE W/IMG GUIDE  11/26/2020   Social History   Social History Narrative   Not on file   Immunization History  Administered Date(s) Administered   Moderna Sars-Covid-2 Vaccination 07/30/2019, 12/10/2019, 03/25/2020     Objective: Vital Signs: BP 99/65 (BP Location: Left Arm, Patient Position: Sitting, Cuff Size: Normal)   Pulse (!) 54   Resp 14   Ht 5\' 11"  (1.803 m)   Wt 180 lb (81.6 kg)   BMI 25.10 kg/m    Physical Exam HENT:     Mouth/Throat:     Mouth: Mucous membranes are moist.     Pharynx: Oropharynx is clear.  Eyes:     Conjunctiva/sclera: Conjunctivae normal.  Cardiovascular:     Rate and Rhythm: Normal rate and regular rhythm.  Pulmonary:     Effort: Pulmonary effort is normal.     Breath sounds: Normal breath sounds.  Musculoskeletal:     Right lower leg: No edema.     Left lower leg: No edema.  Lymphadenopathy:     Cervical: No cervical adenopathy.  Skin:    General: Skin  is warm and dry.     Findings: Rash present.  Neurological:     Mental Status: He is alert.  Psychiatric:        Mood and Affect: Mood normal.      Musculoskeletal Exam:  Shoulders full ROM no tenderness or swelling Elbows full ROM no tenderness or swelling Wrists full ROM no tenderness or swelling Fingers full ROM no tenderness or swelling Knees full ROM no tenderness or swelling     Investigation: No additional findings.  Imaging: No results  found.  Recent Labs: Lab Results  Component Value Date   WBC 7.8 05/31/2023   HGB 15.2 05/31/2023   PLT 299 05/31/2023   NA 138 05/31/2023   K 5.0 02/26/2023   CL 99 05/31/2023   CO2 25 (A) 05/31/2023   GLUCOSE 89 02/26/2023   BUN 24 (A) 05/31/2023   CREATININE 1.2 05/31/2023   BILITOT 0.5 02/26/2023   ALKPHOS 95 05/31/2023   AST 19 05/31/2023   ALT 24 05/31/2023   PROT 6.5 02/26/2023   ALBUMIN 4.4 11/14/2022   CALCIUM 9.4 05/31/2023   QFTBGOLDPLUS NEGATIVE 02/26/2023    Speciality Comments: PLQ Eye Exam: 04/02/2023 normal [facility], [place], normal f/u 6 months  Procedures:  No procedures performed Allergies: Patient has no known allergies.   Assessment / Plan:     Visit Diagnoses: SLE (systemic lupus erythematosus related syndrome) (HCC) - Appears to be doing great, tapering MMF dose with plan to keep the hydroxychloroquine and Benlysta unchanged for now. Atypical SLE with renal involvement, excellent response to treatment, complete renal response achieved. Plan to taper mycophenolate mofetil to maintain remission.  Lupus disease activity appears to be in complete remission although without conversion to seronegative so far. -Checking double-stranded ENA, metabolic panel, and urine protein creatinine ratio - Agree with current plan continuing mycophenolate taper 500 mg twice daily for remainder of month then off - Continue hydroxychloroquine 200 mg daily - Continue Benlysta 200 mg subcu weekly  Lupus nephritis  (HCC)  High risk medication use - Hydroxychloroquine 200 mg, Benlysta 200 mg subcu weekly, and mycophenolate 1500 mg twice daily. PLQ Eye Exam 10/18/2022 normal Recent labs reviewed with stable blood count and metabolic panel.  No serious interval infections.  Tolerating his medications very well.  Rechecking renal function today more for lupus monitoring than for drug.  Contact Dermatitis Persistent rash resembling contact dermatitis and does not seem typical for lupus symptom. Over-the-counter treatment ineffective for a few weeks. - Prescribe triamcinolone cream for topical use twice daily. - Advise application without washing off for at least 15 minutes.    Orders: Orders Placed This Encounter  Procedures   Anti-DNA antibody, double-stranded   C3 and C4   Protein / creatinine ratio, urine   Basic Metabolic Panel (BMET)   Meds ordered this encounter  Medications   Belimumab (BENLYSTA) 200 MG/ML SOAJ    Sig: Inject the contents of 1 auto-injector (200 mg total) under the skin every seven (7) days.    Dispense:  4 mL    Refill:  2    MAINTENANCE DOSE   triamcinolone ointment (KENALOG) 0.5 %    Sig: Apply 1 Application topically 2 (two) times daily as needed.    Dispense:  15 g    Refill:  0     Follow-Up Instructions: Return in about 3 months (around 10/22/2023) for SLE on HCQ/BLY f/u 3mos.   Matt Song, MD  Note - This record has been created using [employer].  Chart creation errors have been sought, but may not always  have been located. Such creation errors do not reflect on  the standard of medical care.

## 2023-07-23 ENCOUNTER — Ambulatory Visit: Payer: BC Managed Care – PPO | Attending: Internal Medicine | Admitting: Internal Medicine

## 2023-07-23 ENCOUNTER — Encounter: Payer: Self-pay | Admitting: Internal Medicine

## 2023-07-23 VITALS — BP 99/65 | HR 54 | Resp 14 | Ht 71.0 in | Wt 180.0 lb

## 2023-07-23 DIAGNOSIS — M3214 Glomerular disease in systemic lupus erythematosus: Secondary | ICD-10-CM | POA: Diagnosis not present

## 2023-07-23 DIAGNOSIS — Z7952 Long term (current) use of systemic steroids: Secondary | ICD-10-CM

## 2023-07-23 DIAGNOSIS — M329 Systemic lupus erythematosus, unspecified: Secondary | ICD-10-CM

## 2023-07-23 DIAGNOSIS — R21 Rash and other nonspecific skin eruption: Secondary | ICD-10-CM

## 2023-07-23 DIAGNOSIS — Z79899 Other long term (current) drug therapy: Secondary | ICD-10-CM | POA: Diagnosis not present

## 2023-07-23 MED ORDER — BENLYSTA 200 MG/ML ~~LOC~~ SOAJ: SUBCUTANEOUS | 2 refills | Status: DC

## 2023-07-23 MED ORDER — TRIAMCINOLONE ACETONIDE 0.5 % EX OINT: 1.0000 | TOPICAL_OINTMENT | Freq: Two times a day (BID) | CUTANEOUS | 0 refills | Status: DC | PRN

## 2023-07-23 MED ORDER — BENLYSTA 200 MG/ML SUBCUTANEOUS AUTO-INJECTOR
2 refills | 0.00 days
Start: 2023-07-23 — End: ?

## 2023-07-24 LAB — C3 AND C4
C3 Complement: 85 mg/dL (ref 82–185)
C4 Complement: 16 mg/dL (ref 15–53)

## 2023-07-24 LAB — BASIC METABOLIC PANEL WITH GFR
BUN: 19 mg/dL (ref 7–25)
CO2: 30 mmol/L (ref 20–32)
Calcium: 9.5 mg/dL (ref 8.6–10.3)
Chloride: 104 mmol/L (ref 98–110)
Creat: 1.29 mg/dL (ref 0.70–1.35)
Glucose, Bld: 79 mg/dL (ref 65–99)
Potassium: 4.8 mmol/L (ref 3.5–5.3)
Sodium: 138 mmol/L (ref 135–146)
eGFR: 63 mL/min/{1.73_m2} (ref >=60)

## 2023-07-24 LAB — PROTEIN / CREATININE RATIO, URINE
Creatinine, Urine: 237 mg/dL (ref 20–320)
Protein/Creat Ratio: 84 mg/g{creat} (ref 25–148)
Protein/Creatinine Ratio: 0.084 mg/mg{creat} (ref 0.025–0.148)
Total Protein, Urine: 20 mg/dL (ref 5–25)

## 2023-07-24 LAB — ANTI-DNA ANTIBODY, DOUBLE-STRANDED: ds DNA Ab: 74 [IU]/mL — ABNORMAL HIGH

## 2023-07-30 NOTE — Unmapped (Signed)
 [facility] Clinical Assessment & Refill Coordination Note    Miguel Sharp, DOB: [birth year]  Phone: [phone] (home) [phone] (work)    All above HIPAA information was verified with patient.     Was a Nurse, learning disability used for this call? No    Specialty Medication(s):   Inflammatory Disorders: Benlysta      Current Outpatient Medications   Medication Sig Dispense Refill    amLODIPine (NORVASC) 10 MG tablet Take 10 mg by mouth daily.      belimumab  (BENLYSTA ) 200 mg/mL AtIn Inject the contents of 2 pens (400 mg) under the skin once a week for 3 weeks. THEN inject 1 pen ( 200mg ) under the skin every week thereafter. 8 mL 0    belimumab  (BENLYSTA ) 200 mg/mL AtIn Inject the contents of 1 auto-injector (200 mg total) under the skin every seven (7) days. 4 mL 2    ELIQUIS 5 mg Tab Take 5 mg by mouth Two (2) times a day.      empty container Misc USE AS DIRECTED 1 each 2    furosemide (LASIX) 80 MG tablet Take 80 mg by mouth Two (2) times a day.      hydrOXYchloroQUINE (PLAQUENIL) 200 mg tablet Take 200 mg by mouth daily.      mycophenolate  (CELLCEPT ) 500 mg tablet Take 3 tablets (1,500 mg total) by mouth two (2) times a day. 540 tablet 3     No current facility-administered medications for this visit.        Changes to medications: [name] reports no changes at this time.    Medication list has been reviewed and updated in Epic: Yes    No Known Allergies    Changes to allergies: No    Allergies have been reviewed and updated in Epic: Yes    SPECIALTY MEDICATION ADHERENCE     Benlysta  200mg /mL : 0 doses of medicine on hand     Medication Adherence    Patient reported X missed doses in the last month: 0  Specialty Medication: Benlysta  200mg /mL  Informant: patient  Confirmed plan for next specialty medication refill: delivery by pharmacy  Refills needed for supportive medications: not needed          Specialty medication(s) dose(s) confirmed: Regimen is correct and unchanged.     Are there any concerns with adherence? No    Adherence counseling provided? Not needed    CLINICAL MANAGEMENT AND INTERVENTION      Clinical Benefit Assessment:    Do you feel the medicine is effective or helping your condition? Yes    Clinical Benefit counseling provided? Not needed    Adverse Effects Assessment:    Are you experiencing any side effects? No    Are you experiencing difficulty administering your medicine? No    Quality of Life Assessment:    Quality of Life    Rheumatology  1. What impact has your specialty medication had on the reduction of your daily pain level?: Some  2. What impact has your specialty medication had on your ability to complete daily tasks (prepare meals, get dressed, etc...)?: Some  Oncology  Dermatology  Cystic Fibrosis          How many days over the past month did your condition  keep you from your normal activities? For example, brushing your teeth or getting up in the morning. 0    Have you discussed this with your provider? Not needed    Acute Infection Status:  Acute infections noted within Epic:  No active infections    Patient reported infection: None    Therapy Appropriateness:    Is therapy appropriate based on current medication list, adverse reactions, adherence, clinical benefit and progress toward achieving therapeutic goals? Yes, therapy is appropriate and should be continued     Clinical Intervention:    Was an intervention completed as part of this clinical assessment? No    DISEASE/MEDICATION-SPECIFIC INFORMATION      For patients on injectable medications: Patient currently has 0 doses left.  Next injection is scheduled for 08/03/2023.    Chronic Inflammatory Diseases: Have you experienced any flares in the last month? No  Has this been reported to your provider? No    PATIENT SPECIFIC NEEDS     Does the patient have any physical, cognitive, or cultural barriers? No    Is the patient high risk? No    Does the patient require physician intervention or other additional services (i.e., nutrition, smoking cessation, social work)? No    Does the patient have an additional or emergency contact listed in their chart? Yes    SOCIAL DETERMINANTS OF HEALTH     At the [facility], we have learned that life circumstances - like trouble affording food, housing, utilities, or transportation can affect the health of many of our patients.   That is why we wanted to ask: are you currently experiencing any life circumstances that are negatively impacting your health and/or quality of life? No    Social Drivers of Health     Food Insecurity: Not on file   Tobacco Use: Low Risk  (02/26/2023)    Received from [facility]    Patient History     Smoking Tobacco Use: Never     Smokeless Tobacco Use: Never     Passive Exposure: Never   Transportation Needs: Not on file   Alcohol Use: Not on file   Housing: Not on file   Physical Activity: Not on file   Utilities: Not on file   Stress: Not on file   Interpersonal Safety: Not on file   Substance Use: Not on file (02/14/2023)   Intimate Partner Violence: Not on file   Social Connections: Not on file   Financial Resource Strain: Not on file   Depression: Not on file   Internet Connectivity: Not on file   Health Literacy: Not on file       Would you be willing to receive help with any of the needs that you have identified today? No       SHIPPING     Specialty Medication(s) to be Shipped:   Inflammatory Disorders: Benlysta     Other medication(s) to be shipped: No additional medications requested for fill at this time     Changes to insurance: No    Cost and Payment: Patient has a $0 copay, payment information is not required.    Delivery Scheduled: Yes, Expected medication delivery date: 08/01/2023.     Medication will be delivered via UPS to the confirmed prescription address in [facility].    The patient will receive a drug information handout for each medication shipped and additional FDA Medication Guides as required.  Verified that patient has previously received a Conservation officer, historic buildings and a Surveyor, mining.    The patient or caregiver noted above participated in the development of this care plan and knows that they can request review of or adjustments to the care plan at any  time.      All of the patient's questions and concerns have been addressed.    Court Distance, PharmD   [facility] Specialty Pharmacist

## 2023-07-31 MED FILL — BENLYSTA 200 MG/ML SUBCUTANEOUS AUTO-INJECTOR: 28 days supply | Qty: 4 | Fill #0

## 2023-08-30 NOTE — Unmapped (Signed)
 [facility] Refill Coordination Note    Specialty Medication(s) to be Shipped:   Inflammatory Disorders: Benlysta    Other medication(s) to be shipped: No additional medications requested for fill at this time     Miguel Sharp, DOB: 02-05-1961  Phone: [phone] (home) [phone] (work)      All above HIPAA information was verified with patient.     Was a Nurse, learning disability used for this call? No    Completed refill call assessment today to schedule patient's medication shipment from the [facility]  [phone]).  All relevant notes have been reviewed.     Specialty medication(s) and dose(s) confirmed: Regimen is correct and unchanged.   Changes to medications: [name] reports no changes at this time.  Changes to insurance: No  New side effects reported not previously addressed with a pharmacist or physician: None reported  Questions for the pharmacist: No    Confirmed patient received a Conservation officer, historic buildings and a Surveyor, mining with first shipment. The patient will receive a drug information handout for each medication shipped and additional FDA Medication Guides as required.       DISEASE/MEDICATION-SPECIFIC INFORMATION        For patients on injectable medications: Patient currently has 2 doses left.  Next injection is scheduled for 08/31/23.    SPECIALTY MEDICATION ADHERENCE     Medication Adherence    Patient reported X missed doses in the last month: 0  Specialty Medication: Benlysta 200mg /mL  Patient is on additional specialty medications: No  Informant: patient     Were doses missed due to medication being on hold? No    Benlysta 200 mg/mL: 2 doses of medicine on hand       REFERRAL TO PHARMACIST     Referral to the pharmacist: Not needed      [facility]     Shipping address confirmed in Epic.     Cost and Payment: Patient has a $0 copay, payment information is not required.    Delivery Scheduled: Yes, Expected medication delivery date: 09/11/23.     Medication will be delivered via UPS to the prescription address in Epic [place].    Miguel Sharp   [facility]  Specialty Technician

## 2023-09-02 ENCOUNTER — Emergency Department
Admit: 2023-09-02 | Discharge: 2023-09-02 | Disposition: A | Discharge status: Home / Self Care | Payer: BLUE CROSS/BLUE SHIELD

## 2023-09-02 DIAGNOSIS — N139 Obstructive and reflux uropathy, unspecified: Principal | ICD-10-CM

## 2023-09-02 LAB — CBC W/ AUTO DIFF
BASOPHILS ABSOLUTE COUNT: 0 10*9/L (ref 0.0–0.2)
BASOPHILS RELATIVE PERCENT: 0.2 %
EOSINOPHILS ABSOLUTE COUNT: 0 10*9/L (ref 0.0–0.4)
EOSINOPHILS RELATIVE PERCENT: 0.1 %
HEMATOCRIT: 49.6 % (ref 36.0–50.0)
HEMOGLOBIN: 17 g/dL (ref 12.5–17.0)
LYMPHOCYTES ABSOLUTE COUNT: 0.6 10*9/L — ABNORMAL LOW (ref 0.7–4.5)
LYMPHOCYTES RELATIVE PERCENT: 3.5 %
MEAN CORPUSCULAR HEMOGLOBIN CONC: 34.3 g/dL (ref 32.0–36.0)
MEAN CORPUSCULAR HEMOGLOBIN: 28.8 pg (ref 27.0–34.0)
MEAN CORPUSCULAR VOLUME: 83.9 fL (ref 80.0–98.0)
MEAN PLATELET VOLUME: 9.7 fL (ref 7.4–10.4)
MONOCYTES ABSOLUTE COUNT: 0.7 10*9/L (ref 0.1–1.0)
MONOCYTES RELATIVE PERCENT: 4.4 %
NEUTROPHILS ABSOLUTE COUNT: 15.3 10*9/L — ABNORMAL HIGH (ref 1.8–7.8)
NEUTROPHILS RELATIVE PERCENT: 91.3 %
PLATELET COUNT: 330 10*9/L (ref 140–415)
RED BLOOD CELL COUNT: 5.91 10*12/L — ABNORMAL HIGH (ref 4.10–5.60)
RED CELL DISTRIBUTION WIDTH: 12.8 % (ref 11.5–14.5)
WBC ADJUSTED: 16.8 10*9/L — ABNORMAL HIGH (ref 4.0–10.5)

## 2023-09-02 LAB — URINALYSIS WITH MICROSCOPY WITH CULTURE REFLEX PERFORMABLE
BACTERIA: NONE SEEN /HPF
BILIRUBIN UA: NEGATIVE
GLUCOSE UA: NEGATIVE
KETONES UA: NEGATIVE
LEUKOCYTE ESTERASE UA: NEGATIVE
NITRITE UA: NEGATIVE
PH UA: 6.5 (ref 5.0–8.0)
PROTEIN UA: NEGATIVE
RBC UA: 2 /HPF (ref 0–5)
SPECIFIC GRAVITY UA: <=1.005 — ABNORMAL LOW (ref 1.010–1.025)
SQUAMOUS EPITHELIAL: 2 /HPF (ref 0–10)
UROBILINOGEN UA: 0.2
WBC UA: 0 /HPF (ref <=5)

## 2023-09-02 LAB — COMPREHENSIVE METABOLIC PANEL
ALBUMIN: 4.6 g/dL (ref 3.5–5.0)
ALKALINE PHOSPHATASE: 112 U/L (ref 46–116)
ALT (SGPT): 37 U/L (ref 12–78)
ANION GAP: 15 mmol/L — ABNORMAL HIGH (ref 3–11)
AST (SGOT): 25 U/L (ref 15–40)
BILIRUBIN TOTAL: 0.8 mg/dL (ref 0.3–1.2)
BLOOD UREA NITROGEN: 21 mg/dL — ABNORMAL HIGH (ref 8–20)
BUN / CREAT RATIO: 14
CALCIUM: 9.3 mg/dL (ref 8.5–10.1)
CHLORIDE: 100 mmol/L (ref 98–107)
CO2: 23.8 mmol/L (ref 21.0–32.0)
CREATININE: 1.51 mg/dL — ABNORMAL HIGH (ref 0.80–1.30)
EGFR CKD-EPI (2021) MALE: 53 mL/min/1.73m2 — ABNORMAL LOW (ref >=60)
GLUCOSE RANDOM: 143 mg/dL (ref 70–179)
POTASSIUM: 3.4 mmol/L — ABNORMAL LOW (ref 3.5–5.0)
PROTEIN TOTAL: 7.9 g/dL (ref 6.0–8.0)
SODIUM: 139 mmol/L (ref 135–145)

## 2023-09-02 LAB — PSA: PROSTATE SPECIFIC ANTIGEN: 14.24 ng/mL — ABNORMAL HIGH (ref 0.00–4.00)

## 2023-09-02 MED ORDER — TAMSULOSIN 0.4 MG CAPSULE
ORAL_CAPSULE | Freq: Every day | ORAL | 0 refills | 5.00000 days | Status: CP
Start: 2023-09-02 — End: 2023-09-02

## 2023-09-02 MED ADMIN — tamsulosin (FLOMAX) 24 hr capsule 0.4 mg: .4 mg | ORAL | @ 11:00:00 | Stop: 2023-09-02

## 2023-09-02 NOTE — Unmapped (Signed)
 [facility]   Emergency Department Provider Note         History     Chief Complaint  Abdominal Pain and Dysuria      HPI   Miguel Sharp is a 63 y.o. male had abdominal distention discomfort in the suprapubic area.  Patient has a history of lupus nephritis.  Patient states that this abdominal distention suprapubic area started last night and then worsened overnight and relieved when Foley catheter was inserted by nursing staff when he got here.  He does not have any discomfort in the abdomen since the catheter was placed and he had a total of 700 mL of urine on placement of the catheter immediately.  Patient denies any fever chills nausea vomiting.  Patient has had episodes of going to urinate during nighttime or and it wakes him up around 2 or 3 times during the night for the last 1 year.  Patient does not know if he has had a PSA test done in the past no history of any prostate enlargement or prostate cancer in the past.  Patient did not have any fever chills nausea vomiting.  No testicular or scrotal pain.  He did not see any blood in the urine recently.        Past Medical History:   Diagnosis Date    Diabetes mellitus       Hypertension     Lupus        No past surgical history on file.    Prior to Admission medications    Medication Dose, Route, Frequency   amLODIPine (NORVASC) 10 MG tablet 10 mg, Oral, Daily (standard)   belimumab (BENLYSTA) 200 mg/mL AtIn Inject the contents of 2 pens (400 mg) under the skin once a week for 3 weeks. THEN inject 1 pen ( 200mg ) under the skin every week thereafter.   belimumab (BENLYSTA) 200 mg/mL AtIn Inject the contents of 1 auto-injector (200 mg total) under the skin every seven (7) days.   ELIQUIS 5 mg Tab 5 mg, Oral, 2 times a day (standard)   empty container Misc USE AS DIRECTED   furosemide (LASIX) 80 MG tablet 80 mg, Oral, 2 times a day (standard)   hydrOXYchloroQUINE (PLAQUENIL) 200 mg tablet 200 mg, Oral, Daily (standard)   lisinopril (PRINIVIL,ZESTRIL) 2.5 MG tablet 1.25 mg, Oral, Daily (standard)   mycophenolate (CELLCEPT) 500 mg tablet 1,500 mg, Oral, 2 times a day       Allergies  Patient has no known allergies.      Social History     Tobacco Use    Smoking status: Never    Smokeless tobacco: Never   Vaping Use    Vaping status: Never Used   Substance Use Topics    Alcohol use: Yes    Drug use: Never       Review of Systems   Gastrointestinal:  Positive for abdominal pain.   All other systems reviewed and are negative.      As in HPI, all systems reviewed and otherwise negative.    Physical Exam      Vitals:    09/02/23 0404   BP: 154/89   Pulse: 72   Resp: 24   Temp: 36.3 ??C (97.3 ??F)   TempSrc: Skin   SpO2: 100%   Weight: 80.7 kg (178 lb)   Height: 177.8 cm (5' 10)         Physical Exam   Constitutional: Patient appears well-developed and well  nourished. Non toxic in appearance.  HEENT: Unremarkable.  Head: Atraumatic.   Eyes: Normal ocular movements.  Neck: Supple with normal range of motion.   Pulmonary/Chest: Effort normal. No respiratory distress.  Abdominal: Soft and non tender abdomen.  The catheter was already in place by the time I saw the patient.  The catheter had been placed by the nursing staff.  Total of 1 L of urine was in the bag.  Patient states he does not have any pain or discomfort subjectively and on physical examination there is no distention noted.  There is no pulsating mass.  There is no tenderness no peritoneal signs of the in the entire abdomen.  No CVA tenderness.  Musculoskeletal: Extremities atraumatic.  Neurological: Alert with no focal neurological deficit. Ambulatory with a steady gait.  Skin: Warm and dry.    Nursing note and vital signs reviewed.     ED Course              Procedures    Medications ordered during this encounter   Medications    tamsulosin (FLOMAX) 24 hr capsule 0.4 mg       ED Results  Results for orders placed or performed during the hospital encounter of 09/02/23   Comprehensive Metabolic Panel   Result Value Ref Range    Sodium 139 135 - 145 mmol/L    Potassium 3.4 (L) 3.5 - 5.0 mmol/L    Chloride 100 98 - 107 mmol/L    CO2 23.8 21.0 - 32.0 mmol/L    Anion Gap 15 (H) 3 - 11 mmol/L    BUN 21 (H) 8 - 20 mg/dL    Creatinine 1.61 (H) 0.80 - 1.30 mg/dL    BUN/Creatinine Ratio 14     eGFR CKD-EPI (2021) Male 53 (L) >=60 mL/min/1.11m2    Glucose 143 70 - 179 mg/dL    Calcium 9.3 8.5 - 09.6 mg/dL    Albumin 4.6 3.5 - 5.0 g/dL    Total Protein 7.9 6.0 - 8.0 g/dL    Total Bilirubin 0.8 0.3 - 1.2 mg/dL    AST 25 15 - 40 U/L    ALT 37 12 - 78 U/L    Alkaline Phosphatase 112 46 - 116 U/L   CBC w/ Differential   Result Value Ref Range    WBC 16.8 (H) 4.0 - 10.5 10*9/L    RBC 5.91 (H) 4.10 - 5.60 10*12/L    HGB 17.0 12.5 - 17.0 g/dL    HCT 04.5 40.9 - 81.1 %    MCV 83.9 80.0 - 98.0 fL    MCH 28.8 27.0 - 34.0 pg    MCHC 34.3 32.0 - 36.0 g/dL    RDW 91.4 78.2 - 95.6 %    MPV 9.7 7.4 - 10.4 fL    Platelet 330 140 - 415 10*9/L    Neutrophils % 91.3 %    Lymphocytes % 3.5 %    Monocytes % 4.4 %    Eosinophils % 0.1 %    Basophils % 0.2 %    Absolute Neutrophils 15.3 (H) 1.8 - 7.8 10*9/L    Absolute Lymphocytes 0.6 (L) 0.7 - 4.5 10*9/L    Absolute Monocytes 0.7 0.1 - 1.0 10*9/L    Absolute Eosinophils 0.0 0.0 - 0.4 10*9/L    Absolute Basophils 0.0 0.0 - 0.2 10*9/L       Radiology  No results found.     Medical Decision Making     I have reviewed  the vital signs and the nursing notes. Labs and radiology results that were available during my care of the patient were independently reviewed by me and considered in my medical decision making.       This is a 63 year old male who comes in with abdominal distention and discomfort in the suprapubic area relieved immediately after placing a Foley catheter with 700 mL of urine.  He on examination has no tenderness no pulsating mass no CVA tenderness.  Subjectively he has no nausea vomiting.  No fever chills subjectively.  His vitals are stable.  His creatinine is elevated at 1.5.  He has a history of lupus nephritis and has been taking hydroxychloroquine and CellCept.    Medical Decision Making       Differential Diagnosis: Enlarged prostate, BPH, UTI,        ED Clinical Impression     Final diagnoses:   Urinary (tract) obstruction (Primary)         Procedures           This record has been created using [employer]. Chart creation errors have been sought, but may not always have been located. Such creation errors do not reflect on the standard of medical care.     Renald Carol, MD  09/02/23 1640

## 2023-09-02 NOTE — Unmapped (Signed)
 Pt c/o RLQ pain radiating to back. Pt states that he is unable to urinate. Pt states that he laid down at 2130 without symptoms and woke up at an unknown time and symptoms began.

## 2023-09-02 NOTE — ED Provider Notes (Signed)
 [facility]   Emergency Department Provider Note         History     Chief Complaint  Abdominal Pain and Dysuria      HPI   Gerald Lloyd is a 63 y.o. male had abdominal distention discomfort in the suprapubic area.  Patient has a history of lupus nephritis.  Patient states that this abdominal distention suprapubic area started last night and then worsened overnight and relieved when Foley catheter was inserted by nursing staff when he got here.  He does not have any discomfort in the abdomen since the catheter was placed and he had a total of 700 mL of urine on placement of the catheter immediately.  Patient denies any fever chills nausea vomiting.  Patient has had episodes of going to urinate during nighttime or and it wakes him up around 2 or 3 times during the night for the last 1 year.  Patient does not know if he has had a PSA test done in the past no history of any prostate enlargement or prostate cancer in the past.  Patient did not have any fever chills nausea vomiting.  No testicular or scrotal pain.  He did not see any blood in the urine recently.        Past Medical History:   Diagnosis Date    Diabetes mellitus       Hypertension     Lupus        No past surgical history on file.    Prior to Admission medications    Medication Dose, Route, Frequency   amLODIPine (NORVASC) 10 MG tablet 10 mg, Oral, Daily (standard)   belimumab (BENLYSTA) 200 mg/mL AtIn Inject the contents of 2 pens (400 mg) under the skin once a week for 3 weeks. THEN inject 1 pen ( 200mg ) under the skin every week thereafter.   belimumab (BENLYSTA) 200 mg/mL AtIn Inject the contents of 1 auto-injector (200 mg total) under the skin every seven (7) days.   ELIQUIS 5 mg Tab 5 mg, Oral, 2 times a day (standard)   empty container Misc USE AS DIRECTED   furosemide (LASIX) 80 MG tablet 80 mg, Oral, 2 times a day (standard)   hydrOXYchloroQUINE (PLAQUENIL) 200 mg tablet 200 mg, Oral, Daily (standard)   lisinopril (PRINIVIL,ZESTRIL) 2.5 MG tablet 1.25 mg, Oral, Daily (standard)   mycophenolate (CELLCEPT) 500 mg tablet 1,500 mg, Oral, 2 times a day       Allergies  Patient has no known allergies.      Social History     Tobacco Use    Smoking status: Never    Smokeless tobacco: Never   Vaping Use    Vaping status: Never Used   Substance Use Topics    Alcohol use: Yes    Drug use: Never       Review of Systems   Gastrointestinal:  Positive for abdominal pain.   All other systems reviewed and are negative.      As in HPI, all systems reviewed and otherwise negative.    Physical Exam      Vitals:    09/02/23 0404   BP: 154/89   Pulse: 72   Resp: 24   Temp: 36.3 ??C (97.3 ??F)   TempSrc: Skin   SpO2: 100%   Weight: 80.7 kg (178 lb)   Height: 177.8 cm (5' 10)         Physical Exam   Constitutional: Patient appears well-developed and well  nourished. Non toxic in appearance.  HEENT: Unremarkable.  Head: Atraumatic.   Eyes: Normal ocular movements.  Neck: Supple with normal range of motion.   Pulmonary/Chest: Effort normal. No respiratory distress.  Abdominal: Soft and non tender abdomen.  The catheter was already in place by the time I saw the patient.  The catheter had been placed by the nursing staff.  Total of 1 L of urine was in the bag.  Patient states he does not have any pain or discomfort subjectively and on physical examination there is no distention noted.  There is no pulsating mass.  There is no tenderness no peritoneal signs of the in the entire abdomen.  No CVA tenderness.  Musculoskeletal: Extremities atraumatic.  Neurological: Alert with no focal neurological deficit. Ambulatory with a steady gait.  Skin: Warm and dry.    Nursing note and vital signs reviewed.     ED Course              Procedures    Medications ordered during this encounter   Medications    tamsulosin (FLOMAX) 24 hr capsule 0.4 mg       ED Results  Results for orders placed or performed during the hospital encounter of 09/02/23   Comprehensive Metabolic Panel   Result Value Ref Range    Sodium 139 135 - 145 mmol/L    Potassium 3.4 (L) 3.5 - 5.0 mmol/L    Chloride 100 98 - 107 mmol/L    CO2 23.8 21.0 - 32.0 mmol/L    Anion Gap 15 (H) 3 - 11 mmol/L    BUN 21 (H) 8 - 20 mg/dL    Creatinine 1.61 (H) 0.80 - 1.30 mg/dL    BUN/Creatinine Ratio 14     eGFR CKD-EPI (2021) Male 53 (L) >=60 mL/min/1.11m2    Glucose 143 70 - 179 mg/dL    Calcium 9.3 8.5 - 09.6 mg/dL    Albumin 4.6 3.5 - 5.0 g/dL    Total Protein 7.9 6.0 - 8.0 g/dL    Total Bilirubin 0.8 0.3 - 1.2 mg/dL    AST 25 15 - 40 U/L    ALT 37 12 - 78 U/L    Alkaline Phosphatase 112 46 - 116 U/L   CBC w/ Differential   Result Value Ref Range    WBC 16.8 (H) 4.0 - 10.5 10*9/L    RBC 5.91 (H) 4.10 - 5.60 10*12/L    HGB 17.0 12.5 - 17.0 g/dL    HCT 04.5 40.9 - 81.1 %    MCV 83.9 80.0 - 98.0 fL    MCH 28.8 27.0 - 34.0 pg    MCHC 34.3 32.0 - 36.0 g/dL    RDW 91.4 78.2 - 95.6 %    MPV 9.7 7.4 - 10.4 fL    Platelet 330 140 - 415 10*9/L    Neutrophils % 91.3 %    Lymphocytes % 3.5 %    Monocytes % 4.4 %    Eosinophils % 0.1 %    Basophils % 0.2 %    Absolute Neutrophils 15.3 (H) 1.8 - 7.8 10*9/L    Absolute Lymphocytes 0.6 (L) 0.7 - 4.5 10*9/L    Absolute Monocytes 0.7 0.1 - 1.0 10*9/L    Absolute Eosinophils 0.0 0.0 - 0.4 10*9/L    Absolute Basophils 0.0 0.0 - 0.2 10*9/L       Radiology  No results found.     Medical Decision Making     I have reviewed  the vital signs and the nursing notes. Labs and radiology results that were available during my care of the patient were independently reviewed by me and considered in my medical decision making.       This is a 63 year old male who comes in with abdominal distention and discomfort in the suprapubic area relieved immediately after placing a Foley catheter with 700 mL of urine.  He on examination has no tenderness no pulsating mass no CVA tenderness.  Subjectively he has no nausea vomiting.  No fever chills subjectively.  His vitals are stable.  His creatinine is elevated at 1.5.  He has a history of lupus nephritis and has been taking hydroxychloroquine and CellCept.    Medical Decision Making       Differential Diagnosis: Enlarged prostate, BPH, UTI,        ED Clinical Impression     Final diagnoses:   Urinary (tract) obstruction (Primary)         Procedures           This record has been created using [employer]. Chart creation errors have been sought, but may not always have been located. Such creation errors do not reflect on the standard of medical care.     Renald Carol, MD  09/02/23 1640

## 2023-09-05 ENCOUNTER — Other Ambulatory Visit: Payer: Self-pay

## 2023-09-05 DIAGNOSIS — R21 Rash and other nonspecific skin eruption: Secondary | ICD-10-CM

## 2023-09-05 MED ORDER — TRIAMCINOLONE ACETONIDE 0.5 % EX OINT: 1.0000 | TOPICAL_OINTMENT | Freq: Two times a day (BID) | CUTANEOUS | 0 refills | Status: DC | PRN

## 2023-09-05 NOTE — Addendum Note (Signed)
 Addended by: Matt Song on: 09/05/2023 12:56 PM   Modules accepted: Orders

## 2023-09-05 NOTE — Telephone Encounter (Signed)
 Last Fill: 07/23/2023  Next Visit: 10/24/2023  Last Visit: 07/23/2023  Dx: SLE (systemic lupus erythematosus related syndrome)   Current Dose per office note on 07/23/2023: triamcinolone  cream for topical use twice daily   Okay to refill Triamcinolone  Cream?

## 2023-09-05 NOTE — Progress Notes (Signed)
 Opened in error

## 2023-09-10 MED FILL — BENLYSTA 200 MG/ML SUBCUTANEOUS AUTO-INJECTOR: SUBCUTANEOUS | 28 days supply | Qty: 4 | Fill #1

## 2023-10-02 NOTE — Unmapped (Signed)
 [facility] Refill Coordination Note    Specialty Medication(s) to be Shipped:   Inflammatory Disorders: Benlysta     Other medication(s) to be shipped: No additional medications requested for fill at this time     Miguel Sharp, DOB: December 30, 1960  Phone: [phone] (home) [phone] (work)      All above HIPAA information was verified with patient.     Was a Nurse, learning disability used for this call? No    Completed refill call assessment today to schedule patient's medication shipment from the [facility]  [phone]).  All relevant notes have been reviewed.     Specialty medication(s) and dose(s) confirmed: Regimen is correct and unchanged.   Changes to medications: Miguel Sharp reports no changes at this time.  Changes to insurance: No  New side effects reported not previously addressed with a pharmacist or physician: None reported  Questions for the pharmacist: No    Confirmed patient received a Conservation officer, historic buildings and a Surveyor, mining with first shipment. The patient will receive a drug information handout for each medication shipped and additional FDA Medication Guides as required.       DISEASE/MEDICATION-SPECIFIC INFORMATION        For patients on injectable medications: Patient currently has 2 doses left.  Next injection is scheduled for 10/05/23.    SPECIALTY MEDICATION ADHERENCE     Medication Adherence    Patient reported X missed doses in the last month: 0  Specialty Medication: Benlysta  200 mg/mL  Patient is on additional specialty medications: No  Informant: patient     Were doses missed due to medication being on hold? No    Benlysta  200 mg/mL: 2 doses of medicine on hand       REFERRAL TO PHARMACIST     Referral to the pharmacist: Not needed      [facility]     Shipping address confirmed in Epic.     Cost and Payment: Patient has a $0 copay, payment information is not required.    Delivery Scheduled: Yes, Expected medication delivery date: 10/16/23.     Medication will be delivered via UPS to the prescription address in Epic [place].    Miguel Sharp   [facility]  Specialty Technician

## 2023-10-15 ENCOUNTER — Ambulatory Visit: Admitting: Urology

## 2023-10-15 VITALS — BP 109/69 | HR 61

## 2023-10-15 DIAGNOSIS — R972 Elevated prostate specific antigen [PSA]: Secondary | ICD-10-CM

## 2023-10-15 DIAGNOSIS — N400 Enlarged prostate without lower urinary tract symptoms: Secondary | ICD-10-CM | POA: Diagnosis not present

## 2023-10-15 DIAGNOSIS — R3912 Poor urinary stream: Secondary | ICD-10-CM

## 2023-10-15 DIAGNOSIS — R339 Retention of urine, unspecified: Secondary | ICD-10-CM | POA: Diagnosis not present

## 2023-10-15 LAB — URINALYSIS, ROUTINE W REFLEX MICROSCOPIC
Bilirubin, UA: NEGATIVE
Glucose, UA: NEGATIVE
Ketones, UA: NEGATIVE
Leukocytes,UA: NEGATIVE
Nitrite, UA: NEGATIVE
Specific Gravity, UA: 1.02 (ref 1.005–1.030)
Urobilinogen, Ur: 2 mg/dL — ABNORMAL HIGH (ref 0.2–1.0)
pH, UA: 6.5 (ref 5.0–7.5)

## 2023-10-15 LAB — MICROSCOPIC EXAMINATION
Bacteria, UA: NONE SEEN
WBC, UA: NONE SEEN /HPF (ref 0–5)

## 2023-10-15 LAB — BLADDER SCAN AMB NON-IMAGING: Scan Result: 0

## 2023-10-15 MED FILL — BENLYSTA 200 MG/ML SUBCUTANEOUS AUTO-INJECTOR: SUBCUTANEOUS | 28 days supply | Qty: 4 | Fill #2

## 2023-10-15 NOTE — Progress Notes (Signed)
 10/15/2023 9:33 AM   Gerald Lloyd [birth year] [id number]  Referring provider: Sebastian Beverley NOVAK, MD [address] [place],  [place] [address]  No chief complaint on file.   HPI: New pt -    1) BPH - pt developed painful inability to void May 2025. A foley was placed and over 700 ml drained. No NG. H/o CKD with Cr 1.5, gfr 53. Renal US  2022 - no hydro. Prostate not specifically imaged but around 20 g.   2) PSA elevation - His May 2025 PSA 14.24 but checked at ED when a foley catheter was placed for urinary retention and dysuria.   Today, Will seen for the above. IPSS 14. UA clear. PVR 0. On tamsulosin. He followed up with urology in [place]. Passed VT. PSA repeated and was 3.9 with a high percent free.   H/o Lupus nephritis. H/o DVT. On eliquis . Off cellcept. On hydrochlorochinolone.   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   PMH: Past Medical History:  Diagnosis Date   Anemia    DVT (deep venous thrombosis) (HCC)    Hypertension     Surgical History: Past Surgical History:  Procedure Laterality Date   IR THORACENTESIS ASP PLEURAL SPACE W/IMG GUIDE  11/25/2020   IR THORACENTESIS ASP PLEURAL SPACE W/IMG GUIDE  11/26/2020    Home Medications:  Allergies as of 10/15/2023   No Known Allergies      Medication List        Accurate as of October 15, 2023  9:33 AM. If you have any questions, ask your nurse or doctor.          Benlysta  200 MG/ML Soaj Generic drug: Belimumab  Inject the contents of 1 auto-injector (200 mg total) under the skin every seven (7) days.   Eliquis  5 MG Tabs tablet Generic drug: apixaban  Take 1 tablet (5 mg total) by mouth 2 (two) times daily.   furosemide  80 MG tablet Commonly known as: LASIX  Take 80 mg by mouth as needed.   hydroxychloroquine  200 MG tablet Commonly known as: PLAQUENIL  TAKE 1 TABLET BY MOUTH EVERY DAY   lisinopril 2.5 MG tablet Commonly known as: ZESTRIL Take 2.5 mg by mouth daily. Patient takes half tablet daily.    mycophenolate 500 MG tablet Commonly known as: CELLCEPT Take 500 mg by mouth 2 (two) times daily. X 1 week, 2nd week 2 tabs in the morning and 2 tabs in evening. 3rd week 3 tabs each morning and 3 each evening.   tamsulosin 0.4 MG Caps capsule Commonly known as: FLOMAX Take 0.4 mg by mouth daily.   triamcinolone  ointment 0.5 % Commonly known as: KENALOG  Apply 1 Application topically 2 (two) times daily as needed.        Allergies: No Known Allergies  Family History: Family History  Problem Relation Age of Onset   Breast cancer Mother    Colon cancer Mother    Diabetes Father    Healthy Son    Healthy Son     Social History:  reports that he has never smoked. He has never been exposed to tobacco smoke. He has never used smokeless tobacco. He reports current alcohol use. He reports that he does not use drugs.   Physical Exam: BP 109/69   Pulse 61   Constitutional:  Alert and oriented, No acute distress. HEENT: [place] AT, moist mucus membranes.  Trachea midline, no masses. Cardiovascular: No clubbing, cyanosis, or edema. Respiratory: Normal respiratory effort, no increased work of breathing. GI: Abdomen  is soft, nontender, nondistended, no abdominal masses GU: No CVA tenderness Lymph: No cervical or inguinal lymphadenopathy. Skin: No rashes, bruises or suspicious lesions. Neurologic: Grossly intact, no focal deficits, moving all 4 extremities. Psychiatric: Normal mood and affect.  Laboratory Data: Lab Results  Component Value Date   WBC 7.8 05/31/2023   HGB 15.2 05/31/2023   HCT 47 05/31/2023   MCV 88.2 02/26/2023   PLT 299 05/31/2023    Lab Results  Component Value Date   CREATININE 1.29 07/23/2023    No results found for: PSA  No results found for: TESTOSTERONE  No results found for: HGBA1C  Urinalysis    Component Value Date/Time   COLORURINE YELLOW 11/24/2020 1731   APPEARANCEUR HAZY (A) 11/24/2020 1731   LABSPEC 1.016 11/24/2020 1731    PHURINE 6.0 11/24/2020 1731   GLUCOSEU NEGATIVE 11/24/2020 1731   HGBUR MODERATE (A) 11/24/2020 1731   BILIRUBINUR NEGATIVE 11/24/2020 1731   KETONESUR NEGATIVE 11/24/2020 1731   PROTEINUR 100 (A) 11/24/2020 1731   NITRITE NEGATIVE 11/24/2020 1731   LEUKOCYTESUR NEGATIVE 11/24/2020 1731    Lab Results  Component Value Date   BACTERIA RARE (A) 11/24/2020    Pertinent Imaging:   Results for orders placed during the hospital encounter of 11/23/20  US  RENAL  Narrative CLINICAL DATA:  AKI  EXAM: RENAL / URINARY TRACT ULTRASOUND COMPLETE  COMPARISON:  None.  FINDINGS: Right Kidney:  Renal measurements: 13.2 x 5.2 x 5.8 cm = volume: 206 mL. Echogenicity within normal limits. No mass or hydronephrosis visualized.  Left Kidney:  Renal measurements: 11.8 x 6.8 x 5.5 cm = volume: 232 mL. Echogenicity within normal limits. No mass or hydronephrosis visualized.  Bladder:  Appears normal for degree of bladder distention.  Other:  None.  IMPRESSION: Normal renal ultrasound.   Electronically Signed By: Donald Campion M.D. On: 11/25/2020 15:02     Assessment & Plan:    1. BPH - disc tams, 5ari, and procedures. Used Haematologist. Discussed pUS and cystoscopy.  - Urinalysis, Routine w reflex microscopic - BLADDER SCAN AMB NON-IMAGING   No follow-ups on file.  Donnice Brooks, MD  [facility]  [address] [place], [place] [address] [phone]

## 2023-10-15 NOTE — Progress Notes (Signed)
 Bladder Scan completed today.  Patient can void prior to the bladder scan. Bladder scan result: 0  Performed By: Gwendolyn Grant T. CMA  Additional notes-

## 2023-10-18 ENCOUNTER — Encounter: Payer: Self-pay | Admitting: Internal Medicine

## 2023-10-18 ENCOUNTER — Telehealth: Payer: Self-pay

## 2023-10-18 DIAGNOSIS — R21 Rash and other nonspecific skin eruption: Secondary | ICD-10-CM

## 2023-10-18 MED ORDER — PREDNISONE 10 MG PO TABS: ORAL_TABLET | ORAL | 0 refills | Status: AC

## 2023-10-18 MED ORDER — TRIAMCINOLONE ACETONIDE 0.5 % EX OINT: 1.0000 | TOPICAL_OINTMENT | Freq: Two times a day (BID) | CUTANEOUS | 0 refills | Status: DC | PRN
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
## 2023-10-18 NOTE — Telephone Encounter (Signed)
 Contacted patient to advise. I do not think this is related to his medications, unless related to too much sun exposure while on the hydroxychloroquine . For now I will send a 5 day oral prednisone  taper. I will also send a refill for the triamcinolone  topical steroid he can apply on affected areas twice daily until improving. He should also keep the areas covered with clothing or some kind of sunscreen if spending much time outdoors.  Patient stated that he is going to the beach next week, advised he apply sunscreen or wear a long sleeve shirt. Verbalized understanding.

## 2023-10-18 NOTE — Telephone Encounter (Signed)
 Duplicate

## 2023-10-18 NOTE — Telephone Encounter (Signed)
 I do not think this is related to his medications, unless related to too much sun exposure while on the hydroxychloroquine . For now I will send a 5 day oral prednisone  taper. I will also send a refill for the triamcinolone  topical steroid he can apply on affected areas twice daily until improving. He should also keep the areas covered with clothing or some kind of sunscreen if spending much time outdoors.

## 2023-10-24 ENCOUNTER — Ambulatory Visit: Admitting: Internal Medicine

## 2023-11-06 ENCOUNTER — Telehealth: Payer: Self-pay

## 2023-11-06 ENCOUNTER — Other Ambulatory Visit: Payer: Self-pay | Admitting: Internal Medicine

## 2023-11-06 DIAGNOSIS — M329 Systemic lupus erythematosus, unspecified: Secondary | ICD-10-CM

## 2023-11-06 DIAGNOSIS — Z79899 Other long term (current) drug therapy: Secondary | ICD-10-CM

## 2023-11-06 DIAGNOSIS — M3214 Glomerular disease in systemic lupus erythematosus: Secondary | ICD-10-CM

## 2023-11-06 MED ORDER — BENLYSTA 200 MG/ML SUBCUTANEOUS AUTO-INJECTOR
SUBCUTANEOUS | 0 refills | 28.00000 days
Start: 2023-11-06 — End: ?

## 2023-11-06 MED ORDER — BELIMUMAB 200 MG/ML SUBCUTANEOUS AUTO-INJECTOR
2 refills | 0.00000 days
Start: 2023-11-06 — End: ?

## 2023-11-06 NOTE — Unmapped (Signed)
 [facility] Specialty and Home Delivery Pharmacy Refill Coordination Note    Specialty Medication(s) to be Shipped:   Inflammatory Disorders: Benlysta     Other medication(s) to be shipped: No additional medications requested for fill at this time     Miguel Sharp, DOB: 1960-05-04  Phone: [phone] (home) [phone] (work)      All above HIPAA information was verified with patient.     Was a Nurse, learning disability used for this call? No    Completed refill call assessment today to schedule patient's medication shipment from the [facility] and [facility] Delivery Pharmacy  [phone]).  All relevant notes have been reviewed.     Specialty medication(s) and dose(s) confirmed: Regimen is correct and unchanged.   Changes to medications: Miguel Sharp reports starting the following medications: flomax   Changes to insurance: No  New side effects reported not previously addressed with a pharmacist or physician: None reported  Questions for the pharmacist: No    Confirmed patient received a Conservation officer, historic buildings and a Surveyor, mining with first shipment. The patient will receive a drug information handout for each medication shipped and additional FDA Medication Guides as required.       DISEASE/MEDICATION-SPECIFIC INFORMATION        For patients on injectable medications: Patient currently has 1 doses left.  Next injection is scheduled for 11/09/2023.    SPECIALTY MEDICATION ADHERENCE     Medication Adherence    Patient reported X missed doses in the last month: 0  Specialty Medication: BENLYSTA  200 mg/mL Atin (belimumab )  Patient is on additional specialty medications: No              Were doses missed due to medication being on hold? No    BENLYSTA  200 mg/mL Atin (belimumab )  1 doses of medicine on hand     REFERRAL TO PHARMACIST     Referral to the pharmacist: Not needed      [facility]     Shipping address confirmed in Epic.     Cost and Payment: Patient has a $0 copay, payment information is not required.    Delivery Scheduled: Yes, Expected medication delivery date: 11/09/2023 .  However, Rx request for refills was sent to the provider as there are none remaining.     Medication will be delivered via UPS to the prescription address in Epic WAM.    [facility]uel Sharp [facility] Pharmacy  Specialty Technician

## 2023-11-06 NOTE — Telephone Encounter (Signed)
 Patient called to get a refill on his medication, advised it was in the box to be filled. Patient verbalized understanding

## 2023-11-06 NOTE — Telephone Encounter (Signed)
 Last Fill: 07/23/2023  Labs: 09/02/2023  WBC 16.8 RBC 5.91 Absolute Neutro 15.3 Absolute Lymphocytes 0.6 Potassium 3.4 Anion Gap 15 BUN 21 Creatinie 1.51 eGFR 53   TB Gold: 02/26/2023 TB Gold Negative   Next Visit: 11/21/2023  Last Visit: 07/23/2023  DX:SLE   Current Dose per office note 07/23/2022: Benlysta  200 mg subcu weekly   Okay to refill Benlysta ?

## 2023-11-08 MED FILL — BENLYSTA 200 MG/ML SUBCUTANEOUS AUTO-INJECTOR: SUBCUTANEOUS | 28 days supply | Qty: 4 | Fill #0
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
## 2023-11-21 ENCOUNTER — Encounter: Payer: Self-pay | Admitting: Internal Medicine

## 2023-11-21 ENCOUNTER — Ambulatory Visit: Attending: Internal Medicine | Admitting: Internal Medicine

## 2023-11-21 VITALS — BP 107/68 | HR 60 | Resp 14 | Ht 71.0 in | Wt 181.0 lb

## 2023-11-21 DIAGNOSIS — M329 Systemic lupus erythematosus, unspecified: Secondary | ICD-10-CM

## 2023-11-21 DIAGNOSIS — Z79899 Other long term (current) drug therapy: Secondary | ICD-10-CM

## 2023-11-21 DIAGNOSIS — M3214 Glomerular disease in systemic lupus erythematosus: Secondary | ICD-10-CM | POA: Diagnosis not present

## 2023-11-21 MED ORDER — HYDROXYCHLOROQUINE SULFATE 200 MG PO TABS
200.0000 mg | ORAL_TABLET | Freq: Every day | ORAL | 1 refills | Status: AC
Start: 2023-11-21 — End: ?

## 2023-11-21 MED ORDER — BENLYSTA 200 MG/ML ~~LOC~~ SOAJ
SUBCUTANEOUS | 2 refills | Status: DC
Start: 2023-11-21 — End: 2024-03-04

## 2023-11-21 MED ORDER — TRIAMCINOLONE ACETONIDE 0.1 % EX CREA: 1.0000 | TOPICAL_CREAM | Freq: Two times a day (BID) | CUTANEOUS | 0 refills | Status: DC | PRN

## 2023-11-21 MED ORDER — BENLYSTA 200 MG/ML SUBCUTANEOUS AUTO-INJECTOR
SUBCUTANEOUS | 2 refills | 28.00000 days
Start: 2023-11-21 — End: ?

## 2023-11-28 NOTE — Unmapped (Signed)
 [facility] Specialty and Home Delivery Pharmacy Refill Coordination Note    Specialty Medication(s) to be Shipped:   Inflammatory Disorders: Benlysta     Other medication(s) to be shipped: No additional medications requested for fill at this time    Specialty Medications not needed at this time: N/A     Miguel Sharp, DOB: [birth year]  Phone: [phone] (home) [phone] (work)      All above HIPAA information was verified with patient.     Was a Nurse, learning disability used for this call? No    Completed refill call assessment today to schedule patient's medication shipment from the [facility] and Home Delivery Pharmacy  [phone]).  All relevant notes have been reviewed.     Specialty medication(s) and dose(s) confirmed: Regimen is correct and unchanged.   Changes to medications: Avrom reports no changes at this time.  Changes to insurance: No  New side effects reported not previously addressed with a pharmacist or physician: None reported  Questions for the pharmacist: No    Confirmed patient received a Conservation officer, historic buildings and a Surveyor, mining with first shipment. The patient will receive a drug information handout for each medication shipped and additional FDA Medication Guides as required.       DISEASE/MEDICATION-SPECIFIC INFORMATION        For patients on injectable medications: Next injection is scheduled for 11/30/2023.    SPECIALTY MEDICATION ADHERENCE     Medication Adherence    Patient reported X missed doses in the last month: 0  Specialty Medication: BENLYSTA  200 mg/mL Atin (belimumab )  Patient is on additional specialty medications: No              Were doses missed due to medication being on hold? No     BENLYSTA  200 mg/mL Atin (belimumab ): 2 doses of medicine on hand       REFERRAL TO PHARMACIST     Referral to the pharmacist: Not needed      [facility]     Shipping address confirmed in Epic.     Cost and Payment: Patient has a $0 copay, payment information is not required.    Delivery Scheduled: Yes, Expected medication delivery date: 12/05/2023.     Medication will be delivered via UPS to the prescription address in Epic WAM.    Lucie CHRISTELLA Forts   [facility] Specialty and Home Delivery Pharmacy  Specialty Technician

## 2023-12-04 MED FILL — BENLYSTA 200 MG/ML SUBCUTANEOUS AUTO-INJECTOR: SUBCUTANEOUS | 28 days supply | Qty: 4 | Fill #0

## 2023-12-25 NOTE — Unmapped (Signed)
 [facility] Specialty and Home Delivery Pharmacy Refill Coordination Note    Specialty Medication(s) to be Shipped:   Inflammatory Disorders: Benlysta     Other medication(s) to be shipped: No additional medications requested for fill at this time    Specialty Medications not needed at this time: N/A     Miguel Sharp, DOB: Nov 28, 1960  Phone: [phone] (home) [phone] (work)      All above HIPAA information was verified with patient.     Was a Nurse, learning disability used for this call? No    Completed refill call assessment today to schedule patient's medication shipment from the [facility] and Home Delivery Pharmacy  [phone]).  All relevant notes have been reviewed.     Specialty medication(s) and dose(s) confirmed: Regimen is correct and unchanged.   Changes to medications: Miguel Sharp reports no changes at this time.  Changes to insurance: No  New side effects reported not previously addressed with a pharmacist or physician: None reported  Questions for the pharmacist: No    Confirmed patient received a Conservation officer, historic buildings and a Surveyor, mining with first shipment. The patient will receive a drug information handout for each medication shipped and additional FDA Medication Guides as required.       DISEASE/MEDICATION-SPECIFIC INFORMATION        For patients on injectable medications: Next injection is scheduled for 12/28/23.    SPECIALTY MEDICATION ADHERENCE     Medication Adherence    Patient reported X missed doses in the last month: 0  Specialty Medication: BENLYSTA  200 mg/mL Atin (belimumab )  Patient is on additional specialty medications: No  Patient is on more than two specialty medications: No  Any gaps in refill history greater than 2 weeks in the last 3 months: no  Demonstrates understanding of importance of adherence: yes  Informant: patient  Confirmed plan for next specialty medication refill: delivery by pharmacy  Refills needed for supportive medications: not needed          Refill Coordination    Has the Patients' Contact Information Changed: No  Is the Shipping Address Different: No         Were doses missed due to medication being on hold? No    BENLYSTA  200   mg/ml: 1 doses of medicine on hand       REFERRAL TO PHARMACIST     Referral to the pharmacist: Not needed      [facility]     Shipping address confirmed in Epic.     Cost and Payment: Patient has a $0 copay, payment information is not required.    Delivery Scheduled: Yes, Expected medication delivery date: 01/01/24.     Medication will be delivered via UPS to the prescription address in Epic WAM.    Miguel Sharp   [facility] Specialty and Home Delivery Pharmacy  Specialty Technician

## 2023-12-31 MED FILL — BENLYSTA 200 MG/ML SUBCUTANEOUS AUTO-INJECTOR: SUBCUTANEOUS | 28 days supply | Qty: 4 | Fill #1

## 2024-01-28 NOTE — Unmapped (Signed)
 [facility] Specialty and Home Delivery Pharmacy Refill Coordination Note    Specialty Medication(s) to be Shipped:   Inflammatory Disorders: Benlysta     Other medication(s) to be shipped: No additional medications requested for fill at this time    Specialty Medications not needed at this time: N/A     Miguel Sharp, DOB: 11-29-1960  Phone: [phone] (home) [phone] (work)      All above HIPAA information was verified with patient.     Was a Nurse, learning disability used for this call? No    Completed refill call assessment today to schedule patient's medication shipment from the [facility] Delivery Pharm[facility]  [phone]).  All relevant notes have been reviewed.     Specialty medication(s) and dose(s) confirmed: Regimen is correct and unchanged.   Changes to medications: Miguel Sharp reports no changes at this time.  Changes to insurance: No  New side effects reported not previously addressed with a pharmacist or physician: None reported  Questions for the pharmacist: No    Confirmed patient received a Conservation officer, historic buildings and a Surveyor, mining with first shipment. The patient will receive a drug information handout for each medication shipped and additional FDA Medication Guides as required.       DISEASE/MEDICATION-SPECIFIC INFORMATION        For patients on injectable medications: Next injection is scheduled for 02/01/24.    SPECIALTY MEDICATION ADHERENCE     Medication Adherence    Patient reported X missed doses in the last month: 0  Specialty Medication: BENLYSTA  200 mg/mL Atin (belimumab )  Patient is on additional specialty medications: No  Patient is on more than two specialty medications: No  Any gaps in refill history greater than 2 weeks in the last 3 months: no  Demonstrates understanding of importance of adherence: yes  Informant: patient  Confirmed plan for next specialty medication refill: delivery by pharmacy  Refills needed for supportive medications: not needed          Refill Coordination    Has the Patients' Contact Information Changed: No  Is the Shipping Address Different: No         Were doses missed due to medication being on hold? No    BENLYSTA  200   mg/ml: 1 doses of medicine on hand       REFERRAL TO PHARMACIST     Referral to the pharmacist: Not needed      [facility]     Shipping address confirmed in Epic.     Cost and Payment: Patient has a $0 copay, payment information is not required.    Delivery Scheduled: Yes, Expected medication delivery date: 02/05/24.     Medication will be delivered via UPS to the prescription address in Epic [place].    Miguel Sharp   [facility] and [facility] Delivery Pharm[facility]  Specialty Technician

## 2024-02-05 MED FILL — BENLYSTA 200 MG/ML SUBCUTANEOUS AUTO-INJECTOR: SUBCUTANEOUS | 28 days supply | Qty: 4 | Fill #2

## 2024-02-19 NOTE — Progress Notes (Signed)
 Office Visit Note  Patient: Gerald Lloyd             Date of Birth: [birth year]           MRN: [id number]             PCP: Sebastian Beverley NOVAK, MD Referring: Sebastian Beverley NOVAK, MD Visit Date: 03/04/2024   Subjective:    Discussed the use of AI scribe software for clinical note transcription with the patient, who gave verbal consent to proceed.  History of Present Illness   Gerald Lloyd is a 63 y.o. male here for follow up for SLE with class IV/V nephritis on hydroxychloroquine  200 mg daily and Benlysta  200 mg subcu weekly.   He has been managing his lupus nephritis with hydroxychloroquine  once daily and Benlysta  injections weekly without any reported problem. His double-stranded DNA antibody levels, which had previously decreased to almost negative, have been gradually increasing over the past year.  No recent illnesses requiring antibiotics, viral infections, or new joint pain. He reports typical cold and allergy symptoms but no leg swelling.  He reports experiencing dry skin and occasional itching, which he manages with moisturizing products. He uses the shower nozzle to scratch his face or other areas when dry skin occurs.  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   Previous HPI 11/21/2023 Gerald Lloyd is a 63 y.o. male here for follow up for SLE with class IV/V nephritis on hydroxychloroquine  200 mg daily and Benlysta  200 mg subcu weekly.   He has been experiencing a persistent rash affecting his arms and legs mostly on the backs of both forearms. The rash is tender and sometimes blistery.. He has been applying a cream to the affected areas without significant relief. He has not consulted a dermatologist specifically for this issue, although he has seen one in the past for other concerns.   He mentions a possible connection between the rash and his lupus, although  he is uncertain. He has not been exposed to the sun extensively, except for a recent beach trip where he wore protective clothing and stayed in the shade.   His current medications include hydroxychloroquine  once a day, Benlysta  once a week, lisinopril half a pill once a day, and Flomax once a day. He is not currently taking prednisone .  He had recent labs checked on August 8 with nephrology clinic with urine protein decreased to a trace level, normal serum complements, persistent positive double-stranded DNA at 83.   No swelling in his feet or ankles and no other new symptoms.      Previous HPI 07/23/2023 Gerald Lloyd is a 63 y.o. male here for follow up for SLE with class IV/V nephritis on hydroxychloroquine  200 mg, mycophenolate 500 mg twice daily, Benlysta  200 mg subcu weekly, and prednisone  5 mg daily.     He has been gradually tapering off mycophenolate, initially taking two doses twice a week last month, and now taking one dose twice a day. He has had an excellent response to the medications and is currently on a reduced dose of mycophenolate tapering monthly now at 500 mg BID this month. He was previously taken off prednisone  after November   No recent illnesses or upper respiratory infections. He experiences seasonal allergies due to pollen but has not yet started taking Claritin D this season.   He has a patchy rash on his leg, which  he describes as similar to contact dermatitis or poison oak exposure. It has been present for a couple of weeks without itching or pain. He has been using over-the-counter hydrocortisone cream on the area without resolution so far.      Previous HPI 02/26/2023 Gerald Lloyd is a 63 y.o. male here for follow up for SLE with class IV/V nephritis on hydroxychloroquine  200 mg, mycophenolate 1500 mg twice daily, Benlysta  200 mg subcu weekly, and prednisone  5 mg daily.  Most recent labs were collected in September Dr. Marlee including urine protein  creatinine ratio that was completely normal.  He has had some intermittent upper respiratory symptoms viral infection versus allergies but did not require antibiotics and is doing well today.   Previous HPI 08/22/2022 Gerald Lloyd is a 63 y.o. male here for follow up for SLE with class IV/V nephritis on hydroxychloroquine  200 mg, mycophenolate 1500 mg twice daily, Benlysta  200 mg subcu weekly, and prednisone  5 mg daily.  Since her last visit he has had no significant change in symptoms.  Still notices very easy bruising and spots appearing on his forearms and shins otherwise no new issues.  Discussed possible medication changes with Dr. Jimmy with upcoming appointment in the next few months with his low blood pressure he does get some lightheadedness or dizziness if standing up too quickly.   Previous HPI 05/22/22 Gerald Lloyd is a 64 y.o. male here for follow up for SLE with class IV/V nephritis on HCQ 200 mg, MMF 1500 mg BID, benlysta  200 mg [place] weekly, and prednisone  5 mg daily. He has not experienced any increase in symptoms. No significant leg swelling no shortness of breath or large weight change. He has easy bruising on eliquis  treatment. He saw Dr. Marlee last week for follow up with labs from 2 weeks ago. No change in treatment recommended based on renal parameters anticipates aiming for 2 years of treatment response before tapering.   Previous HPI 01/06/22 Gerald Lloyd is a 63 y.o. male here for follow up for systemic lupus with lupus nephritis currently on HCQ 200 mg daily MF 1500 mg BID benlysta  200 mg [place] weekly, and prednisone  5 mg daily. He is feeling well today without any new complaints. 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 He remains on eliquis  5 mg BID for anticoagulation. He anticipates next labs visit in about 2 weeks before his follow up with Dr. Marlee. He reports seeing [facility] in [place] with exam for HCQ  monitoring which was fine.   Previous HPI 09/23/2021 Gerald Lloyd is a 63 y.o. male here for follow up for systemic lupus with lupus nephritis on HCQ 400 mg daily, MMF 1500 mg BID, Benlysta  200 mg [place] weekly, prednisone  5 mg daily. He is doing well overall without major interval events. He started on low dose lisinopril for continued proteinuria.   Previous HPI 06/24/2021 Gerald Lloyd is a 63 y.o. male here for follow up for systemic lupus with lupus nephritis on HCQ 400 mg daily, cellcept 1500 mg BID, Benlysta  200 mg [place] weekly, prednisone  5 mg daily. He started benlysta  with induction dose in January due partial renal response. He has not noticed any problems taking the Benlysta . Leg swelling is doing well.  He has not had any shortness of breath or chest pain.  He does report episodes of burping during the past month these are not every day but are very noticeable when they occur.  He  is not having any other problems such as nausea diarrhea abdominal pain or acid reflux.  He had follow-up with Dr. Marlee in the past month reports findings indicated continue to trend in the right direction but still not a complete renal response.   Previous HPI 03/24/21 Gerald Lloyd is a 63 y.o. male here for follow up for systemic lupus with class IV/V nephritis now on HCQ 400 mg daily, cellcept 1500 mg BID, and prednisone  5 mg daily. Most recent follow up with Dr. Marlee with continued proteinuria partially improving.  He continues having mild bilateral leg swelling no other symptom complaints.   Previous HPI 12/22/20 Gerald Lloyd is a 63 y.o. male with history of lower extremity DVT on eliquis  anticoagulation here for new diagnosis systemic lupus.  In February he was evaluated for unilateral left leg swelling with ultrasound exam finding a DVT in the left popliteal vein unprovoked.  There was ongoing local swelling of the left calf for several months.  He then presented to the hospital due to  shortness of breath.  Work-up identified bilateral pleural effusions, large circumferential pericardial effusion with tamponade and volume overload, lupus nephritis. He was started with pulse dose steroids, cellcept, and hydroxychloroquine .  And headache very good improvement after starting the high-dose steroids.  He has followed up for this with Dr. Marlee at [facility] on induction therapy for lupus nephritis.  He currently feels that most of his symptoms are much improved he does have a generalized weakness and decreased exertion tolerance ever since his hospital stay. He denies any particular skin rashes, alopecia, arthritis, oral ulcers, or Raynaud's symptoms.  He does not know of any family history of autoimmune disease.  He has never experienced similar symptoms prior to these events.   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  HENT:  Negative for mouth sores and mouth dryness.   Eyes:  Negative for dryness.  Respiratory:  Negative for shortness of breath.   Cardiovascular:  Negative for chest pain and palpitations.  Gastrointestinal:  Negative for blood in stool, constipation and diarrhea.  Endocrine: Negative for increased urination.  Genitourinary:  Negative for involuntary urination.  Musculoskeletal:  Positive for morning stiffness. Negative for joint pain, gait problem, joint pain, joint swelling, myalgias, muscle weakness, muscle tenderness and myalgias.  Skin:  Negative for color change, rash, hair loss and sensitivity to sunlight.  Allergic/Immunologic: Negative for susceptible to infections.  Neurological:  Negative  for dizziness and headaches.  Hematological:  Negative for swollen glands.  Psychiatric/Behavioral:  Negative for depressed mood and sleep disturbance. The patient is not nervous/anxious.     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/27/2020   Bilateral pleural effusion 11/25/2020   AKI (acute kidney injury) 11/24/2020   Pericardial effusion 11/24/2020   Anemia 11/24/2020   Dyspnea 11/24/2020   Essential hypertension 11/24/2020   Acute on chronic diastolic CHF (congestive heart failure) (HCC) 11/23/2020  Past Medical History:  Diagnosis Date   Anemia    DVT (deep venous thrombosis) (HCC)    Hypertension     Family History  Problem Relation Age of Onset   Breast cancer Mother    Colon cancer Mother    Diabetes Father    Healthy Son    Healthy Son    Past Surgical History:  Procedure Laterality Date   IR THORACENTESIS ASP PLEURAL SPACE W/IMG GUIDE  11/25/2020   IR THORACENTESIS ASP PLEURAL SPACE W/IMG GUIDE  11/26/2020   Social History   Social History Narrative   Not on file   Immunization History  Administered Date(s) Administered   Moderna Sars-Covid-2 Vaccination 07/30/2019, 12/10/2019, 03/25/2020     Objective: Vital Signs: BP (!) 98/58   Pulse 63   Temp (!) 97.4 F (36.3 C)   Resp 16   Ht 5' 11 (1.803 m)   Wt 186 lb 1.6 oz (84.4 kg)   BMI 25.96 kg/m    Physical Exam Eyes:     Conjunctiva/sclera: Conjunctivae normal.  Cardiovascular:     Rate and Rhythm: Normal rate and regular rhythm.  Pulmonary:     Effort: Pulmonary effort is normal.     Breath sounds: Normal breath sounds.  Lymphadenopathy:     Cervical: No cervical adenopathy.  Skin:    General: Skin is warm and dry.     Findings: Rash present.      Comments: Erythematous rash with few excoriations on b/l anterior shins, dry skin changes  Neurological:     Mental Status: He is alert.  Psychiatric:        Mood and Affect: Mood normal.      Musculoskeletal Exam:  Shoulders full ROM no tenderness or swelling Elbows full ROM no tenderness or swelling Wrists full ROM no tenderness or swelling Fingers full ROM no tenderness or swelling Knees full ROM no tenderness or swelling Ankles full ROM no tenderness or swelling  Investigation: No additional findings.  Imaging: No results found.  Recent Labs: Lab Results  Component Value Date   WBC 8.4 03/04/2024   HGB 16.2 03/04/2024   PLT 302 03/04/2024   NA 136 03/04/2024   K 5.0 03/04/2024   CL 100 03/04/2024   CO2 28 03/04/2024   GLUCOSE 99 03/04/2024   BUN 21 03/04/2024   CREATININE 1.15 03/04/2024   BILITOT 0.5 03/04/2024   ALKPHOS 95 05/31/2023   AST 29 03/04/2024   ALT 33 03/04/2024   PROT 6.5 03/04/2024   ALBUMIN 4.4 11/14/2022   CALCIUM 9.1 03/04/2024   QFTBGOLDPLUS NEGATIVE 03/04/2024    Speciality Comments: PLQ Eye Exam: 04/02/2023 normal [facility], [place], normal f/u 6 months  Procedures:  No procedures performed Allergies: Patient has no known allergies.   Assessment / Plan:     Visit Diagnoses: SLE (systemic lupus erythematosus related syndrome) (HCC) - Plan: Belimumab  (BENLYSTA ) 200 MG/ML SOAJ, Sedimentation rate, C3 and C4, Anti-DNA antibody, double-stranded Continues to appear to appear to be in low disease activity clinically despite persistent elevated double-stranded DNA antibody titer.  This has crept up progressively from 61 early in the year to 93s and now into the low 80s. The current rashes look more like atopic dermatitis with some excoriations. Will check renal function parameters as well anticipation of appointment with Dr. Marlee soon. - Checkign C3 C4, dsDNA, sed rate disease activity monitoring - Continue hydroxychloroquine  200 mg daily -  Continue Benlysta  200 mg subcu weekly  High risk medication  use - Hydroxychloroquine  200 mg, Benlysta  200 mg subcu weekly - Plan: Belimumab  (BENLYSTA ) 200 MG/ML SOAJ, CBC with Differential/Platelet, Comprehensive metabolic panel with GFR, QuantiFERON-TB Gold Plus, No serious interval infections. - Checking CBC and CMP for medication monitoring on methotrexate - Checking annual TB screening for Benlysta   Lupus nephritis (HCC) - Plan: Belimumab  (BENLYSTA ) 200 MG/ML SOAJ, Protein / creatinine ratio, urine, Urinalysis, Routine w reflex microscopic - Checking UPC ratio, UA   Orders: Orders Placed This Encounter  Procedures   MICROSCOPIC MESSAGE   Sedimentation rate   C3 and C4   CBC with Differential/Platelet   Comprehensive metabolic panel with GFR   Anti-DNA antibody, double-stranded   Protein / creatinine ratio, urine   Urinalysis, Routine w reflex microscopic   QuantiFERON-TB Gold Plus   Meds ordered this encounter  Medications   Belimumab  (BENLYSTA ) 200 MG/ML SOAJ    Sig: Inject the contents of 1 auto-injector (200 mg total) under the skin every seven (7) days.    Dispense:  4 mL    Refill:  2    MAINTENANCE DOSE     Follow-Up Instructions: Return in about 3 months (around 06/04/2024) for SLE/LN on HCQ/BLY f/u 3mos.   Lonni LELON Ester, MD  Note - This record has been created using [employer].  Chart creation errors have been sought, but may not always  have been located. Such creation errors do not reflect on  the standard of medical care.

## 2024-02-26 ENCOUNTER — Ambulatory Visit: Admitting: Internal Medicine

## 2024-02-29 ENCOUNTER — Other Ambulatory Visit: Payer: Self-pay | Admitting: Internal Medicine

## 2024-02-29 DIAGNOSIS — M3214 Glomerular disease in systemic lupus erythematosus: Secondary | ICD-10-CM

## 2024-02-29 DIAGNOSIS — Z79899 Other long term (current) drug therapy: Secondary | ICD-10-CM

## 2024-02-29 DIAGNOSIS — M329 Systemic lupus erythematosus, unspecified: Secondary | ICD-10-CM

## 2024-02-29 MED ORDER — BELIMUMAB 200 MG/ML SUBCUTANEOUS AUTO-INJECTOR
SUBCUTANEOUS | 2 refills | 28.00000 days
Start: 2024-02-29 — End: ?

## 2024-02-29 NOTE — Progress Notes (Signed)
 [facility] Specialty and Home Delivery Pharmacy Refill Coordination Note    Specialty Medication(s) to be Shipped:   Inflammatory Disorders: Benlysta     Other medication(s) to be shipped: No additional medications requested for fill at this time    Specialty Medications not needed at this time: N/A     Miguel Sharp, DOB: Jun 09, 1960  Phone: [phone] (home) [phone] (work)      All above HIPAA information was verified with patient.     Was a nurse, learning disability used for this call? No    Completed refill call assessment today to schedule patient's medication shipment from the [facility] and [facility] Delivery Pharm[facility]  [phone]).  All relevant notes have been reviewed.     Specialty medication(s) and dose(s) confirmed: Regimen is correct and unchanged.   Changes to medications: Miguel Sharp reports no changes at this time.  Changes to insurance: No  New side effects reported not previously addressed with a pharmacist or physician: None reported  Questions for the pharmacist: No    Confirmed patient received a Conservation Officer, Historic Buildings and a Surveyor, Mining with first shipment. The patient will receive a drug information handout for each medication shipped and additional FDA Medication Guides as required.       DISEASE/MEDICATION-SPECIFIC INFORMATION        For patients on injectable medications: Next injection is scheduled for 03/07/24.    SPECIALTY MEDICATION ADHERENCE     Medication Adherence    Patient reported X missed doses in the last month: 0  Specialty Medication: Benlysta  200 mg/mL  Patient is on additional specialty medications: No  Informant: patient     Were doses missed due to medication being on hold? No    Benlysta  200 mg/ml: 0 doses of medicine on hand       REFERRAL TO PHARMACIST     Referral to the pharmacist: Not needed      [facility]     Shipping address confirmed in Epic.     Cost and Payment: Patient has a $0 copay, payment information is not required.    Delivery Scheduled: Yes, Expected medication delivery date: 03/05/24.  However, Rx request for refills was sent to the provider as there are none remaining.     Medication will be delivered via UPS to the prescription address in Epic [place].    Miguel Sharp   [place] [facility]  Specialty Technician

## 2024-02-29 NOTE — Progress Notes (Signed)
 Miguel Sharp 's Benlysta  shipment will be canceled as a result of refills denied by provider     I have reached out to the patient  at [phone] and communicated the delay. We will not reschedule the medication and have removed this/these medication(s) from the work request.  We have canceled this work request.

## 2024-02-29 NOTE — Telephone Encounter (Signed)
 Patient called to check on meds, advised it was pended to his upcoming appointment since he has not had labs in 6 months.

## 2024-03-04 ENCOUNTER — Encounter: Payer: Self-pay | Admitting: Internal Medicine

## 2024-03-04 ENCOUNTER — Ambulatory Visit: Attending: Internal Medicine | Admitting: Internal Medicine

## 2024-03-04 VITALS — BP 98/58 | HR 63 | Temp 97.4°F | Resp 16 | Ht 71.0 in | Wt 186.1 lb

## 2024-03-04 DIAGNOSIS — M329 Systemic lupus erythematosus, unspecified: Secondary | ICD-10-CM | POA: Diagnosis not present

## 2024-03-04 DIAGNOSIS — R21 Rash and other nonspecific skin eruption: Secondary | ICD-10-CM | POA: Diagnosis not present

## 2024-03-04 DIAGNOSIS — M3214 Glomerular disease in systemic lupus erythematosus: Secondary | ICD-10-CM | POA: Diagnosis not present

## 2024-03-04 DIAGNOSIS — Z111 Encounter for screening for respiratory tuberculosis: Secondary | ICD-10-CM

## 2024-03-04 DIAGNOSIS — Z9225 Personal history of immunosupression therapy: Secondary | ICD-10-CM

## 2024-03-04 DIAGNOSIS — Z79899 Other long term (current) drug therapy: Secondary | ICD-10-CM | POA: Diagnosis not present

## 2024-03-04 MED ORDER — BENLYSTA 200 MG/ML ~~LOC~~ SOAJ: SUBCUTANEOUS | 2 refills | Status: DC

## 2024-03-04 MED ORDER — BENLYSTA 200 MG/ML SUBCUTANEOUS AUTO-INJECTOR
2 refills | 0.00000 days
Start: 2024-03-04 — End: ?

## 2024-03-05 NOTE — Progress Notes (Signed)
 Miguel Sharp 's Benlysta  shipment will be rescheduled as a result of received a new script     I have reached out to the patient  via incoming phone call and communicated the delivery change. We will reschedule the medication for the delivery date that the patient agreed upon.  We have confirmed the delivery date as 12/02, via ups.

## 2024-03-06 LAB — COMPREHENSIVE METABOLIC PANEL WITH GFR
AG Ratio: 2.1 (calc) (ref 1.0–2.5)
ALT: 33 U/L (ref 9–46)
AST: 29 U/L (ref 10–35)
Albumin: 4.4 g/dL (ref 3.6–5.1)
Alkaline phosphatase (APISO): 92 U/L (ref 35–144)
BUN: 21 mg/dL (ref 7–25)
CO2: 28 mmol/L (ref 20–32)
Calcium: 9.1 mg/dL (ref 8.6–10.3)
Chloride: 100 mmol/L (ref 98–110)
Creat: 1.15 mg/dL (ref 0.70–1.35)
Globulin: 2.1 g/dL (ref 1.9–3.7)
Glucose, Bld: 99 mg/dL (ref 65–99)
Potassium: 5 mmol/L (ref 3.5–5.3)
Sodium: 136 mmol/L (ref 135–146)
Total Bilirubin: 0.5 mg/dL (ref 0.2–1.2)
Total Protein: 6.5 g/dL (ref 6.1–8.1)
eGFR: 72 mL/min/1.73m2 (ref >=60)

## 2024-03-06 LAB — URINALYSIS, ROUTINE W REFLEX MICROSCOPIC
Bacteria, UA: NONE SEEN /HPF
Bilirubin Urine: NEGATIVE
Glucose, UA: NEGATIVE
Hgb urine dipstick: NEGATIVE
Hyaline Cast: NONE SEEN /LPF
Ketones, ur: NEGATIVE
Leukocytes,Ua: NEGATIVE
Nitrite: NEGATIVE
Specific Gravity, Urine: 1.021 (ref 1.001–1.035)
Squamous Epithelial / HPF: NONE SEEN /HPF
WBC, UA: NONE SEEN /HPF (ref 0–5)
pH: 7.5 (ref 5.0–8.0)

## 2024-03-06 LAB — CBC WITH DIFFERENTIAL/PLATELET
Absolute Lymphocytes: 882 {cells}/uL (ref 850–3900)
Absolute Monocytes: 857 {cells}/uL (ref 200–950)
Basophils Absolute: 42 {cells}/uL (ref 0–200)
Basophils Relative: 0.5 %
Eosinophils Absolute: 168 {cells}/uL (ref 15–500)
Eosinophils Relative: 2 %
HCT: 48.8 % (ref 39.4–51.1)
Hemoglobin: 16.2 g/dL (ref 13.2–17.1)
MCH: 28.9 pg (ref 27.0–33.0)
MCHC: 33.2 g/dL (ref 31.6–35.4)
MCV: 87 fL (ref 81.4–101.7)
MPV: 10.3 fL (ref 7.5–12.5)
Monocytes Relative: 10.2 %
Neutro Abs: 6451 {cells}/uL (ref 1500–7800)
Neutrophils Relative %: 76.8 %
Platelets: 302 Thousand/uL (ref 140–400)
RBC: 5.61 Million/uL (ref 4.20–5.80)
RDW: 12.7 % (ref 11.0–15.0)
Total Lymphocyte: 10.5 %
WBC: 8.4 Thousand/uL (ref 3.8–10.8)

## 2024-03-06 LAB — SEDIMENTATION RATE: Sed Rate: 2 mm/h (ref 0–20)

## 2024-03-06 LAB — QUANTIFERON-TB GOLD PLUS
Mitogen-NIL: 8.52 [IU]/mL
NIL: 0.01 [IU]/mL
QuantiFERON-TB Gold Plus: NEGATIVE
TB1-NIL: 0 [IU]/mL
TB2-NIL: 0 [IU]/mL

## 2024-03-06 LAB — PROTEIN / CREATININE RATIO, URINE
Creatinine, Urine: 137 mg/dL (ref 20–320)
Protein/Creat Ratio: 146 mg/g{creat} (ref 25–148)
Protein/Creatinine Ratio: 0.146 mg/mg{creat} (ref 0.025–0.148)
Total Protein, Urine: 20 mg/dL (ref 5–25)

## 2024-03-06 LAB — ANTI-DNA ANTIBODY, DOUBLE-STRANDED: ds DNA Ab: 89 [IU]/mL — ABNORMAL HIGH

## 2024-03-06 LAB — C3 AND C4
C3 Complement: 85 mg/dL (ref 82–185)
C4 Complement: 12 mg/dL — ABNORMAL LOW (ref 15–53)

## 2024-03-06 LAB — MICROSCOPIC MESSAGE

## 2024-03-10 MED FILL — BENLYSTA 200 MG/ML SUBCUTANEOUS AUTO-INJECTOR: SUBCUTANEOUS | 28 days supply | Qty: 4 | Fill #0

## 2024-03-27 ENCOUNTER — Other Ambulatory Visit

## 2024-03-27 DIAGNOSIS — N400 Enlarged prostate without lower urinary tract symptoms: Secondary | ICD-10-CM

## 2024-03-28 ENCOUNTER — Ambulatory Visit: Payer: Self-pay

## 2024-03-28 LAB — PSA: Prostate Specific Ag, Serum: 3.9 ng/mL (ref 0.0–4.0)

## 2024-03-31 NOTE — Progress Notes (Signed)
 [facility] Specialty and Home Delivery Pharmacy Refill Coordination Note    Specialty Medication(s) to be Shipped:   Inflammatory Disorders: Benlysta     Other medication(s) to be shipped: No additional medications requested for fill at this time    Specialty Medications not needed at this time: N/A     Miguel Sharp, DOB: Oct 11, 1960  Phone: [phone] (home) [phone] (work)      All above HIPAA information was verified with patient.     Was a nurse, learning disability used for this call? No    Completed refill call assessment today to schedule patient's medication shipment from the [facility] and Home Delivery Pharmacy  [phone]).  All relevant notes have been reviewed.     Specialty medication(s) and dose(s) confirmed: Regimen is correct and unchanged.   Changes to medications: [name] reports no changes at this time.  Changes to insurance: No  New side effects reported not previously addressed with a pharmacist or physician: None reported  Questions for the pharmacist: No    Confirmed patient received a Conservation Officer, Historic Buildings and a Surveyor, Mining with first shipment. The patient will receive a drug information handout for each medication shipped and additional FDA Medication Guides as required.       DISEASE/MEDICATION-SPECIFIC INFORMATION        N/A    SPECIALTY MEDICATION ADHERENCE     Medication Adherence    Patient reported X missed doses in the last month: 0  Specialty Medication: belimumab : BENLYSTA  200 mg/mL Atin  Patient is on additional specialty medications: No              Were doses missed due to medication being on hold? No      belimumab : BENLYSTA  200 mg/mL Atin: 1 doses of medicine on hand       Specialty medication is an injection or given on a cycle: Yes, Next injection is scheduled for 12.26.25.    REFERRAL TO PHARMACIST     Referral to the pharmacist: Not needed      [facility]     Shipping address confirmed in Epic.     Cost and Payment: Patient has a $0 copay, payment information is not required.    Delivery Scheduled: Yes, Expected medication delivery date: 12.30.25.     Medication will be delivered via UPS to the prescription address in Epic [facility].    [name]   [facility] Special[facility] and Home Delivery Pharmacy  Specialty Technician

## 2024-04-06 ENCOUNTER — Other Ambulatory Visit: Payer: Self-pay | Admitting: Internal Medicine

## 2024-04-06 DIAGNOSIS — M329 Systemic lupus erythematosus, unspecified: Secondary | ICD-10-CM

## 2024-04-07 ENCOUNTER — Ambulatory Visit: Admitting: Urology

## 2024-04-07 ENCOUNTER — Encounter: Payer: Self-pay | Admitting: Urology

## 2024-04-07 VITALS — BP 105/68 | HR 62

## 2024-04-07 DIAGNOSIS — N401 Enlarged prostate with lower urinary tract symptoms: Secondary | ICD-10-CM

## 2024-04-07 DIAGNOSIS — N400 Enlarged prostate without lower urinary tract symptoms: Secondary | ICD-10-CM

## 2024-04-07 DIAGNOSIS — R972 Elevated prostate specific antigen [PSA]: Secondary | ICD-10-CM

## 2024-04-07 DIAGNOSIS — R3912 Poor urinary stream: Secondary | ICD-10-CM

## 2024-04-07 MED ORDER — TAMSULOSIN HCL 0.4 MG PO CAPS: 0.4000 mg | ORAL_CAPSULE | Freq: Every day | ORAL | 3 refills | Status: AC

## 2024-04-07 MED FILL — BENLYSTA 200 MG/ML SUBCUTANEOUS AUTO-INJECTOR: SUBCUTANEOUS | 28 days supply | Qty: 4 | Fill #1

## 2024-04-07 NOTE — Telephone Encounter (Signed)
 Last Fill: 11/21/2023  Next Visit: 06/16/2024  Last Visit: 03/04/2024   DX: SLE (systemic lupus erythematosus related syndrome)   Current Dose per office note on 03/04/2024: not mentioned.   Okay to refill triamcinolone  cream?

## 2024-04-07 NOTE — Progress Notes (Unsigned)
 "  04/07/2024 4:04 PM   Gerald Lloyd 1960-12-27 [id number]  Referring provider: Sebastian Beverley NOVAK, MD [address] [place],  [place] [address]  No chief complaint on file.   HPI:  F/u -     1) BPH - pt developed painful inability to void May 2025. A foley was placed and over 700 ml drained. No NG. H/o CKD with Cr 1.5, gfr 53. Renal US  2022 - no hydro. Prostate not specifically imaged but around 20 g.    2) PSA elevation - His May 2025 PSA 14.24 but checked at ED when a foley catheter was placed for urinary retention and dysuria. PSS 14. UA clear. PVR 0. On tamsulosin. He followed up with urology in [place]. Passed VT. PSA repeated and was 3.9 with a high percent free.    04/07/2024: Today, Will is seen for the above. Dec 2025 PSA 3.9. On tamsulosin. No gross hematuria.    H/o Lupus nephritis. H/o DVT. On eliquis . Off cellcept. On hydrochlorochinolone.    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   PMH: Past Medical History:  Diagnosis Date   Anemia    DVT (deep venous thrombosis) (HCC)    Hypertension     Surgical History: Past Surgical History:  Procedure Laterality Date   IR THORACENTESIS ASP PLEURAL SPACE W/IMG GUIDE  11/25/2020   IR THORACENTESIS ASP PLEURAL SPACE W/IMG GUIDE  11/26/2020    Home Medications:  Allergies as of 04/07/2024   No Known Allergies      Medication List        Accurate as of April 07, 2024  4:04 PM. If you have any questions, ask your nurse or doctor.          Benlysta  200 MG/ML Soaj Generic drug: Belimumab  Inject the contents of 1 auto-injector (200 mg total) under the skin every seven (7) days.   Eliquis  5 MG Tabs tablet Generic drug: apixaban  Take 1 tablet (5 mg total) by mouth 2 (two) times daily.   furosemide  80 MG tablet Commonly known as: LASIX  Take 80 mg by mouth as needed.   hydroxychloroquine  200 MG tablet Commonly known as: PLAQUENIL  Take 1 tablet (200 mg total) by mouth daily.   lisinopril 2.5 MG  tablet Commonly known as: ZESTRIL Take 2.5 mg by mouth daily. Patient takes half tablet daily.   tamsulosin 0.4 MG Caps capsule Commonly known as: FLOMAX Take 0.4 mg by mouth daily.   triamcinolone  cream 0.1 % Commonly known as: KENALOG  APPLY 1 APPLICATION TOPICALLY TWICE A DAY AS NEEDED        Allergies: Allergies[1]  Family History: Family History  Problem Relation Age of Onset   Breast cancer Mother    Colon cancer Mother    Diabetes Father    Healthy Son    Healthy Son     Social History:  reports that he has never smoked. He has been exposed to tobacco smoke. He has never used smokeless tobacco. He reports current alcohol use. He reports that he does not use drugs.   Physical Exam: BP 105/68   Pulse 62   Constitutional:  Alert and oriented, No acute distress. HEENT: [place] AT, moist mucus membranes.  Trachea midline, no masses. Cardiovascular: No clubbing, cyanosis, or edema. Respiratory: Normal respiratory effort, no increased work of breathing. GI: Abdomen is soft, nontender, nondistended, no abdominal masses GU: No CVA tenderness Skin: No rashes, bruises or suspicious lesions. Neurologic: Grossly intact, no focal deficits, moving all 4 extremities. Psychiatric:  Normal mood and affect.  Laboratory Data: Lab Results  Component Value Date   WBC 8.4 03/04/2024   HGB 16.2 03/04/2024   HCT 48.8 03/04/2024   MCV 87.0 03/04/2024   PLT 302 03/04/2024    Lab Results  Component Value Date   CREATININE 1.15 03/04/2024    No results found for: PSA  No results found for: TESTOSTERONE  No results found for: HGBA1C  Urinalysis    Component Value Date/Time   COLORURINE YELLOW 03/04/2024 1614   APPEARANCEUR CLEAR 03/04/2024 1614   APPEARANCEUR Clear 10/15/2023 0902   LABSPEC 1.021 03/04/2024 1614   PHURINE 7.5 03/04/2024 1614   GLUCOSEU NEGATIVE 03/04/2024 1614   HGBUR NEGATIVE 03/04/2024 1614   BILIRUBINUR Negative 10/15/2023 0902   KETONESUR  NEGATIVE 03/04/2024 1614   PROTEINUR TRACE (A) 03/04/2024 1614   NITRITE NEGATIVE 03/04/2024 1614   LEUKOCYTESUR NEGATIVE 03/04/2024 1614    Lab Results  Component Value Date   LABMICR See below: 10/15/2023   WBCUA None seen 10/15/2023   LABEPIT 0-10 10/15/2023   BACTERIA NONE SEEN 03/04/2024    Pertinent Imaging:   Results for orders placed during the hospital encounter of 11/23/20  US  RENAL  Narrative CLINICAL DATA:  AKI  EXAM: RENAL / URINARY TRACT ULTRASOUND COMPLETE  COMPARISON:  None.  FINDINGS: Right Kidney:  Renal measurements: 13.2 x 5.2 x 5.8 cm = volume: 206 mL. Echogenicity within normal limits. No mass or hydronephrosis visualized.  Left Kidney:  Renal measurements: 11.8 x 6.8 x 5.5 cm = volume: 232 mL. Echogenicity within normal limits. No mass or hydronephrosis visualized.  Bladder:  Appears normal for degree of bladder distention.  Other:  None.  IMPRESSION: Normal renal ultrasound.   Electronically Signed By: Donald Campion M.D. On: 11/25/2020 15:02  No results found for this or any previous visit.  No results found for this or any previous visit.  No results found for this or any previous visit.   Assessment & Plan:    1. Elevated PSA (Primary) PSA remains at a normal level but higher side. Discussed etiology - benign vs PCa and risk stratification. Discussed mRI vs surveillane. Prefers surveillance- check PSA in 6 mo  - Urinalysis, Routine w reflex microscopic; Future - Urinalysis, Routine w reflex microscopic  2. BPH - tamsulosin refilled.   No follow-ups on file.  Donnice Brooks, MD  [facility]  [address] [place], [place] [address] [phone]      [1] No Known Allergies  "

## 2024-04-08 LAB — URINALYSIS, ROUTINE W REFLEX MICROSCOPIC
Bilirubin, UA: NEGATIVE
Glucose, UA: NEGATIVE
Ketones, UA: NEGATIVE
Leukocytes,UA: NEGATIVE
Nitrite, UA: NEGATIVE
Specific Gravity, UA: 1.02 (ref 1.005–1.030)
Urobilinogen, Ur: 1 mg/dL (ref 0.2–1.0)
pH, UA: 6 (ref 5.0–7.5)

## 2024-04-08 LAB — MICROSCOPIC EXAMINATION
Bacteria, UA: NONE SEEN
WBC, UA: NONE SEEN /HPF (ref 0–5)

## 2024-05-09 NOTE — Progress Notes (Signed)
 [facility] Refill Coordination Note    Specialty Medication(s) to be Shipped:   Inflammatory Disorders: Benlysta     Other medication(s) to be shipped: No additional medications requested for fill at this time    Specialty Medications not needed at this time: N/A     Miguel Sharp, DOB: [birth year]  Phone: [phone] (home) [phone] (work)      All above HIPAA information was verified with patient.     Was a nurse, learning disability used for this call? No    Completed refill call assessment today to schedule patient's medication shipment from the [facility]  [phone]).  All relevant notes have been reviewed.     Specialty medication(s) and dose(s) confirmed: Regimen is correct and unchanged.   Changes to medications: Miguel Sharp reports no changes at this time.  Changes to insurance: No  New side effects reported not previously addressed with a pharmacist or physician: None reported  Questions for the pharmacist: No    Confirmed patient received a Conservation Officer, Historic Buildings and a Surveyor, Mining with first shipment. The patient will receive a drug information handout for each medication shipped and additional FDA Medication Guides as required.       DISEASE/MEDICATION-SPECIFIC INFORMATION        N/A    SPECIALTY MEDICATION ADHERENCE              Were doses missed due to medication being on hold? No    Benlysta  200 mg/ml: 0 doses of medicine on hand     Specialty medication is an injection or given on a cycle: Yes, Next injection is scheduled for 05/15/2024.    REFERRAL TO PHARMACIST     Referral to the pharmacist: Not needed      [name]     Shipping address confirmed in Epic.     Cost and Payment: Patient has a $0 copay, payment information is not required.    Delivery Scheduled: Yes, Expected medication delivery date: 05/14/2024.     Medication will be delivered via UPS to the prescription address in Epic WAM.    Miguel Sharp, PharmD   [facility]  Specialty Pharmacist

## 2024-05-13 MED FILL — BENLYSTA 200 MG/ML SUBCUTANEOUS AUTO-INJECTOR: 28 days supply | Qty: 4 | Fill #2

## 2024-05-16 ENCOUNTER — Other Ambulatory Visit: Payer: Self-pay | Admitting: Internal Medicine

## 2024-05-16 DIAGNOSIS — Z79899 Other long term (current) drug therapy: Secondary | ICD-10-CM

## 2024-05-16 DIAGNOSIS — M3214 Glomerular disease in systemic lupus erythematosus: Secondary | ICD-10-CM

## 2024-05-16 DIAGNOSIS — M329 Systemic lupus erythematosus, unspecified: Secondary | ICD-10-CM

## 2024-05-16 NOTE — Telephone Encounter (Signed)
 Last Fill: 03/04/2024  Labs: 03/04/2024 CBC and CMP WNL   TB Gold: 03/04/2024 TB Gold Negative    Next Visit: 06/16/2024  Last Visit: 03/04/2024  DX: SLE (systemic lupus erythematosus related syndrome)   Current Dose per office note 03/04/2024: Benlysta  200 mg subcu weekly   Okay to refill Benlysta  ?

## 2024-06-16 ENCOUNTER — Ambulatory Visit: Admitting: Internal Medicine

## 2024-10-06 ENCOUNTER — Other Ambulatory Visit

## 2024-10-13 ENCOUNTER — Ambulatory Visit: Admitting: Urology
